# Patient Record
Sex: Female | Born: 1975 | Race: White | Hispanic: No | Marital: Married | Smoking: Former smoker
Health system: Southern US, Community
[De-identification: ages and names within clinical notes are randomized; demographics above are authoritative.]

## PROBLEM LIST (undated history)

## (undated) DIAGNOSIS — F329 Major depressive disorder, single episode, unspecified: Secondary | ICD-10-CM

## (undated) DIAGNOSIS — F32A Depression, unspecified: Secondary | ICD-10-CM

## (undated) DIAGNOSIS — Z973 Presence of spectacles and contact lenses: Secondary | ICD-10-CM

## (undated) DIAGNOSIS — R51 Headache: Secondary | ICD-10-CM

## (undated) DIAGNOSIS — F419 Anxiety disorder, unspecified: Secondary | ICD-10-CM

## (undated) DIAGNOSIS — M549 Dorsalgia, unspecified: Secondary | ICD-10-CM

## (undated) DIAGNOSIS — I498 Other specified cardiac arrhythmias: Secondary | ICD-10-CM

## (undated) DIAGNOSIS — I441 Atrioventricular block, second degree: Secondary | ICD-10-CM

## (undated) DIAGNOSIS — M797 Fibromyalgia: Secondary | ICD-10-CM

## (undated) DIAGNOSIS — G510 Bell's palsy: Secondary | ICD-10-CM

## (undated) DIAGNOSIS — R519 Headache, unspecified: Secondary | ICD-10-CM

## (undated) DIAGNOSIS — K219 Gastro-esophageal reflux disease without esophagitis: Secondary | ICD-10-CM

## (undated) DIAGNOSIS — R569 Unspecified convulsions: Secondary | ICD-10-CM

## (undated) DIAGNOSIS — G8929 Other chronic pain: Secondary | ICD-10-CM

## (undated) DIAGNOSIS — R001 Bradycardia, unspecified: Secondary | ICD-10-CM

## (undated) DIAGNOSIS — Z8719 Personal history of other diseases of the digestive system: Secondary | ICD-10-CM

## (undated) HISTORY — DX: Other specified cardiac arrhythmias: I49.8

## (undated) HISTORY — DX: Bradycardia, unspecified: R00.1

## (undated) HISTORY — PX: TOOTH EXTRACTION: SUR596

## (undated) HISTORY — DX: Atrioventricular block, second degree: I44.1

## (undated) HISTORY — PX: DILATION AND CURETTAGE OF UTERUS: SHX78

---

## 1999-07-01 ENCOUNTER — Other Ambulatory Visit: Admission: RE | Admit: 1999-07-01 | Discharge: 1999-07-01 | Payer: Self-pay | Admitting: Obstetrics and Gynecology

## 1999-09-03 ENCOUNTER — Inpatient Hospital Stay (HOSPITAL_COMMUNITY): Admission: AD | Admit: 1999-09-03 | Discharge: 1999-09-05 | Payer: Self-pay | Admitting: Obstetrics and Gynecology

## 1999-09-06 ENCOUNTER — Encounter (HOSPITAL_COMMUNITY): Admission: RE | Admit: 1999-09-06 | Discharge: 1999-12-05 | Payer: Self-pay | Admitting: Obstetrics and Gynecology

## 1999-12-07 ENCOUNTER — Encounter: Admission: RE | Admit: 1999-12-07 | Discharge: 2000-04-29 | Payer: Self-pay | Admitting: Obstetrics and Gynecology

## 2000-05-05 ENCOUNTER — Encounter: Admission: RE | Admit: 2000-05-05 | Discharge: 2000-08-03 | Payer: Self-pay | Admitting: Obstetrics and Gynecology

## 2000-08-05 ENCOUNTER — Encounter: Admission: RE | Admit: 2000-08-05 | Discharge: 2000-11-03 | Payer: Self-pay | Admitting: Obstetrics and Gynecology

## 2000-11-05 ENCOUNTER — Encounter: Admission: RE | Admit: 2000-11-05 | Discharge: 2001-01-04 | Payer: Self-pay | Admitting: Obstetrics and Gynecology

## 2001-07-11 ENCOUNTER — Emergency Department (HOSPITAL_COMMUNITY): Admission: EM | Admit: 2001-07-11 | Discharge: 2001-07-11 | Payer: Self-pay | Admitting: Emergency Medicine

## 2001-07-27 ENCOUNTER — Other Ambulatory Visit: Admission: RE | Admit: 2001-07-27 | Discharge: 2001-07-27 | Payer: Self-pay | Admitting: Gynecology

## 2003-05-08 ENCOUNTER — Encounter: Payer: Self-pay | Admitting: Family Medicine

## 2003-05-08 ENCOUNTER — Ambulatory Visit (HOSPITAL_COMMUNITY): Admission: RE | Admit: 2003-05-08 | Discharge: 2003-05-08 | Payer: Self-pay | Admitting: Family Medicine

## 2003-05-20 ENCOUNTER — Encounter: Payer: Self-pay | Admitting: Family Medicine

## 2003-05-20 ENCOUNTER — Ambulatory Visit (HOSPITAL_COMMUNITY): Admission: RE | Admit: 2003-05-20 | Discharge: 2003-05-20 | Payer: Self-pay | Admitting: Family Medicine

## 2004-10-01 ENCOUNTER — Ambulatory Visit (HOSPITAL_COMMUNITY): Admission: RE | Admit: 2004-10-01 | Discharge: 2004-10-01 | Payer: Self-pay | Admitting: Family Medicine

## 2005-01-12 ENCOUNTER — Encounter (HOSPITAL_COMMUNITY): Admission: RE | Admit: 2005-01-12 | Discharge: 2005-02-11 | Payer: Self-pay | Admitting: Neurosurgery

## 2005-04-30 ENCOUNTER — Ambulatory Visit (HOSPITAL_COMMUNITY): Admission: RE | Admit: 2005-04-30 | Discharge: 2005-04-30 | Payer: Self-pay | Admitting: Family Medicine

## 2007-04-07 ENCOUNTER — Ambulatory Visit (HOSPITAL_COMMUNITY): Admission: RE | Admit: 2007-04-07 | Discharge: 2007-04-07 | Payer: Self-pay | Admitting: Unknown Physician Specialty
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
## 2012-03-03 ENCOUNTER — Other Ambulatory Visit (HOSPITAL_COMMUNITY): Payer: Self-pay | Admitting: Family Medicine

## 2012-03-03 ENCOUNTER — Ambulatory Visit (HOSPITAL_COMMUNITY)
Admission: RE | Admit: 2012-03-03 | Discharge: 2012-03-03 | Disposition: A | Discharge status: Home / Self Care | Payer: BC Managed Care – PPO | Source: Ambulatory Visit | Attending: Family Medicine | Admitting: Family Medicine

## 2012-03-03 DIAGNOSIS — M545 Low back pain, unspecified: Secondary | ICD-10-CM

## 2013-03-22 ENCOUNTER — Other Ambulatory Visit (HOSPITAL_COMMUNITY): Payer: Self-pay | Admitting: Family Medicine

## 2013-03-22 DIAGNOSIS — M545 Low back pain, unspecified: Secondary | ICD-10-CM

## 2013-03-23 ENCOUNTER — Ambulatory Visit (HOSPITAL_COMMUNITY)
Admission: RE | Admit: 2013-03-23 | Discharge: 2013-03-23 | Disposition: A | Discharge status: Home / Self Care | Payer: BC Managed Care – PPO | Source: Ambulatory Visit | Attending: Family Medicine | Admitting: Family Medicine

## 2013-03-23 DIAGNOSIS — M545 Low back pain, unspecified: Secondary | ICD-10-CM | POA: Insufficient documentation

## 2013-03-23 DIAGNOSIS — M5126 Other intervertebral disc displacement, lumbar region: Secondary | ICD-10-CM | POA: Insufficient documentation

## 2013-03-23 DIAGNOSIS — R209 Unspecified disturbances of skin sensation: Secondary | ICD-10-CM | POA: Insufficient documentation

## 2013-03-23 DIAGNOSIS — M79609 Pain in unspecified limb: Secondary | ICD-10-CM | POA: Insufficient documentation

## 2013-03-23 DIAGNOSIS — M5380 Other specified dorsopathies, site unspecified: Secondary | ICD-10-CM | POA: Insufficient documentation

## 2013-03-26 ENCOUNTER — Other Ambulatory Visit: Payer: Self-pay | Admitting: Neurosurgery

## 2013-03-26 ENCOUNTER — Encounter (HOSPITAL_COMMUNITY): Payer: Self-pay | Admitting: Pharmacy Technician

## 2013-03-29 ENCOUNTER — Encounter (HOSPITAL_COMMUNITY): Payer: Self-pay

## 2013-03-29 ENCOUNTER — Encounter (HOSPITAL_COMMUNITY)
Admission: RE | Admit: 2013-03-29 | Discharge: 2013-03-29 | Disposition: A | Discharge status: Home / Self Care | Payer: BC Managed Care – PPO | Source: Ambulatory Visit | Attending: Neurosurgery | Admitting: Neurosurgery

## 2013-03-29 HISTORY — DX: Gastro-esophageal reflux disease without esophagitis: K21.9

## 2013-03-29 HISTORY — DX: Anxiety disorder, unspecified: F41.9

## 2013-03-29 HISTORY — DX: Major depressive disorder, single episode, unspecified: F32.9

## 2013-03-29 HISTORY — DX: Depression, unspecified: F32.A

## 2013-03-29 LAB — CBC
HCT: 40.3 % (ref 36.0–46.0)
Hemoglobin: 13.9 g/dL (ref 12.0–15.0)
MCH: 32.4 pg (ref 26.0–34.0)
RBC: 4.29 MIL/uL (ref 3.87–5.11)

## 2013-03-29 LAB — HCG, SERUM, QUALITATIVE: Preg, Serum: NEGATIVE

## 2013-03-29 LAB — SURGICAL PCR SCREEN: MRSA, PCR: NEGATIVE

## 2013-03-29 NOTE — Pre-Procedure Instructions (Signed)
ARIYANNA OIEN  03/29/2013   Your procedure is scheduled on:  Friday, June 20th  Report to [facility] Stay [facility] at 0530 AM. Come to main entrance "A" and go to east elevators up to 3rd floor. Check in at short stay desk.  Call this number if you have problems the morning of surgery: [phone]   Remember:   Do not eat food or drink liquids after midnight.   Take these medicines the morning of surgery with A SIP OF WATER: xanax if needed, oxycodone if needed   Do not wear jewelry, make-up or nail polish.  Do not wear lotions, powders, or perfumes, deodorant.  Do not shave 48 hours prior to surgery. Men may shave face and neck.  Do not bring valuables to the hospital.  [facility] is not responsible for any belongings or valuables.  Contacts, dentures or bridgework may not be worn into surgery.  Leave suitcase in the car. After surgery it may be brought to your room.  For patients admitted to the hospital, checkout time is 11:00 AM the day of discharge.   Patients discharged the day of surgery will not be allowed to drive home.    Special Instructions: Shower using CHG 2 nights before surgery and the night before surgery.  If you shower the day of surgery use CHG.  Use special wash - you have one bottle of CHG for all showers.  You should use approximately 1/3 of the bottle for each shower.   Please read over the following fact sheets that you were given: Pain Booklet, Coughing and Deep Breathing, MRSA Information and Surgical Site Infection Prevention

## 2013-03-29 NOTE — Progress Notes (Signed)
Primary physician - dr. Michelle Nasuti - [place] No cardiologist No prior cardiac testing

## 2013-03-30 ENCOUNTER — Encounter (HOSPITAL_COMMUNITY): Payer: Self-pay | Admitting: *Deleted

## 2013-03-30 ENCOUNTER — Ambulatory Visit (HOSPITAL_COMMUNITY): Payer: BC Managed Care – PPO | Admitting: Anesthesiology

## 2013-03-30 ENCOUNTER — Observation Stay (HOSPITAL_COMMUNITY)
Admission: RE | Admit: 2013-03-30 | Discharge: 2013-03-31 | Disposition: A | Discharge status: Home / Self Care | Payer: BC Managed Care – PPO | Source: Ambulatory Visit | Attending: Neurosurgery | Admitting: Neurosurgery

## 2013-03-30 ENCOUNTER — Encounter (HOSPITAL_COMMUNITY)
Admission: RE | Disposition: A | Discharge status: Home / Self Care | Payer: Self-pay | Source: Ambulatory Visit | Attending: Neurosurgery

## 2013-03-30 ENCOUNTER — Ambulatory Visit (HOSPITAL_COMMUNITY): Admission: RE | Admit: 2013-03-30 | Payer: BC Managed Care – PPO | Source: Ambulatory Visit | Admitting: Neurosurgery

## 2013-03-30 ENCOUNTER — Encounter (HOSPITAL_COMMUNITY): Payer: Self-pay | Admitting: Anesthesiology

## 2013-03-30 ENCOUNTER — Encounter (HOSPITAL_COMMUNITY): Admission: RE | Payer: Self-pay | Source: Ambulatory Visit

## 2013-03-30 ENCOUNTER — Ambulatory Visit (HOSPITAL_COMMUNITY): Payer: BC Managed Care – PPO

## 2013-03-30 DIAGNOSIS — Z01812 Encounter for preprocedural laboratory examination: Secondary | ICD-10-CM | POA: Insufficient documentation

## 2013-03-30 DIAGNOSIS — Z79899 Other long term (current) drug therapy: Secondary | ICD-10-CM | POA: Insufficient documentation

## 2013-03-30 DIAGNOSIS — M51379 Other intervertebral disc degeneration, lumbosacral region without mention of lumbar back pain or lower extremity pain: Secondary | ICD-10-CM | POA: Insufficient documentation

## 2013-03-30 DIAGNOSIS — M47817 Spondylosis without myelopathy or radiculopathy, lumbosacral region: Secondary | ICD-10-CM | POA: Insufficient documentation

## 2013-03-30 DIAGNOSIS — M5126 Other intervertebral disc displacement, lumbar region: Principal | ICD-10-CM | POA: Insufficient documentation

## 2013-03-30 DIAGNOSIS — M5137 Other intervertebral disc degeneration, lumbosacral region: Secondary | ICD-10-CM | POA: Insufficient documentation

## 2013-03-30 HISTORY — DX: Bell's palsy: G51.0

## 2013-03-30 HISTORY — PX: LUMBAR LAMINECTOMY/DECOMPRESSION MICRODISCECTOMY: SHX5026

## 2013-03-30 SURGERY — LUMBAR LAMINECTOMY/DECOMPRESSION MICRODISCECTOMY 1 LEVEL
Anesthesia: General | Site: Back | Laterality: Bilateral | Wound class: Clean

## 2013-03-30 SURGERY — LUMBAR LAMINECTOMY/DECOMPRESSION MICRODISCECTOMY 1 LEVEL
Anesthesia: General | Laterality: Bilateral

## 2013-03-30 MED ORDER — OXYCODONE HCL 5 MG PO TABS
ORAL_TABLET | ORAL | Status: AC
  Filled 2013-03-30: qty 1

## 2013-03-30 MED ORDER — LACTATED RINGERS IV SOLN
INTRAVENOUS | Status: DC | PRN
  Administered 2013-03-30 (×2): via INTRAVENOUS

## 2013-03-30 MED ORDER — BACITRACIN 50000 UNITS IM SOLR
INTRAMUSCULAR | Status: AC
  Filled 2013-03-30: qty 1

## 2013-03-30 MED ORDER — ALPRAZOLAM 0.5 MG PO TABS: 0.5000 mg | ORAL_TABLET | Freq: Three times a day (TID) | ORAL | Status: DC | PRN

## 2013-03-30 MED ORDER — MAGNESIUM HYDROXIDE 400 MG/5ML PO SUSP: 30.0000 mL | Freq: Every day | ORAL | Status: DC | PRN

## 2013-03-30 MED ORDER — SODIUM CHLORIDE 0.9 % IV SOLN
INTRAVENOUS | Status: AC
  Filled 2013-03-30: qty 500

## 2013-03-30 MED ORDER — ACETAMINOPHEN 650 MG RE SUPP: 650.0000 mg | RECTAL | Status: DC | PRN

## 2013-03-30 MED ORDER — OXYCODONE HCL 5 MG PO TABS
5.0000 mg | ORAL_TABLET | Freq: Once | ORAL | Status: AC | PRN
  Administered 2013-03-30: 5 mg via ORAL

## 2013-03-30 MED ORDER — ROCURONIUM BROMIDE 100 MG/10ML IV SOLN
INTRAVENOUS | Status: DC | PRN
  Administered 2013-03-30: 50 mg via INTRAVENOUS

## 2013-03-30 MED ORDER — ACETAMINOPHEN 325 MG PO TABS: 650.0000 mg | ORAL_TABLET | ORAL | Status: DC | PRN

## 2013-03-30 MED ORDER — BUPIVACAINE HCL (PF) 0.5 % IJ SOLN
INTRAMUSCULAR | Status: DC | PRN
  Administered 2013-03-30: 10 mL

## 2013-03-30 MED ORDER — MORPHINE SULFATE 4 MG/ML IJ SOLN
4.0000 mg | INTRAMUSCULAR | Status: DC | PRN
  Administered 2013-03-30 (×2): 4 mg via INTRAMUSCULAR
  Filled 2013-03-30 (×2): qty 1

## 2013-03-30 MED ORDER — SODIUM CHLORIDE 0.9 % IJ SOLN: 3.0000 mL | INTRAMUSCULAR | Status: DC | PRN

## 2013-03-30 MED ORDER — LIDOCAINE-EPINEPHRINE 1 %-1:100000 IJ SOLN
INTRAMUSCULAR | Status: DC | PRN
  Administered 2013-03-30: 10 mL

## 2013-03-30 MED ORDER — HYDROXYZINE HCL 25 MG PO TABS: 50.0000 mg | ORAL_TABLET | ORAL | Status: DC | PRN

## 2013-03-30 MED ORDER — ACETAMINOPHEN 10 MG/ML IV SOLN
INTRAVENOUS | Status: AC
  Administered 2013-03-30: 1000 mg via INTRAVENOUS
  Filled 2013-03-30: qty 100

## 2013-03-30 MED ORDER — ONDANSETRON HCL 4 MG/2ML IJ SOLN
INTRAMUSCULAR | Status: DC | PRN
  Administered 2013-03-30 (×2): 4 mg via INTRAVENOUS

## 2013-03-30 MED ORDER — KCL IN DEXTROSE-NACL 20-5-0.45 MEQ/L-%-% IV SOLN
INTRAVENOUS | Status: DC
  Filled 2013-03-30 (×5): qty 1000

## 2013-03-30 MED ORDER — 0.9 % SODIUM CHLORIDE (POUR BTL) OPTIME
TOPICAL | Status: DC | PRN
  Administered 2013-03-30: 1000 mL

## 2013-03-30 MED ORDER — PROPOFOL INFUSION 10 MG/ML OPTIME
INTRAVENOUS | Status: DC | PRN
  Administered 2013-03-30: 25 ug/kg/min via INTRAVENOUS

## 2013-03-30 MED ORDER — ALUM & MAG HYDROXIDE-SIMETH 200-200-20 MG/5ML PO SUSP: 30.0000 mL | Freq: Four times a day (QID) | ORAL | Status: DC | PRN

## 2013-03-30 MED ORDER — ACETAMINOPHEN 10 MG/ML IV SOLN
INTRAVENOUS | Status: AC
  Filled 2013-03-30: qty 100

## 2013-03-30 MED ORDER — CYCLOBENZAPRINE HCL 10 MG PO TABS
10.0000 mg | ORAL_TABLET | Freq: Three times a day (TID) | ORAL | Status: DC | PRN
  Administered 2013-03-30 (×2): 10 mg via ORAL
  Filled 2013-03-30 (×2): qty 1

## 2013-03-30 MED ORDER — OXYCODONE HCL 5 MG/5ML PO SOLN: 5.0000 mg | Freq: Once | ORAL | Status: AC | PRN

## 2013-03-30 MED ORDER — KETOROLAC TROMETHAMINE 30 MG/ML IJ SOLN
30.0000 mg | Freq: Four times a day (QID) | INTRAMUSCULAR | Status: DC
  Administered 2013-03-30 – 2013-03-31 (×4): 30 mg via INTRAVENOUS
  Filled 2013-03-30 (×8): qty 1

## 2013-03-30 MED ORDER — SALINE SPRAY 0.65 % NA SOLN
2.0000 | NASAL | Status: DC | PRN
  Filled 2013-03-30: qty 44

## 2013-03-30 MED ORDER — TEMAZEPAM 15 MG PO CAPS
30.0000 mg | ORAL_CAPSULE | Freq: Every evening | ORAL | Status: DC | PRN
  Administered 2013-03-30: 30 mg via ORAL
  Filled 2013-03-30: qty 2

## 2013-03-30 MED ORDER — METHYLPREDNISOLONE ACETATE 80 MG/ML IJ SUSP
INTRAMUSCULAR | Status: DC | PRN
  Administered 2013-03-30: 80 mg

## 2013-03-30 MED ORDER — SODIUM CHLORIDE 0.9 % IJ SOLN
3.0000 mL | Freq: Two times a day (BID) | INTRAMUSCULAR | Status: DC
  Administered 2013-03-30 – 2013-03-31 (×2): 3 mL via INTRAVENOUS

## 2013-03-30 MED ORDER — SODIUM CHLORIDE 0.9 % IR SOLN
Status: DC | PRN
  Administered 2013-03-30: 08:00:00

## 2013-03-30 MED ORDER — PROMETHAZINE HCL 25 MG/ML IJ SOLN: 6.2500 mg | INTRAMUSCULAR | Status: DC | PRN

## 2013-03-30 MED ORDER — HYDROMORPHONE HCL PF 1 MG/ML IJ SOLN
0.2500 mg | INTRAMUSCULAR | Status: DC | PRN
  Administered 2013-03-30 (×4): 0.5 mg via INTRAVENOUS

## 2013-03-30 MED ORDER — NEOSTIGMINE METHYLSULFATE 1 MG/ML IJ SOLN
INTRAMUSCULAR | Status: DC | PRN
  Administered 2013-03-30: 3 mg via INTRAVENOUS

## 2013-03-30 MED ORDER — HYDROMORPHONE HCL PF 1 MG/ML IJ SOLN
INTRAMUSCULAR | Status: AC
  Filled 2013-03-30: qty 1

## 2013-03-30 MED ORDER — REWETTING DROPS SOLN: 2.0000 [drp] | Freq: Every day | Status: DC | PRN

## 2013-03-30 MED ORDER — PROPOFOL 10 MG/ML IV BOLUS
INTRAVENOUS | Status: DC | PRN
  Administered 2013-03-30: 200 mg via INTRAVENOUS

## 2013-03-30 MED ORDER — FENTANYL CITRATE 0.05 MG/ML IJ SOLN
INTRAMUSCULAR | Status: DC | PRN
  Administered 2013-03-30 (×3): 50 ug via INTRAVENOUS
  Administered 2013-03-30: 100 ug via INTRAVENOUS

## 2013-03-30 MED ORDER — FENTANYL CITRATE 0.05 MG/ML IJ SOLN
INTRAMUSCULAR | Status: AC
  Filled 2013-03-30: qty 2

## 2013-03-30 MED ORDER — CARISOPRODOL 350 MG PO TABS
350.0000 mg | ORAL_TABLET | Freq: Every evening | ORAL | Status: DC | PRN
  Administered 2013-03-30: 350 mg via ORAL
  Filled 2013-03-30: qty 1

## 2013-03-30 MED ORDER — ACETAMINOPHEN 10 MG/ML IV SOLN
1000.0000 mg | Freq: Four times a day (QID) | INTRAVENOUS | Status: DC
  Administered 2013-03-30 – 2013-03-31 (×3): 1000 mg via INTRAVENOUS
  Filled 2013-03-30 (×4): qty 100

## 2013-03-30 MED ORDER — MIDAZOLAM HCL 2 MG/2ML IJ SOLN: 1.0000 mg | INTRAMUSCULAR | Status: DC | PRN

## 2013-03-30 MED ORDER — KETOROLAC TROMETHAMINE 30 MG/ML IJ SOLN
INTRAMUSCULAR | Status: AC
  Filled 2013-03-30: qty 1

## 2013-03-30 MED ORDER — ONDANSETRON HCL 4 MG/2ML IJ SOLN: 4.0000 mg | Freq: Four times a day (QID) | INTRAMUSCULAR | Status: DC | PRN

## 2013-03-30 MED ORDER — MENTHOL 3 MG MT LOZG: 1.0000 | LOZENGE | OROMUCOSAL | Status: DC | PRN

## 2013-03-30 MED ORDER — SALINE NASAL SPRAY 0.65 % NA SOLN: 2.0000 | Freq: Every day | NASAL | Status: DC | PRN

## 2013-03-30 MED ORDER — LIDOCAINE HCL (CARDIAC) 20 MG/ML IV SOLN
INTRAVENOUS | Status: DC | PRN
  Administered 2013-03-30: 100 mg via INTRAVENOUS

## 2013-03-30 MED ORDER — FENTANYL CITRATE 0.05 MG/ML IJ SOLN
INTRAMUSCULAR | Status: DC | PRN
  Administered 2013-03-30: 50 ug via INTRAVENOUS

## 2013-03-30 MED ORDER — KETOROLAC TROMETHAMINE 30 MG/ML IJ SOLN
30.0000 mg | Freq: Once | INTRAMUSCULAR | Status: AC
  Administered 2013-03-30: 30 mg via INTRAVENOUS

## 2013-03-30 MED ORDER — FENTANYL CITRATE 0.05 MG/ML IJ SOLN: 50.0000 ug | Freq: Once | INTRAMUSCULAR | Status: DC

## 2013-03-30 MED ORDER — DEXAMETHASONE SODIUM PHOSPHATE 4 MG/ML IJ SOLN
INTRAMUSCULAR | Status: DC | PRN
  Administered 2013-03-30: 8 mg via INTRAVENOUS

## 2013-03-30 MED ORDER — OXYCODONE HCL 5 MG PO TABS
5.0000 mg | ORAL_TABLET | ORAL | Status: DC | PRN
  Administered 2013-03-30 – 2013-03-31 (×3): 10 mg via ORAL
  Filled 2013-03-30 (×3): qty 2

## 2013-03-30 MED ORDER — THROMBIN 20000 UNITS EX SOLR
CUTANEOUS | Status: DC | PRN
  Administered 2013-03-30: 08:00:00 via TOPICAL

## 2013-03-30 MED ORDER — MIDAZOLAM HCL 5 MG/5ML IJ SOLN
INTRAMUSCULAR | Status: DC | PRN
  Administered 2013-03-30 (×2): 2 mg via INTRAVENOUS

## 2013-03-30 MED ORDER — AMPHETAMINE-DEXTROAMPHETAMINE 10 MG PO TABS: 20.0000 mg | ORAL_TABLET | Freq: Two times a day (BID) | ORAL | Status: DC | PRN

## 2013-03-30 MED ORDER — GLYCOPYRROLATE 0.2 MG/ML IJ SOLN
INTRAMUSCULAR | Status: DC | PRN
  Administered 2013-03-30: 0.4 mg via INTRAVENOUS

## 2013-03-30 MED ORDER — BISACODYL 10 MG RE SUPP: 10.0000 mg | Freq: Every day | RECTAL | Status: DC | PRN

## 2013-03-30 MED ORDER — HYDROMORPHONE HCL PF 1 MG/ML IJ SOLN
INTRAMUSCULAR | Status: DC | PRN
  Administered 2013-03-30: 1 mg via INTRAVENOUS

## 2013-03-30 MED ORDER — PHENOL 1.4 % MT LIQD: 1.0000 | OROMUCOSAL | Status: DC | PRN

## 2013-03-30 MED ORDER — CEFAZOLIN SODIUM-DEXTROSE 2-3 GM-% IV SOLR
2.0000 g | INTRAVENOUS | Status: AC
  Administered 2013-03-30: 2 g via INTRAVENOUS

## 2013-03-30 SURGICAL SUPPLY — 60 items
BAG DECANTER FOR FLEXI CONT (MISCELLANEOUS) ×2 IMPLANT
BENZOIN TINCTURE PRP APPL 2/3 (GAUZE/BANDAGES/DRESSINGS) IMPLANT
BLADE SURG ROTATE 9660 (MISCELLANEOUS) IMPLANT
BRUSH SCRUB EZ PLAIN DRY (MISCELLANEOUS) ×2 IMPLANT
BUR ACORN 6.0 ACORN (BURR) IMPLANT
BUR ACRON 5.0MM COATED (BURR) IMPLANT
BUR MATCHSTICK NEURO 3.0 LAGG (BURR) ×2 IMPLANT
CANISTER SUCTION 2500CC (MISCELLANEOUS) ×2 IMPLANT
CLOTH BEACON ORANGE TIMEOUT ST (SAFETY) ×2 IMPLANT
CONT SPEC 4OZ CLIKSEAL STRL BL (MISCELLANEOUS) IMPLANT
DERMABOND ADHESIVE PROPEN (GAUZE/BANDAGES/DRESSINGS) ×1
DERMABOND ADVANCED (GAUZE/BANDAGES/DRESSINGS)
DERMABOND ADVANCED .7 DNX12 (GAUZE/BANDAGES/DRESSINGS) IMPLANT
DERMABOND ADVANCED .7 DNX6 (GAUZE/BANDAGES/DRESSINGS) ×1 IMPLANT
DRAPE LAPAROTOMY 100X72X124 (DRAPES) ×2 IMPLANT
DRAPE MICROSCOPE LEICA (MISCELLANEOUS) ×2 IMPLANT
DRAPE POUCH INSTRU U-SHP 10X18 (DRAPES) ×2 IMPLANT
DRAPE PROXIMA HALF (DRAPES) ×4 IMPLANT
DRSG EMULSION OIL 3X3 NADH (GAUZE/BANDAGES/DRESSINGS) IMPLANT
ELECT REM PT RETURN 9FT ADLT (ELECTROSURGICAL) ×2
ELECTRODE REM PT RTRN 9FT ADLT (ELECTROSURGICAL) ×1 IMPLANT
GAUZE SPONGE 4X4 16PLY XRAY LF (GAUZE/BANDAGES/DRESSINGS) IMPLANT
GLOVE BIOGEL PI IND STRL 7.0 (GLOVE) ×2 IMPLANT
GLOVE BIOGEL PI IND STRL 8 (GLOVE) ×1 IMPLANT
GLOVE BIOGEL PI INDICATOR 7.0 (GLOVE) ×2
GLOVE BIOGEL PI INDICATOR 8 (GLOVE) ×1
GLOVE ECLIPSE 7.5 STRL STRAW (GLOVE) ×2 IMPLANT
GLOVE EXAM NITRILE LRG STRL (GLOVE) IMPLANT
GLOVE EXAM NITRILE MD LF STRL (GLOVE) IMPLANT
GLOVE EXAM NITRILE XL STR (GLOVE) IMPLANT
GLOVE EXAM NITRILE XS STR PU (GLOVE) IMPLANT
GLOVE SURG SS PI 7.0 STRL IVOR (GLOVE) ×2 IMPLANT
GOWN BRE IMP SLV AUR LG STRL (GOWN DISPOSABLE) IMPLANT
GOWN BRE IMP SLV AUR XL STRL (GOWN DISPOSABLE) ×4 IMPLANT
GOWN STRL REIN 2XL LVL4 (GOWN DISPOSABLE) ×2 IMPLANT
KIT BASIN OR (CUSTOM PROCEDURE TRAY) ×2 IMPLANT
KIT ROOM TURNOVER OR (KITS) ×2 IMPLANT
NEEDLE HYPO 18GX1.5 BLUNT FILL (NEEDLE) ×2 IMPLANT
NEEDLE SPNL 18GX3.5 QUINCKE PK (NEEDLE) ×2 IMPLANT
NEEDLE SPNL 22GX3.5 QUINCKE BK (NEEDLE) ×2 IMPLANT
NS IRRIG 1000ML POUR BTL (IV SOLUTION) ×2 IMPLANT
PACK LAMINECTOMY NEURO (CUSTOM PROCEDURE TRAY) ×2 IMPLANT
PAD ARMBOARD 7.5X6 YLW CONV (MISCELLANEOUS) ×6 IMPLANT
PATTIES SURGICAL .5 X1 (DISPOSABLE) ×2 IMPLANT
RUBBERBAND STERILE (MISCELLANEOUS) ×4 IMPLANT
SPONGE GAUZE 4X4 12PLY (GAUZE/BANDAGES/DRESSINGS) ×2 IMPLANT
SPONGE LAP 4X18 X RAY DECT (DISPOSABLE) IMPLANT
SPONGE SURGIFOAM ABS GEL SZ50 (HEMOSTASIS) ×2 IMPLANT
STRIP CLOSURE SKIN 1/2X4 (GAUZE/BANDAGES/DRESSINGS) IMPLANT
SUT PROLENE 6 0 BV (SUTURE) IMPLANT
SUT VIC AB 1 CT1 18XBRD ANBCTR (SUTURE) ×1 IMPLANT
SUT VIC AB 1 CT1 8-18 (SUTURE) ×1
SUT VIC AB 2-0 CP2 18 (SUTURE) ×2 IMPLANT
SUT VIC AB 3-0 SH 8-18 (SUTURE) IMPLANT
SYR 20ML ECCENTRIC (SYRINGE) ×2 IMPLANT
SYR 5ML LL (SYRINGE) IMPLANT
TAPE CLOTH SURG 4X10 WHT LF (GAUZE/BANDAGES/DRESSINGS) ×2 IMPLANT
TOWEL OR 17X24 6PK STRL BLUE (TOWEL DISPOSABLE) ×2 IMPLANT
TOWEL OR 17X26 10 PK STRL BLUE (TOWEL DISPOSABLE) ×2 IMPLANT
WATER STERILE IRR 1000ML POUR (IV SOLUTION) ×2 IMPLANT

## 2013-03-30 NOTE — Op Note (Signed)
03/30/2013  9:11 AM  PATIENT:  Mckenzie Baker  37 y.o. 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, lumbar degenerative disc disease, lumbar spondylosis, lumbar radiculopathy  POST-OPERATIVE DIAGNOSIS:  L4-5 lumbar herniated disc, lumbar degenerative disc disease, lumbar spondylosis, lumbar radiculopathy  PROCEDURE:  Procedure(s): LUMBAR LAMINECTOMY/DECOMPRESSION MICRODISCECTOMY 1 LEVEL: Bilateral L4-5 lumbar laminotomy and microdiscectomy, with microdissection, microsurgical technique, and the operating microscope  SURGEON:  Surgeon(s): Hewitt Shorts, MD  ANESTHESIA:   general  EBL:  Total I/O In: 1000 [I.V.:1000] Out: 100 [Blood:100]  BLOOD ADMINISTERED:none  COUNT: Correct per nursing staff  DICTATION: Patient was brought to the operating room, placed under general endotracheal anesthesia. Patient was turned to a prone position, the lumbar region was prepped with Betadine soap and solution and draped in a sterile fashion. An x-ray was taken and the L4-5 level identified, the midline was infiltrated with local anesthetic with epinephrine. Midline incision made over the L4-5 level, and carried down to subcutaneous tissue. Bipolar cautery and electrocautery were used to maintain hemostasis. The lumbar fascia was incised bilaterally, and the paraspinal musculature elevated with a subperiosteal dissection. Self-retaining retractor was placed in another x-ray was taken and the L4-5 level was confirmed. The operating microscope was draped, brought in the field, provided additional magnification, illumination, and visualization, and the remainder the procedure was performed using microdissection and microsurgical technique. Bilateral laminotomy was performed using the high-speed drill and Kerrison punches. Ligamentum flavum was carefully removed bilaterally, and we identified the thecal sac and exiting L5 nerve roots bilaterally. Weeks for the ventral aspect of the epidural  space bilaterally. A combination of free fragment as well as subligamentous disc herniation was encountered, to continue the discectomy the annulus was incised bilaterally and the disc space entered. We proceeded with a thorough discectomy using a variety of pituitary rongeurs, and any and all loose fragments of disc to remove both the disc space and epidural space, and good decompression of the thecal sac and nerve roots was achieved bilaterally. Once the discectomy was completed hemostasis was established with the use of bipolar cautery and Gelfoam with thrombin. The Gelfoam was removed, the wound irrigated with bacitracin solution, hemostasis confirmed, and then we proceeded with closure. Prior to closure 2 cc of fentanyl and 80 mg of Depo-Medrol were instilled into the epidural space. Deep fascia was closed with interrupted undyed 1 Vicryl sutures, subcutaneous and subcuticular closed interrupted inverted 2-0 undyed Vicryl sutures. Skin is approximate Dermabond. Following surgery the patient is to be turned back to a supine position, reversed from the anesthetic, extubated, and transferred to the recovery further care.  PLAN OF CARE: Admit for overnight observation  PATIENT DISPOSITION:  PACU - hemodynamically stable.   Delay start of Pharmacological VTE agent (>24hrs) due to surgical blood loss or risk of bleeding:  yes

## 2013-03-30 NOTE — Plan of Care (Signed)
Problem: Consults Goal: Diagnosis - Spinal Surgery Outcome: Completed/Met Date Met:  03/30/13 Lumbar Laminectomy (Complex)

## 2013-03-30 NOTE — Progress Notes (Signed)
UR COMPLETED  

## 2013-03-30 NOTE — Transfer of Care (Signed)
Immediate Anesthesia Transfer of Care Note  Patient: Mckenzie Baker  Procedure(s) Performed: Procedure(s) with comments: LUMBAR LAMINECTOMY/DECOMPRESSION MICRODISCECTOMY 1 LEVEL (Bilateral) - bilateral L45 laminotomy and microdiskectomy  Patient Location: PACU  Anesthesia Type:General  Level of Consciousness: awake, alert  and oriented  Airway & Oxygen Therapy: Patient Spontanous Breathing and Patient connected to nasal cannula oxygen  Post-op Assessment: Report given to PACU RN and Post -op Vital signs reviewed and stable  Post vital signs: Reviewed and stable  Complications: No apparent anesthesia complications

## 2013-03-30 NOTE — H&P (Signed)
Subjective: Patient is a 37 y.o. female who is admitted for treatment of massive L4-5 lumbar disc herniation, with severe disabling low back and bilateral lower extremity pain, extending down into the buttocks, posterior thighs, calves, and feet bilaterally, with numbness and tingling of the legs and feet bilaterally, and a sense of weakness in the distal lower extremities. Patient's symptoms began about a month ago when she slipped and twisted and fell. With the pain she has been falling several times at home. MRI scan was obtained which reveals a massive L4-5 disc herniation, with essentially a complete block at the L4-5 level. Patient is admitted now for bilateral L4-5 lumbar laminotomy and microdiscectomy.   Past Medical History  Diagnosis Date  . Depression   . Anxiety   . GERD (gastroesophageal reflux disease)   . Bell's palsy     History reviewed. No pertinent past surgical history.  Prescriptions prior to admission  Medication Sig Dispense Refill  . ALPRAZolam (XANAX) 0.5 MG tablet Take 0.5 mg by mouth 3 (three) times daily as needed for anxiety.      Marland Kitchen amphetamine-dextroamphetamine (ADDERALL) 20 MG tablet Take 20 mg by mouth 2 (two) times daily as needed (for improving focus).       . carisoprodol (SOMA) 350 MG tablet Take 350 mg by mouth at bedtime as needed for muscle spasms.      . cyclobenzaprine (FLEXERIL) 10 MG tablet Take 10 mg by mouth 3 (three) times daily as needed for muscle spasms.      Marland Kitchen HYDROcodone-acetaminophen (LORTAB) 10-325 MG per tablet Take 1 tablet by mouth every 8 (eight) hours as needed for pain.      Marland Kitchen ibuprofen (ADVIL,MOTRIN) 200 MG tablet Take 400 mg by mouth every 6 (six) hours as needed for pain.      . Liniments (SALONPAS) PADS Apply 1 each topically daily as needed (for pain).      . methylPREDNISolone (MEDROL DOSEPAK) 4 MG tablet Take 4-24 mg by mouth See admin instructions. Takes (# of tablets daily) 6,5,4,3,2,1, then stop.      Marland Kitchen OVER THE COUNTER  MEDICATION Apply 1 application topically daily as needed (for pain). "DMSO 99%" gel      . oxyCODONE (OXYCONTIN) 10 MG 12 hr tablet Take 10 mg by mouth 3 (three) times daily as needed for pain (can take up to 6 times daily as needed).       . sodium chloride (OCEAN) 0.65 % nasal spray Place 2 sprays into the nose daily as needed for congestion.      . Soft Lens Products (REWETTING DROPS) SOLN 2 drops by Does not apply route daily as needed (for contacts).      . temazepam (RESTORIL) 30 MG capsule Take 30 mg by mouth at bedtime as needed for sleep.      Marland Kitchen OVER THE COUNTER MEDICATION Apply 1 application topically daily as needed (for pain). "Soothanol"       No Known Allergies  History  Substance Use Topics  . Smoking status: Current Some Day Smoker -- 0.10 packs/day  . Smokeless tobacco: Not on file  . Alcohol Use: Not on file     Comment: occasional    History reviewed. No pertinent family history.   Review of Systems A comprehensive review of systems was negative.  Objective: Vital signs in last 24 hours: Temp:  [98 F (36.7 C)] 98 F (36.7 C) (06/20 1610) Pulse Rate:  [74-87] 74 (06/20 0608) Resp:  [20] 20 (06/20  5621) BP: (105-129)/(66-90) 105/66 mmHg (06/20 0608) SpO2:  [98 %-99 %] 98 % (06/20 0608) Weight:  [80.151 kg (176 lb 11.2 oz)] 80.151 kg (176 lb 11.2 oz) (06/19 1124)  EXAM: Patient well-developed well-nourished white female in obvious discomfort, but no acute distress. Lungs are clear to auscultation , the patient has symmetrical respiratory excursion. Heart has a regular rate and rhythm normal S1 and S2 no murmur.   Abdomen is soft nontender nondistended bowel sounds are present. Extremity examination shows no clubbing cyanosis or edema. Musculoskeletal examination shows tenderness to palpation in the lumbar region, more to the right side and the left side. Mobility is severely limited in flexion and extension. She develops pain with flexion beyond 5. She has pain with  extension even at 0. Straight leg raising is positive bilaterally at 30-40. Patient has essentially 5/5 strength in the lower extremities, although she has difficulties full effort with the quadriceps, dorsiflexor, plantar flexor, and EHL, because of pain, but I believe strength is probably 5/5. Sensation is decreased to pinprick in the distal lower extremities bilaterally. Reflexes are 1 to quadriceps, absent gastrocnemius, they're symmetrical. Toes are downgoing bilaterally. Gait and stance are very hesitant due to the pain.  Data Review:CBC    Component Value Date/Time   WBC 7.8 03/29/2013 1135   RBC 4.29 03/29/2013 1135   HGB 13.9 03/29/2013 1135   HCT 40.3 03/29/2013 1135   PLT 245 03/29/2013 1135   MCV 93.9 03/29/2013 1135   MCH 32.4 03/29/2013 1135   MCHC 34.5 03/29/2013 1135   RDW 12.5 03/29/2013 1135   Assessment/Plan: Patient with a massive L4-5 lumbar disc herniation, admitted for discectomy.  I've discussed with the patient the nature of his condition, the nature the surgical procedure, the typical length of surgery, hospital stay, and overall recuperation. We discussed limitations postoperatively. I discussed risks of surgery including risks of infection, bleeding, possibly need for transfusion, the risk of nerve root dysfunction with pain, weakness, numbness, or paresthesias, or risk of dural tear and CSF leakage and possible need for further surgery, the risk of recurrent disc herniation and the possible need for further surgery, the risk of bowel and bladder dysfunction, and the risk of anesthetic complications including myocardial infarction, stroke, pneumonia, and death. Understanding all this the patient does wish to proceed with surgery and is admitted for such.    Hewitt Shorts, MD 03/30/2013 6:39 AM

## 2013-03-30 NOTE — Anesthesia Postprocedure Evaluation (Signed)
  Anesthesia Post-op Note  Patient: Mckenzie Baker  Procedure(s) Performed: Procedure(s) with comments: LUMBAR LAMINECTOMY/DECOMPRESSION MICRODISCECTOMY 1 LEVEL (Bilateral) - bilateral L45 laminotomy and microdiskectomy  Patient Location: PACU  Anesthesia Type:General  Level of Consciousness: awake  Airway and Oxygen Therapy: Patient Spontanous Breathing  Post-op Pain: mild  Post-op Assessment: Post-op Vital signs reviewed, Patient's Cardiovascular Status Stable, Respiratory Function Stable and Patent Airway  Post-op Vital Signs: stable  Complications: No apparent anesthesia complications

## 2013-03-30 NOTE — Anesthesia Preprocedure Evaluation (Signed)
Anesthesia Evaluation  Patient identified by MRN, date of birth, ID band Patient awake    Reviewed: Allergy & Precautions, H&P , NPO status   Airway Mallampati: I TM Distance: >3 FB     Dental   Pulmonary Current Smoker,  breath sounds clear to auscultation        Cardiovascular Rhythm:Regular Rate:Normal     Neuro/Psych Anxiety Depression    GI/Hepatic GERD-  ,  Endo/Other    Renal/GU      Musculoskeletal   Abdominal   Peds  Hematology   Anesthesia Other Findings Bells palsy  Reproductive/Obstetrics                           Anesthesia Physical Anesthesia Plan  ASA: II  Anesthesia Plan: General   Post-op Pain Management:    Induction: Intravenous  Airway Management Planned: Oral ETT  Additional Equipment:   Intra-op Plan:   Post-operative Plan: Extubation in OR  Informed Consent: I have reviewed the patients History and Physical, chart, labs and discussed the procedure including the risks, benefits and alternatives for the proposed anesthesia with the patient or authorized representative who has indicated his/her understanding and acceptance.     Plan Discussed with: CRNA and Surgeon  Anesthesia Plan Comments:         Anesthesia Quick Evaluation

## 2013-03-31 MED ORDER — OXYCODONE-ACETAMINOPHEN 5-325 MG PO TABS
1.0000 | ORAL_TABLET | ORAL | Status: DC | PRN
  Administered 2013-03-31: 2 via ORAL
  Filled 2013-03-31: qty 2

## 2013-03-31 NOTE — Progress Notes (Signed)
Patient ID: Mckenzie Baker, female   DOB: 06-Oct-1976, 37 y.o.   MRN: [id number] Is doing well no leg pain back pains well-controlled no new numbness or tingling stable enough for discharge. Strength is 5 out of 5 wound is clean and dry

## 2013-03-31 NOTE — Progress Notes (Signed)
Pt. Alert and oriented,follows simple instructions, denies pain. Incision area without swelling, redness or S/S of infection. Voiding adequate clear yellow urine. Moving all extremities well and vitals stable and documented. Lumbar surgery notes instructions given to patient and family member for home safety and precautions. Pt. and family stated understanding of instructions given. Pain medication given to patient prior to discharged per patient's request.

## 2013-03-31 NOTE — Discharge Summary (Signed)
  Physician Discharge Summary  Patient ID: Mckenzie Baker MRN: [id number] DOB/AGE: October 14, 1975 37 y.o.  Admit date: 03/30/2013 Discharge date: 03/31/2013  Admission Diagnoses: HNP L4-5 same  Discharge Diagnoses:  Active Problems:   * No active hospital problems. *   Discharged Condition: good  Hospital Course: Patient is admitted hospital underwent a lumbar discectomy postoperatively patient did very well with recovered in the floor on the floor she was angling well voiding spontaneously and tolerating a regular diet and was stable and be discharged home.  Consults: Significant Diagnostic Studies: Treatments: L4-5 laminectomy microdiscectomy Discharge Exam: Blood pressure 127/70, pulse 71, temperature 98.9 F (37.2 C), temperature source Oral, resp. rate 18, last menstrual period 03/03/2013, SpO2 97.00%. Strength out of 5 wound clean and dry  Disposition: Home     Medication List    TAKE these medications       ALPRAZolam 0.5 MG tablet  Commonly known as:  XANAX  Take 0.5 mg by mouth 3 (three) times daily as needed for anxiety.     amphetamine-dextroamphetamine 20 MG tablet  Commonly known as:  ADDERALL  Take 20 mg by mouth 2 (two) times daily as needed (for improving focus).     carisoprodol 350 MG tablet  Commonly known as:  SOMA  Take 350 mg by mouth at bedtime as needed for muscle spasms.     cyclobenzaprine 10 MG tablet  Commonly known as:  FLEXERIL  Take 10 mg by mouth 3 (three) times daily as needed for muscle spasms.     ibuprofen 200 MG tablet  Commonly known as:  ADVIL,MOTRIN  Take 400 mg by mouth every 6 (six) hours as needed for pain.     LORTAB 10-325 MG per tablet  Generic drug:  HYDROcodone-acetaminophen  Take 1 tablet by mouth every 8 (eight) hours as needed for pain.     methylPREDNISolone 4 MG tablet  Commonly known as:  MEDROL DOSEPAK  Take 4-24 mg by mouth See admin instructions. Takes (# of tablets daily) 6,5,4,3,2,1, then stop.     OVER  THE COUNTER MEDICATION  Apply 1 application topically daily as needed (for pain). "Soothanol"     OVER THE COUNTER MEDICATION  Apply 1 application topically daily as needed (for pain). "DMSO 99%" gel     oxyCODONE 10 MG 12 hr tablet  Commonly known as:  OXYCONTIN  Take 10 mg by mouth 3 (three) times daily as needed for pain (can take up to 6 times daily as needed).     Rewetting Drops Soln  2 drops by Does not apply route daily as needed (for contacts).     SALONPAS Pads  Apply 1 each topically daily as needed (for pain).     sodium chloride 0.65 % nasal spray  Commonly known as:  OCEAN  Place 2 sprays into the nose daily as needed for congestion.     temazepam 30 MG capsule  Commonly known as:  RESTORIL  Take 30 mg by mouth at bedtime as needed for sleep.           Follow-up Information   Follow up with Hewitt Shorts, MD.   Contact information:   [address] [place] [address] [phone]       Signed: Mariam Dollar 03/31/2013, 7:37 AM

## 2013-04-04 ENCOUNTER — Encounter (HOSPITAL_COMMUNITY): Admission: RE | Payer: Self-pay | Source: Ambulatory Visit

## 2013-04-04 ENCOUNTER — Encounter (HOSPITAL_COMMUNITY): Payer: Self-pay | Admitting: Neurosurgery

## 2013-04-04 ENCOUNTER — Ambulatory Visit (HOSPITAL_COMMUNITY): Admission: RE | Admit: 2013-04-04 | Payer: BC Managed Care – PPO | Source: Ambulatory Visit | Admitting: Neurosurgery

## 2013-04-04 SURGERY — LUMBAR LAMINECTOMY/DECOMPRESSION MICRODISCECTOMY 1 LEVEL
Anesthesia: General | Laterality: Bilateral

## 2013-11-23 ENCOUNTER — Emergency Department (HOSPITAL_COMMUNITY)
Admission: EM | Admit: 2013-11-23 | Discharge: 2013-11-23 | Disposition: A | Discharge status: Home / Self Care | Payer: BC Managed Care – PPO | Attending: Emergency Medicine | Admitting: Emergency Medicine

## 2013-11-23 ENCOUNTER — Encounter (HOSPITAL_COMMUNITY): Payer: Self-pay | Admitting: Emergency Medicine

## 2013-11-23 ENCOUNTER — Emergency Department (HOSPITAL_COMMUNITY): Payer: BC Managed Care – PPO

## 2013-11-23 DIAGNOSIS — F329 Major depressive disorder, single episode, unspecified: Secondary | ICD-10-CM | POA: Insufficient documentation

## 2013-11-23 DIAGNOSIS — F419 Anxiety disorder, unspecified: Secondary | ICD-10-CM

## 2013-11-23 DIAGNOSIS — Z8669 Personal history of other diseases of the nervous system and sense organs: Secondary | ICD-10-CM | POA: Insufficient documentation

## 2013-11-23 DIAGNOSIS — R072 Precordial pain: Secondary | ICD-10-CM | POA: Insufficient documentation

## 2013-11-23 DIAGNOSIS — R079 Chest pain, unspecified: Secondary | ICD-10-CM

## 2013-11-23 DIAGNOSIS — F3289 Other specified depressive episodes: Secondary | ICD-10-CM | POA: Insufficient documentation

## 2013-11-23 DIAGNOSIS — Z8719 Personal history of other diseases of the digestive system: Secondary | ICD-10-CM | POA: Insufficient documentation

## 2013-11-23 DIAGNOSIS — F17200 Nicotine dependence, unspecified, uncomplicated: Secondary | ICD-10-CM | POA: Insufficient documentation

## 2013-11-23 DIAGNOSIS — F411 Generalized anxiety disorder: Secondary | ICD-10-CM | POA: Insufficient documentation

## 2013-11-23 LAB — POCT I-STAT TROPONIN I: TROPONIN I, POC: 0 ng/mL (ref 0.00–0.08)

## 2013-11-23 MED ORDER — LORAZEPAM 1 MG PO TABS
1.0000 mg | ORAL_TABLET | Freq: Once | ORAL | Status: AC
  Administered 2013-11-23: 1 mg via ORAL
  Filled 2013-11-23: qty 1

## 2013-11-23 NOTE — ED Notes (Signed)
Cp since 10 getting progressively worse today pain strated in back between shoulder blades and radiated to chest has been under a lot of stress states feels like past panic attack but worse took her last adderall today given 324 asa and 2 nitro has IV rt ac 20 per ems

## 2013-11-23 NOTE — ED Notes (Signed)
To x-ray

## 2013-11-23 NOTE — ED Provider Notes (Signed)
CSN: [id number]     Arrival date & time 11/23/13  1338 History   First MD Initiated Contact with Patient 11/23/13 1340     Chief Complaint  Patient presents with  . Chest Pain     (Consider location/radiation/quality/duration/timing/severity/associated sxs/prior Treatment) HPI  This a 38 year old female with a history of depression, anxiety who presents with chest pain. Patient reports that she was having an argument with her sister earlier today when she had onset of sternal chest pain that radiated to her back.  The chest pain is nonexertional. Current pain is 5/10. Patient reports that she thinks "this started with a panic attack." She reports increased stressors at home. She is the primary caregiver for her father who is chronically ill and an alcoholic. She also has a sister who was recently hospitalized for a suicide attempt.  Patient denies any suicidal or homicidal ideation at this time. Patient has not taken her anxiety medication today. She is a history of high cholesterol but no history of high blood pressure. She is a current smoker. No known family history.  Past Medical History  Diagnosis Date  . Depression   . Anxiety   . GERD (gastroesophageal reflux disease)   . Bell's palsy    Past Surgical History  Procedure Laterality Date  . Lumbar laminectomy/decompression microdiscectomy Bilateral 03/30/2013    Procedure: LUMBAR LAMINECTOMY/DECOMPRESSION MICRODISCECTOMY 1 LEVEL;  Surgeon: Hewitt Shortsobert W Nudelman, MD;  Location: [facility];  Service: Neurosurgery;  Laterality: Bilateral;  bilateral L45 laminotomy and microdiskectomy   No family history on file. History  Substance Use Topics  . Smoking status: Current Some Day Smoker -- 0.10 packs/day  . Smokeless tobacco: Not on file  . Alcohol Use: Not on file     Comment: occasional   OB History   Grav Para Term Preterm Abortions TAB SAB Ect Mult Living                 Review of Systems  Constitutional: Negative for fever.   Respiratory: Positive for chest tightness. Negative for cough and shortness of breath.   Cardiovascular: Positive for chest pain.  Gastrointestinal: Negative for nausea, vomiting and abdominal pain.  Genitourinary: Negative for dysuria.  Musculoskeletal: Negative for back pain.  Skin: Negative for wound.  Neurological: Negative for headaches.  Psychiatric/Behavioral: The patient is nervous/anxious.   All other systems reviewed and are negative.      Allergies  Review of patient's allergies indicates no known allergies.  Home Medications   Current Outpatient Rx  Name  Route  Sig  Dispense  Refill  . ALPRAZolam (XANAX) 0.5 MG tablet   Oral   Take 0.5 mg by mouth 3 (three) times daily as needed for anxiety.         Marland Kitchen. amphetamine-dextroamphetamine (ADDERALL) 20 MG tablet   Oral   Take 20 mg by mouth 2 (two) times daily as needed (for improving focus).          . carisoprodol (SOMA) 350 MG tablet   Oral   Take 350 mg by mouth at bedtime as needed for muscle spasms.         Marland Kitchen. Liniments (SALONPAS) PADS   Apply externally   Apply 1 each topically daily as needed (for pain).         Marland Kitchen. oxyCODONE-acetaminophen (PERCOCET/ROXICET) 5-325 MG per tablet   Oral   Take 1 tablet by mouth every 4 (four) hours as needed for severe pain.         .Marland Kitchen  sodium chloride (OCEAN) 0.65 % nasal spray   Nasal   Place 2 sprays into the nose daily as needed for congestion.         . temazepam (RESTORIL) 30 MG capsule   Oral   Take 60 mg by mouth at bedtime as needed for sleep.           BP 118/75  Pulse 58  Temp(Src) 97.3 F (36.3 C)  Resp 20  SpO2 100%  LMP 11/09/2013 Physical Exam  Nursing note and vitals reviewed. Constitutional: She is oriented to person, place, and time. No distress.  Tearful, anxious appearing  HENT:  Head: Normocephalic and atraumatic.  Eyes: Pupils are equal, round, and reactive to light.  Neck: Neck supple.  Cardiovascular: Normal rate, regular  rhythm and normal heart sounds.   No murmur heard. Pulmonary/Chest: Effort normal and breath sounds normal. No respiratory distress. She has no wheezes.  Tachypnea  Abdominal: Soft. There is no tenderness.  Musculoskeletal: She exhibits no edema.  Neurological: She is alert and oriented to person, place, and time.  Skin: Skin is warm and dry.  Psychiatric:  Anxious appearing    ED Course  Procedures (including critical care time) Labs Review Labs Reviewed  POCT I-STAT TROPONIN I   Imaging Review Dg Chest 2 View  11/23/2013   CLINICAL DATA:  Chest pain  EXAM: CHEST  2 VIEW  COMPARISON:  None.  FINDINGS: Cardiomediastinal silhouette is unremarkable. No acute infiltrate or pleural effusion. No pulmonary edema. Bony thorax is unremarkable.  IMPRESSION: No active cardiopulmonary disease.   Electronically Signed   By: Natasha Mead M.D.   On: 11/23/2013 15:01    EKG Interpretation    Date/Time:  Friday November 23 2013 13:56:24 EST Ventricular Rate:  63 PR Interval:  130 QRS Duration: 82 QT Interval:  448 QTC Calculation: 458 R Axis:   50 Text Interpretation:  Normal sinus rhythm with sinus arrhythmia Nonspecific T wave abnormality Abnormal ECG No prior for comparison Confirmed by HORTON  MD, Toni Amend ([id number]) on 11/23/2013 2:50:15 PM            MDM   Final diagnoses:  Anxiety  Chest pain   Patient presents with chest pain. She is very anxious appearing and has a history of anxiety attacks. EKG is nonischemic. Troponin is negative. Chest x-ray is within normal limits. Patient was given Ativan with improvement of her symptoms. On recheck, patient is comfortable. She is requesting resources to get help with her anxiety and chronic pain from a back surgery.  She continues to deny SI. She will be given outpatient resources. Patient has a heart score of 1 and a low suspicion for ACS. I have referred the patient back to her primary care physician who will evaluate her for possible  stress testing. Patient was encouraged to return if she has new or worsening symptoms.  After history, exam, and medical workup I feel the patient has been appropriately medically screened and is safe for discharge home. Pertinent diagnoses were discussed with the patient. Patient was given return precautions.     Shon Baton, MD 11/23/13 [phone]

## 2013-11-23 NOTE — Discharge Instructions (Signed)
Chest Pain (Nonspecific) °It is often hard to give a specific diagnosis for the cause of chest pain. There is always a chance that your pain could be related to something serious, such as a heart attack or a blood clot in the lungs. You need to follow up with your caregiver for further evaluation. °CAUSES  °· Heartburn. °· Pneumonia or bronchitis. °· Anxiety or stress. °· Inflammation around your heart (pericarditis) or lung (pleuritis or pleurisy). °· A blood clot in the lung. °· A collapsed lung (pneumothorax). It can develop suddenly on its own (spontaneous pneumothorax) or from injury (trauma) to the chest. °· Shingles infection (herpes zoster virus). °The chest wall is composed of bones, muscles, and cartilage. Any of these can be the source of the pain. °· The bones can be bruised by injury. °· The muscles or cartilage can be strained by coughing or overwork. °· The cartilage can be affected by inflammation and become sore (costochondritis). °DIAGNOSIS  °Lab tests or other studies, such as X-rays, electrocardiography, stress testing, or cardiac imaging, may be needed to find the cause of your pain.  °TREATMENT  °· Treatment depends on what may be causing your chest pain. Treatment may include: °· Acid blockers for heartburn. °· Anti-inflammatory medicine. °· Pain medicine for inflammatory conditions. °· Antibiotics if an infection is present. °· You may be advised to change lifestyle habits. This includes stopping smoking and avoiding alcohol, caffeine, and chocolate. °· You may be advised to keep your head raised (elevated) when sleeping. This reduces the chance of acid going backward from your stomach into your esophagus. °· Most of the time, nonspecific chest pain will improve within 2 to 3 days with rest and mild pain medicine. °HOME CARE INSTRUCTIONS  °· If antibiotics were prescribed, take your antibiotics as directed. Finish them even if you start to feel better. °· For the next few days, avoid physical  activities that bring on chest pain. Continue physical activities as directed. °· Do not smoke. °· Avoid drinking alcohol. °· Only take over-the-counter or prescription medicine for pain, discomfort, or fever as directed by your caregiver. °· Follow your caregiver's suggestions for further testing if your chest pain does not go away. °· Keep any follow-up appointments you made. If you do not go to an appointment, you could develop lasting (chronic) problems with pain. If there is any problem keeping an appointment, you must call to reschedule. °SEEK MEDICAL CARE IF:  °· You think you are having problems from the medicine you are taking. Read your medicine instructions carefully. °· Your chest pain does not go away, even after treatment. °· You develop a rash with blisters on your chest. °SEEK IMMEDIATE MEDICAL CARE IF:  °· You have increased chest pain or pain that spreads to your arm, neck, jaw, back, or abdomen. °· You develop shortness of breath, an increasing cough, or you are coughing up blood. °· You have severe back or abdominal pain, feel nauseous, or vomit. °· You develop severe weakness, fainting, or chills. °· You have a fever. °THIS IS AN EMERGENCY. Do not wait to see if the pain will go away. Get medical help at once. Call your local emergency services (911 in [place] Do not drive yourself to the hospital. °MAKE SURE YOU:  °· Understand these instructions. °· Will watch your condition. °· Will get help right away if you are not doing well or get worse. °Document Released: 07/07/2005 Document Revised: 12/20/2011 Document Reviewed: 05/02/2008 °ExitCare® Patient Information ©2014 ExitCare,   LLC. ° °Panic Attacks °Panic attacks are sudden, short-lived surges of severe anxiety, fear, or discomfort. They may occur for no reason when you are relaxed, when you are anxious, or when you are sleeping. Panic attacks may occur for a number of reasons:  °· Healthy people occasionally have panic attacks in extreme,  life-threatening situations, such as war or natural disasters. Normal anxiety is a protective mechanism of the body that helps us react to danger (fight or flight response). °· Panic attacks are often seen with anxiety disorders, such as panic disorder, social anxiety disorder, generalized anxiety disorder, and phobias. Anxiety disorders cause excessive or uncontrollable anxiety. They may interfere with your relationships or other life activities. °· Panic attacks are sometimes seen with other mental illnesses such as depression and posttraumatic stress disorder. °· Certain medical conditions, prescription medicines, and drugs of abuse can cause panic attacks. °SYMPTOMS  °Panic attacks start suddenly, peak within 20 minutes, and are accompanied by four or more of the following symptoms: °· Pounding heart or fast heart rate (palpitations). °· Sweating. °· Trembling or shaking. °· Shortness of breath or feeling smothered. °· Feeling choked. °· Chest pain or discomfort. °· Nausea or strange feeling in your stomach. °· Dizziness, lightheadedness, or feeling like you will faint. °· Chills or hot flushes. °· Numbness or tingling in your lips or hands and feet. °· Feeling that things are not real or feeling that you are not yourself. °· Fear of losing control or going crazy. °· Fear of dying. °Some of these symptoms can mimic serious medical conditions. For example, you may think you are having a heart attack. Although panic attacks can be very scary, they are not life threatening. °DIAGNOSIS  °Panic attacks are diagnosed through an assessment by your health care provider. Your health care provider will ask questions about your symptoms, such as where and when they occurred. Your health care provider will also ask about your medical history and use of alcohol and drugs, including prescription medicines. Your health care provider may order blood tests or other studies to rule out a serious medical condition. Your health  care provider may refer you to a mental health professional for further evaluation. °TREATMENT  °· Most healthy people who have one or two panic attacks in an extreme, life-threatening situation will not require treatment. °· The treatment for panic attacks associated with anxiety disorders or other mental illness typically involves counseling with a mental health professional, medicine, or a combination of both. Your health care provider will help determine what treatment is best for you. °· Panic attacks due to physical illness usually goes away with treatment of the illness. If prescription medicine is causing panic attacks, talk with your health care provider about stopping the medicine, decreasing the dose, or substituting another medicine. °· Panic attacks due to alcohol or drug abuse goes away with abstinence. Some adults need professional help in order to stop drinking or using drugs. °HOME CARE INSTRUCTIONS  °· Take all your medicines as prescribed.   °· Check with your health care provider before starting new prescription or over-the-counter medicines. °· Keep all follow up appointments with your health care provider. °SEEK MEDICAL CARE IF: °· You are not able to take your medicines as prescribed. °· Your symptoms do not improve or get worse. °SEEK IMMEDIATE MEDICAL CARE IF:  °· You experience panic attack symptoms that are different than your usual symptoms. °· You have serious thoughts about hurting yourself or others. °· You are taking medicine for   attacks and have a serious side effect. MAKE SURE YOU:  Understand these instructions.  Will watch your condition.  Will get help right away if you are not doing well or get worse. Document Released: 09/27/2005 Document Revised: 07/18/2013 Document Reviewed: 05/11/2013 [facility] Patient Information 2014 [employer]Care, [place].  Emergency Department Resource Guide 1) Find a Doctor and Pay Out of Pocket Although you won't have to find out who is  covered by your insurance plan, it is a good idea to ask around and get recommendations. You will then need to call the office and see if the doctor you have chosen will accept you as a new patient and what types of options they offer for patients who are self-pay. Some doctors offer discounts or will set up payment plans for their patients who do not have insurance, but you will need to ask so you aren't surprised when you get to your appointment.  2) Contact Your Local Health Department Not all health departments have doctors that can see patients for sick visits, but many do, so it is worth a call to see if yours does. If you don't know where your local health department is, you can check in your phone book. The CDC also has a tool to help you locate your state's health department, and many state websites also have listings of all of their local health departments.  3) Find a Walk-in Clinic If your illness is not likely to be very severe or complicated, you may want to try a walk in clinic. These are popping up all over the country in pharmacies, drugstores, and shopping centers. They're usually staffed by nurse practitioners or physician assistants that have been trained to treat common illnesses and complaints. They're usually fairly quick and inexpensive. However, if you have serious medical issues or chronic medical problems, these are probably not your best option.  No Primary Care Doctor: - Call Health Connect at  [phone] - they can help you locate a primary care doctor that  accepts your insurance, provides certain services, etc. - Physician Referral Service- [phone]  Chronic Pain Problems: Organization         Address  Phone   Notes  [facility]  [phone] Patients need to be referred by their primary care doctor.   Medication Assistance: Organization         Address  Phone   Notes  [facility] [address] [place], [place] [address] [phone] --Must be a resident of [facility] [place] -- Must have NO insurance coverage whatsoever (no Medicaid/ Medicare, etc.) -- The pt. MUST have a primary care doctor that directs their care regularly and follows them in the community   [facility]  [phone]   [employer]  [phone]    Agencies that provide inexpensive medical care: Organization         Address  Phone   Notes  [facility]  [phone]   [facility]    [phone]   [facility] [address] [place], [place] [address] [phone]   [facility] [address], [place] [phone]   [facility]    [phone]   [facility]    [phone]   [facility]  [address], [place] Phone:  [phone], Fax:  [phone] Hours of Operation:  9 am -  6 pm, M-F.  Also accepts Medicaid/Medicare and self-pay.  [facility]  [address], [place] Phone: [phone], Fax: [phone]. Hours of Operation:  8:30 am - 5:30 pm, M-F.  Also accepts Medicaid and self-pay.  [facility] [place] [address] [place] Phone: [phone]   [facility] [address] Natasha Bence [place], [place] [phone], Ext. [phone] Mondays & Thursdays: 7-9 AM.  First 15 patients are seen on a first come, first serve basis.    Medicaid-accepting [facility] Providers:  Organization         Address  Phone   Notes  [facility] [address], [place] [phone] Also accepts self-pay patients.  [facility] [address] Laurell Josephs [place], [place]  [phone]   [facility] [address], [place] [phone]   [facility] [address] [place] [phone]   Renaye Rakers [address], [place]   [phone] Only accepts [place] Access [place] patients after they have their name applied to their card.   Self-Pay (no insurance) in [facility]:  Organization         Address  Phone   Notes  Sickle Cell Patients, [facility] [address] [place], [place] [phone]   [facility] [address] [place], [place] [phone]   [facility] [place]  [address], [place] [phone]   [facility]/Dr. Osei-Bonsu  [address], [place] or [address], [place] [phone] Phone number for both [place] and [place] locations is the same.  [facility] [address], [place] [phone]   [facility] [address], [place] or [address] [phone]   [facility] [address] [place] [phone], phone; [phone], fax Sees patients 1st and 3rd Saturday of every month.  Must not qualify for public or private insurance (i.e. Medicaid, Medicare, [place] Health Choice, Veterans' Benefits)  Household income should be no more than 200% of the poverty level The clinic cannot treat you if you are pregnant or think you are pregnant  Sexually transmitted diseases are not treated at the clinic.    Dental Care: Organization         Address  Phone  Notes  [facility] Department of [facility] [address] [place], [place] [phone] Accepts children up to age 42 who are enrolled in [place] or [place] Health Choice; pregnant women with a Medicaid card; and children who have applied for Medicaid or [place] Health Choice, but were declined, whose parents can pay a reduced fee at time of service.  [facility] Department of [facility]  [address], [place] [phone] Accepts children up to age 39 who are enrolled  in [place] or [place] Health Choice; pregnant women with a Medicaid card; and children who have applied for Medicaid or [place] Health Choice, but were declined, whose parents can pay a reduced fee at time of service.  [facility]  [address] [place], [place] [phone] Patients are seen by appointment only. Walk-ins are not accepted. [facility] will see patients 74 years of age and  older. Monday - Tuesday (8am-5pm) Most Wednesdays (8:30-5pm) $30 per visit, cash only  [facility] [employer] PRO[facility]  [address], [facility] [phone] Patients are seen by appointment only. Walk-ins are not accepted. [facility] will see patients 36 years of age and older. One Wednesday Evening (Monthly: Volunteer Based).  $30 per visit, cash only  [employer]  [phone] for adults; Children under age 2, call Graduate Pediatric Dentistry at [phone]. Children aged 30-14, please call [phone] to request a pediatric application.  Dental services are provided in all areas of dental care including fillings, crowns and bridges, complete and partial dentures, implants, gum treatment, root canals, and extractions. Preventive care is also provided. Treatment is provided to both adults and children. Patients are selected via a lottery and there is often a waiting list.   [facility] [address], [place]  [phone] [email or web address]   [facility] [address] [place], [place] [phone], Ext. [phone] Second and Fourth Thursday of each month, opens at 6:30 AM; Clinic ends at 9 AM.  Patients are seen on a first-come first-served basis, and a limited number are seen during each clinic.   [facility]  [address] Ether Griffins [place], [place] [phone]   Eligibility Requirements You must have lived in [place], [place], or [place] counties for at least the last three months.   You cannot be  eligible for state or federal sponsored [place], including [employer], [place], or [employer].   You generally cannot be eligible for healthcare insurance through your employer.    How to apply: Eligibility screenings are held every Tuesday and Wednesday afternoon from 1:00 pm until 4:00 pm. You do not need an appointment for the interview!  [facility] [address] [place], [place] [phone]   [facility] Health Department  [phone]   [facility] Health Department  [phone]   [facility] Health Department  [phone]    Behavioral Health Resources in the Community: Intensive Outpatient Programs Organization         Address  Phone  Notes  [facility] [address], [place], [place] [phone]   [facility] [address] [place], [place] [phone]   [facility] [address], [place], [place]  [phone]   [facility] Mental Health [address]  [place], [place] [phone] or [phone]   Substance Abuse Resources Organization         Address  Phone  Notes  [facility]  [phone]   [facility]  [phone]   [facility] [place]  [phone]   Floydene Flock  [phone]   Residential & Outpatient Substance Abuse Program  [phone]   Psychological Services Organization         Address  Phone  Notes  [facility] Behavioral Health  [phone]   [facility]  [phone]   [facility] Mental Health [address], [place] [phone] or [phone]    Mobile Crisis Teams Organization         Address  Phone  Notes  [facility], Mobile Crisis Care Unit  [phone]   [facility]  [address] [place], [place] [phone]   [facility] [address] [place] [phone]    Self-Help/Support Groups Organization          Address  Phone             Notes  [facility] [place] - variety of support groups  [phone]- I7437963 Call for more information  Narcotics Anonymous (NA), [facility] [address], [employer] [place]  2 meetings at this location   Statistician         Address  Phone  Notes  [facility] [address],    [place]  [phone]   [facility]  [address] [place] [address], [place], [place] [phone]   [facility] [address] [place], [place] [phone] Admissions: 8am-3pm M-F  [facility] [address],    [place], [place] [phone]   [facility] [address] [place], [place], [place] [phone]   [facility] [address]  [place], [place] [phone]   [facility] - Intensive Outpatient [address] Laurell Josephs [address], [place], [place] [phone]   [facility] ([facility] [address] [place].,  [place], [place] [phone] or [phone]   [facility] ([facility]) [address], [place], [place] [phone] Accepts Medicaid  [facility] [place] [address],  [place] [phone] Substance Abuse/Addiction Treatment   [facility] Organization         Address  Phone  Notes  [facility]  [phone]   Angie Fava, PhD [address] Ervin Knack [place], [place]   [phone] or [phone]   [facility] Behavioral   [address] [place], [place] [phone]   [facility] [address], [place], [place] [phone] Insurance/Medicaid/sponsorship through [facility] and Families [address]                                    [place], [place] [phone] Therapy/tele-psych/case  [facility] [address], [place] [phone]    Dr. Lolly Mustache  [phone]   [facility]  [employer]  [facility] Dept. 1) [address], [place] 2) [address] [place] 3)  [address], [place] [phone]  [phone]   [facility] Child Abuse Hotline [phone]) [phone] or 2310502841 (After Hours)

## 2014-07-08 ENCOUNTER — Other Ambulatory Visit (HOSPITAL_COMMUNITY): Payer: Self-pay | Admitting: Family Medicine

## 2014-07-08 DIAGNOSIS — M5430 Sciatica, unspecified side: Secondary | ICD-10-CM

## 2014-07-11 ENCOUNTER — Ambulatory Visit (HOSPITAL_COMMUNITY)
Admission: RE | Admit: 2014-07-11 | Discharge: 2014-07-11 | Disposition: A | Discharge status: Home / Self Care | Payer: BC Managed Care – PPO | Source: Ambulatory Visit | Attending: Family Medicine | Admitting: Family Medicine

## 2014-07-11 ENCOUNTER — Encounter (HOSPITAL_COMMUNITY): Payer: Self-pay

## 2014-07-11 DIAGNOSIS — M549 Dorsalgia, unspecified: Secondary | ICD-10-CM | POA: Diagnosis present

## 2014-07-11 DIAGNOSIS — Z9889 Other specified postprocedural states: Secondary | ICD-10-CM | POA: Insufficient documentation

## 2014-07-11 DIAGNOSIS — R200 Anesthesia of skin: Secondary | ICD-10-CM | POA: Diagnosis not present

## 2014-07-11 DIAGNOSIS — M5430 Sciatica, unspecified side: Secondary | ICD-10-CM

## 2014-09-30 ENCOUNTER — Other Ambulatory Visit (HOSPITAL_COMMUNITY): Payer: Self-pay | Admitting: Neurosurgery

## 2014-10-23 ENCOUNTER — Encounter (HOSPITAL_COMMUNITY): Payer: Self-pay

## 2014-10-23 ENCOUNTER — Encounter (HOSPITAL_COMMUNITY)
Admission: RE | Admit: 2014-10-23 | Discharge: 2014-10-23 | Disposition: A | Discharge status: Home / Self Care | Payer: BLUE CROSS/BLUE SHIELD | Source: Ambulatory Visit | Attending: Neurosurgery | Admitting: Neurosurgery

## 2014-10-23 DIAGNOSIS — Z01810 Encounter for preprocedural cardiovascular examination: Secondary | ICD-10-CM | POA: Insufficient documentation

## 2014-10-23 DIAGNOSIS — Z01812 Encounter for preprocedural laboratory examination: Secondary | ICD-10-CM | POA: Insufficient documentation

## 2014-10-23 DIAGNOSIS — M5136 Other intervertebral disc degeneration, lumbar region: Secondary | ICD-10-CM | POA: Diagnosis not present

## 2014-10-23 DIAGNOSIS — Z01818 Encounter for other preprocedural examination: Secondary | ICD-10-CM | POA: Insufficient documentation

## 2014-10-23 HISTORY — DX: Personal history of other diseases of the digestive system: Z87.19

## 2014-10-23 HISTORY — DX: Dorsalgia, unspecified: M54.9

## 2014-10-23 HISTORY — DX: Headache: R51

## 2014-10-23 HISTORY — DX: Headache, unspecified: R51.9

## 2014-10-23 HISTORY — DX: Presence of spectacles and contact lenses: Z97.3

## 2014-10-23 HISTORY — DX: Other chronic pain: G89.29

## 2014-10-23 HISTORY — DX: Fibromyalgia: M79.7

## 2014-10-23 LAB — BASIC METABOLIC PANEL
ANION GAP: 7 (ref 5–15)
BUN: 13 mg/dL (ref 6–23)
CHLORIDE: 103 meq/L (ref 96–112)
CO2: 27 mmol/L (ref 19–32)
CREATININE: 1.04 mg/dL (ref 0.50–1.10)
Calcium: 9.4 mg/dL (ref 8.4–10.5)
GFR calc non Af Amer: 68 mL/min — ABNORMAL LOW (ref >90)
GFR, EST AFRICAN AMERICAN: 79 mL/min — AB (ref >90)
Glucose, Bld: 108 mg/dL — ABNORMAL HIGH (ref 70–99)
POTASSIUM: 4 mmol/L (ref 3.5–5.1)
Sodium: 137 mmol/L (ref 135–145)

## 2014-10-23 LAB — CBC
HCT: 41.4 % (ref 36.0–46.0)
HEMOGLOBIN: 14.2 g/dL (ref 12.0–15.0)
MCH: 32.3 pg (ref 26.0–34.0)
MCHC: 34.3 g/dL (ref 30.0–36.0)
MCV: 94.1 fL (ref 78.0–100.0)
PLATELETS: 288 10*3/uL (ref 150–400)
RBC: 4.4 MIL/uL (ref 3.87–5.11)
RDW: 12.4 % (ref 11.5–15.5)
WBC: 7.6 10*3/uL (ref 4.0–10.5)

## 2014-10-23 LAB — ABO/RH: ABO/RH(D): O POS

## 2014-10-23 LAB — SURGICAL PCR SCREEN
MRSA, PCR: NEGATIVE
Staphylococcus aureus: POSITIVE — AB

## 2014-10-23 LAB — TYPE AND SCREEN
ABO/RH(D): O POS
Antibody Screen: NEGATIVE

## 2014-10-23 LAB — HCG, SERUM, QUALITATIVE: PREG SERUM: NEGATIVE

## 2014-10-23 NOTE — Progress Notes (Signed)
Pt c/o SOB and chest pain this past Monday and Tuesday. Pt denies being under the care of a cardiologist. Pt denies having a stress test, echo and cardiac cath. Spoke with Revonda StandardAllison, PA  ( anesthesia) and Revonda Standardllison advised to repeat EKG and she will assess pt.

## 2014-10-23 NOTE — Progress Notes (Signed)
Anesthesia PAT Evaluation:  Patient is a 39 year old female scheduled for L4-5 decompression PLIF on 10/30/14 by Dr. Newell CoralNudelman.  History includes smoking, fibromyalgia, IBS, Bell's Palsy, anxiety, depression, GERD, wears glasses, headaches, L4-5 lumbar laminectomy/decompression 03/2013.  She was seen in the ED on 11/23/13 for chest pain, felt to likely be related to anxiety attack.  Troponin was 0.  EKG was non-ischemic.  She was referred back to her PCP for additional. recommendations--patient reports he did not refer her for any additional testing. PCP is listed as Dr. John GiovanniStephen Knowlton with reported last visit 09/29/14 and knows about her upcoming surgery.    Meds include Xanax, Adderall, Soma, Percocet, Restoril.  I was asked to see patient due to reports of chest pain and SOB associated with panic attacks. She reports having panic attacks since 2010.  They have progressed in the past few years.  She says all but one have been associated with stress at work. 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  Episodes will start with irregular breathing, muscle tension in her neck, shoulders, and upper back followed by crying and then left chest burning.  She will start to feel almost immediately better after she breathes into a paper bag. She has not noticed any chest pain or SOB associated with activity, although she has not been able to be very active since her 03/2013 back surgery.  Shortly after her back surgery she had to care for her ailing father who had cancer, alcoholism, feeding tube, and impaired mobility.  She was having to help him with transfers, feeding, etc.  He died this past April 2015. She has been under a great deal of stress between the stress of work, being the primary caregiver of her father followed by his death, strained relationship with her sister, and her back and leg  pain.  She has numbness in her feet as well, L > R.  No personal history of DM or CAD. Her sister as DM1, her GF had MVP with possible MVR, and GM had CABG > age 37.  Exam shows a tall, slender Caucasian female in NAD.  Flat affect.  Heart RRR, no murmur. Lungs clear.  No LE edema. No carotid bruits.      10/23/14 EKG: NSR, non-specific T wave abnormality. I think EKG appears stable since 11/23/13.  11/23/13 CXR: No active cardiopulmonary disease.  Preoperative labs noted.  Patient's symptoms seem consistent with her panic attacks which have been occuring intermittently since 2010.  No exertional chest pain.  EKG appears stable.  She is a smoker (1/2 ppd), but otherwise no significant CAD risk factors. If no acute changes or progressive symptoms then I would anticipate that she could proceed as planned.  Anesthesiologist Dr. Ivin Bootyrews agrees with this plan.  Mckenzie Ochsllison Zelenak, PA-C [facility]/Anesthesiology Phone [phone] 10/23/2014 5:13 PM

## 2014-10-23 NOTE — Pre-Procedure Instructions (Signed)
Mckenzie Baker  10/23/2014   Your procedure is scheduled on: Wednesday, October 30, 2014  [facility] Admitting at 6:30 AM.  Call this number if you have problems the morning of surgery: [phone]   Remember:   Do not eat food or drink liquids after midnight Tuesday, October 29, 2014   Take these medicines the morning of surgery with A SIP OF WATER: if needed:ALPRAZolam Prudy Feeler(XANAX) for anxiety, oxyCODONE for pain, sodium chloride (OCEAN)  nasal spray for congestion  Stop taking Aspirin, vitamins, and herbal medications such as  Liniments (SALONPAS) PADS. Do not take any NSAIDs ie: Ibuprofen, Advil, Naproxen or any medication containing Aspirin; stop now.   Do not wear jewelry, make-up or nail polish.  Do not wear lotions, powders, or perfumes. You may not wear deodorant.  Do not shave 48 hours prior to surgery.   Do not bring valuables to the hospital.  [facility] is not responsible for any belongings or valuables.               Contacts, dentures or bridgework may not be worn into surgery.  Leave suitcase in the car. After surgery it may be brought to your room.  For patients admitted to the hospital, discharge time is determined by your treatment team.               Patients discharged the day of surgery will not be allowed to drive home.  Name and phone number of your driver:   Special Instructions:  Special Instructions:Special Instructions: [facility] - Preparing for Surgery  Before surgery, you can play an important role.  Because skin is not sterile, your skin needs to be as free of germs as possible.  You can reduce the number of germs on you skin by washing with CHG (chlorahexidine gluconate) soap before surgery.  CHG is an antiseptic cleaner which kills germs and bonds with the skin to continue killing germs even after washing.  Please DO NOT use if you have an allergy to CHG or antibacterial soaps.  If your skin becomes reddened/irritated stop using the  CHG and inform your nurse when you arrive at Short Stay.  Do not shave (including legs and underarms) for at least 48 hours prior to the first CHG shower.  You may shave your face.  Please follow these instructions carefully:   1.  Shower with CHG Soap the night before surgery and the morning of Surgery.  2.  If you choose to wash your hair, wash your hair first as usual with your normal shampoo.  3.  After you shampoo, rinse your hair and body thoroughly to remove the Shampoo.  4.  Use CHG as you would any other liquid soap.  You can apply chg directly  to the skin and wash gently with scrungie or a clean washcloth.  5.  Apply the CHG Soap to your body ONLY FROM THE NECK DOWN.  Do not use on open wounds or open sores.  Avoid contact with your eyes, ears, mouth and genitals (private parts).  Wash genitals (private parts) with your normal soap.  6.  Wash thoroughly, paying special attention to the area where your surgery will be performed.  7.  Thoroughly rinse your body with warm water from the neck down.  8.  DO NOT shower/wash with your normal soap after using and rinsing off the CHG Soap.  9.  Pat yourself dry with a clean towel.  10.  Wear clean pajamas.            11.  Place clean sheets on your bed the night of your first shower and do not sleep with pets.  Day of Surgery  Do not apply any lotions/deodorants the morning of surgery.  Please wear clean clothes to the hospital/surgery center.   Please read over the following fact sheets that you were given: Pain Booklet, Coughing and Deep Breathing, Blood Transfusion Information, MRSA Information and Surgical Site Infection Prevention

## 2014-10-29 MED ORDER — CEFAZOLIN SODIUM-DEXTROSE 2-3 GM-% IV SOLR
2.0000 g | INTRAVENOUS | Status: AC
  Administered 2014-10-30 (×2): 2 g via INTRAVENOUS
  Filled 2014-10-29: qty 50

## 2014-10-30 ENCOUNTER — Encounter (HOSPITAL_COMMUNITY): Payer: Self-pay | Admitting: *Deleted

## 2014-10-30 ENCOUNTER — Inpatient Hospital Stay (HOSPITAL_COMMUNITY): Payer: BLUE CROSS/BLUE SHIELD | Admitting: Vascular Surgery

## 2014-10-30 ENCOUNTER — Encounter (HOSPITAL_COMMUNITY)
Admission: RE | Disposition: A | Discharge status: Home / Self Care | Payer: BLUE CROSS/BLUE SHIELD | Source: Ambulatory Visit | Attending: Neurosurgery

## 2014-10-30 ENCOUNTER — Inpatient Hospital Stay (HOSPITAL_COMMUNITY)
Admission: RE | Admit: 2014-10-30 | Discharge: 2014-10-31 | DRG: 460 | Disposition: A | Discharge status: Home / Self Care | Payer: BLUE CROSS/BLUE SHIELD | Source: Ambulatory Visit | Attending: Neurosurgery | Admitting: Neurosurgery

## 2014-10-30 ENCOUNTER — Inpatient Hospital Stay (HOSPITAL_COMMUNITY): Payer: BLUE CROSS/BLUE SHIELD | Admitting: Certified Registered Nurse Anesthetist

## 2014-10-30 ENCOUNTER — Inpatient Hospital Stay (HOSPITAL_COMMUNITY): Payer: BLUE CROSS/BLUE SHIELD

## 2014-10-30 DIAGNOSIS — M5116 Intervertebral disc disorders with radiculopathy, lumbar region: Principal | ICD-10-CM | POA: Diagnosis present

## 2014-10-30 DIAGNOSIS — M4726 Other spondylosis with radiculopathy, lumbar region: Secondary | ICD-10-CM | POA: Diagnosis present

## 2014-10-30 DIAGNOSIS — M797 Fibromyalgia: Secondary | ICD-10-CM | POA: Diagnosis present

## 2014-10-30 DIAGNOSIS — M545 Low back pain: Secondary | ICD-10-CM | POA: Diagnosis present

## 2014-10-30 DIAGNOSIS — Z419 Encounter for procedure for purposes other than remedying health state, unspecified: Secondary | ICD-10-CM

## 2014-10-30 DIAGNOSIS — F17200 Nicotine dependence, unspecified, uncomplicated: Secondary | ICD-10-CM | POA: Diagnosis present

## 2014-10-30 DIAGNOSIS — M5126 Other intervertebral disc displacement, lumbar region: Secondary | ICD-10-CM | POA: Diagnosis present

## 2014-10-30 DIAGNOSIS — Z79899 Other long term (current) drug therapy: Secondary | ICD-10-CM | POA: Diagnosis not present

## 2014-10-30 DIAGNOSIS — F419 Anxiety disorder, unspecified: Secondary | ICD-10-CM | POA: Diagnosis present

## 2014-10-30 SURGERY — POSTERIOR LUMBAR FUSION 1 LEVEL
Anesthesia: General | Site: Spine Lumbar

## 2014-10-30 MED ORDER — SODIUM CHLORIDE 0.9 % IV SOLN
10.0000 mg | INTRAVENOUS | Status: DC | PRN
  Administered 2014-10-30: 20 ug/min via INTRAVENOUS

## 2014-10-30 MED ORDER — HYDROMORPHONE HCL 1 MG/ML IJ SOLN
0.2500 mg | INTRAMUSCULAR | Status: DC | PRN
  Administered 2014-10-30 (×4): 0.5 mg via INTRAVENOUS

## 2014-10-30 MED ORDER — HYDROMORPHONE HCL 1 MG/ML IJ SOLN
INTRAMUSCULAR | Status: AC
  Filled 2014-10-30: qty 1

## 2014-10-30 MED ORDER — ROCURONIUM BROMIDE 100 MG/10ML IV SOLN
INTRAVENOUS | Status: DC | PRN
  Administered 2014-10-30: 30 mg via INTRAVENOUS
  Administered 2014-10-30: 50 mg via INTRAVENOUS
  Administered 2014-10-30: 20 mg via INTRAVENOUS
  Administered 2014-10-30: 10 mg via INTRAVENOUS
  Administered 2014-10-30: 20 mg via INTRAVENOUS

## 2014-10-30 MED ORDER — ROCURONIUM BROMIDE 50 MG/5ML IV SOLN
INTRAVENOUS | Status: AC
  Filled 2014-10-30: qty 2

## 2014-10-30 MED ORDER — CYCLOBENZAPRINE HCL 10 MG PO TABS
10.0000 mg | ORAL_TABLET | Freq: Three times a day (TID) | ORAL | Status: DC | PRN
  Administered 2014-10-30 – 2014-10-31 (×4): 10 mg via ORAL
  Filled 2014-10-30 (×3): qty 1

## 2014-10-30 MED ORDER — SODIUM CHLORIDE 0.9 % IJ SOLN
3.0000 mL | Freq: Two times a day (BID) | INTRAMUSCULAR | Status: DC
  Administered 2014-10-30 (×2): 3 mL via INTRAVENOUS

## 2014-10-30 MED ORDER — PROPOFOL 10 MG/ML IV BOLUS
INTRAVENOUS | Status: DC | PRN
  Administered 2014-10-30: 150 mg via INTRAVENOUS

## 2014-10-30 MED ORDER — ALUM & MAG HYDROXIDE-SIMETH 200-200-20 MG/5ML PO SUSP: 30.0000 mL | Freq: Four times a day (QID) | ORAL | Status: DC | PRN

## 2014-10-30 MED ORDER — MORPHINE SULFATE 4 MG/ML IJ SOLN
4.0000 mg | INTRAMUSCULAR | Status: DC | PRN
  Administered 2014-10-30: 4 mg via INTRAMUSCULAR
  Filled 2014-10-30: qty 1

## 2014-10-30 MED ORDER — ONDANSETRON HCL 4 MG/2ML IJ SOLN
INTRAMUSCULAR | Status: AC
Start: 2014-10-30 — End: 2014-10-30
  Filled 2014-10-30: qty 2

## 2014-10-30 MED ORDER — BUPIVACAINE HCL (PF) 0.5 % IJ SOLN
INTRAMUSCULAR | Status: DC | PRN
  Administered 2014-10-30: 10 mL

## 2014-10-30 MED ORDER — DIAZEPAM 5 MG/ML IJ SOLN
INTRAMUSCULAR | Status: AC
  Filled 2014-10-30: qty 2

## 2014-10-30 MED ORDER — MIDAZOLAM HCL 5 MG/5ML IJ SOLN
INTRAMUSCULAR | Status: DC | PRN
  Administered 2014-10-30: 2 mg via INTRAVENOUS

## 2014-10-30 MED ORDER — AMPHETAMINE-DEXTROAMPHETAMINE 20 MG PO TABS: 20.0000 mg | ORAL_TABLET | Freq: Two times a day (BID) | ORAL | Status: DC | PRN

## 2014-10-30 MED ORDER — SUCCINYLCHOLINE CHLORIDE 20 MG/ML IJ SOLN
INTRAMUSCULAR | Status: AC
  Filled 2014-10-30: qty 1

## 2014-10-30 MED ORDER — PHENOL 1.4 % MT LIQD: 1.0000 | OROMUCOSAL | Status: DC | PRN

## 2014-10-30 MED ORDER — KCL IN DEXTROSE-NACL 20-5-0.45 MEQ/L-%-% IV SOLN
INTRAVENOUS | Status: DC
  Administered 2014-10-30: 18:00:00 via INTRAVENOUS
  Filled 2014-10-30 (×5): qty 1000

## 2014-10-30 MED ORDER — ROCURONIUM BROMIDE 50 MG/5ML IV SOLN
INTRAVENOUS | Status: AC
  Filled 2014-10-30: qty 1

## 2014-10-30 MED ORDER — KETOROLAC TROMETHAMINE 30 MG/ML IJ SOLN
INTRAMUSCULAR | Status: AC
  Filled 2014-10-30: qty 1

## 2014-10-30 MED ORDER — LIDOCAINE-EPINEPHRINE 1 %-1:100000 IJ SOLN
INTRAMUSCULAR | Status: DC | PRN
  Administered 2014-10-30: 10 mL

## 2014-10-30 MED ORDER — OXYCODONE-ACETAMINOPHEN 5-325 MG PO TABS
ORAL_TABLET | ORAL | Status: AC
  Filled 2014-10-30: qty 2

## 2014-10-30 MED ORDER — DEXAMETHASONE SODIUM PHOSPHATE 10 MG/ML IJ SOLN
INTRAMUSCULAR | Status: DC | PRN
  Administered 2014-10-30: 10 mg via INTRAVENOUS

## 2014-10-30 MED ORDER — ACETAMINOPHEN 650 MG RE SUPP: 650.0000 mg | RECTAL | Status: DC | PRN

## 2014-10-30 MED ORDER — PROPOFOL 10 MG/ML IV BOLUS
INTRAVENOUS | Status: AC
  Filled 2014-10-30: qty 20

## 2014-10-30 MED ORDER — ALPRAZOLAM 0.5 MG PO TABS
1.0000 mg | ORAL_TABLET | Freq: Three times a day (TID) | ORAL | Status: DC | PRN
  Administered 2014-10-30: 1 mg via ORAL
  Filled 2014-10-30: qty 2

## 2014-10-30 MED ORDER — NEOSTIGMINE METHYLSULFATE 10 MG/10ML IV SOLN
INTRAVENOUS | Status: DC | PRN
  Administered 2014-10-30: 3 mg via INTRAVENOUS

## 2014-10-30 MED ORDER — THROMBIN 20000 UNITS EX SOLR
CUTANEOUS | Status: DC | PRN
  Administered 2014-10-30: 11:00:00 via TOPICAL

## 2014-10-30 MED ORDER — DIAZEPAM 5 MG/ML IJ SOLN
2.5000 mg | Freq: Once | INTRAMUSCULAR | Status: AC
  Administered 2014-10-30: 2.5 mg via INTRAVENOUS

## 2014-10-30 MED ORDER — CYCLOBENZAPRINE HCL 10 MG PO TABS
ORAL_TABLET | ORAL | Status: AC
  Filled 2014-10-30: qty 1

## 2014-10-30 MED ORDER — LACTATED RINGERS IV SOLN
INTRAVENOUS | Status: DC | PRN
  Administered 2014-10-30 (×3): via INTRAVENOUS

## 2014-10-30 MED ORDER — GLYCOPYRROLATE 0.2 MG/ML IJ SOLN
INTRAMUSCULAR | Status: DC | PRN
  Administered 2014-10-30: 0.4 mg via INTRAVENOUS

## 2014-10-30 MED ORDER — SODIUM CHLORIDE 0.9 % IR SOLN
Status: DC | PRN
  Administered 2014-10-30: 500 mL

## 2014-10-30 MED ORDER — 0.9 % SODIUM CHLORIDE (POUR BTL) OPTIME
TOPICAL | Status: DC | PRN
  Administered 2014-10-30: 1000 mL

## 2014-10-30 MED ORDER — LIDOCAINE HCL (CARDIAC) 20 MG/ML IV SOLN
INTRAVENOUS | Status: DC | PRN
  Administered 2014-10-30: 60 mg via INTRAVENOUS

## 2014-10-30 MED ORDER — FENTANYL CITRATE 0.05 MG/ML IJ SOLN
INTRAMUSCULAR | Status: AC
Start: 2014-10-30 — End: 2014-10-30
  Filled 2014-10-30: qty 5

## 2014-10-30 MED ORDER — MAGNESIUM HYDROXIDE 400 MG/5ML PO SUSP: 30.0000 mL | Freq: Every day | ORAL | Status: DC | PRN

## 2014-10-30 MED ORDER — HYDROCODONE-ACETAMINOPHEN 5-325 MG PO TABS: 1.0000 | ORAL_TABLET | ORAL | Status: DC | PRN

## 2014-10-30 MED ORDER — SODIUM CHLORIDE 0.9 % IJ SOLN
INTRAMUSCULAR | Status: AC
Start: 2014-10-30 — End: 2014-10-30
  Filled 2014-10-30: qty 10

## 2014-10-30 MED ORDER — HYDROXYZINE HCL 25 MG PO TABS: 50.0000 mg | ORAL_TABLET | ORAL | Status: DC | PRN

## 2014-10-30 MED ORDER — MUPIROCIN 2 % EX OINT
TOPICAL_OINTMENT | Freq: Two times a day (BID) | CUTANEOUS | Status: DC
  Administered 2014-10-30: 22:00:00 via NASAL

## 2014-10-30 MED ORDER — ALBUMIN HUMAN 5 % IV SOLN
INTRAVENOUS | Status: DC | PRN
  Administered 2014-10-30: 11:00:00 via INTRAVENOUS

## 2014-10-30 MED ORDER — OXYCODONE-ACETAMINOPHEN 5-325 MG PO TABS
1.0000 | ORAL_TABLET | ORAL | Status: DC | PRN
  Administered 2014-10-30: 2 via ORAL

## 2014-10-30 MED ORDER — ACETAMINOPHEN 10 MG/ML IV SOLN
INTRAVENOUS | Status: AC
  Administered 2014-10-30: 1000 mg via INTRAVENOUS
  Filled 2014-10-30: qty 100

## 2014-10-30 MED ORDER — FENTANYL CITRATE 0.05 MG/ML IJ SOLN
INTRAMUSCULAR | Status: DC | PRN
  Administered 2014-10-30 (×5): 50 ug via INTRAVENOUS

## 2014-10-30 MED ORDER — PHENYLEPHRINE 40 MCG/ML (10ML) SYRINGE FOR IV PUSH (FOR BLOOD PRESSURE SUPPORT)
PREFILLED_SYRINGE | INTRAVENOUS | Status: AC
Start: 2014-10-30 — End: 2014-10-30
  Filled 2014-10-30: qty 10

## 2014-10-30 MED ORDER — CARISOPRODOL 350 MG PO TABS
350.0000 mg | ORAL_TABLET | Freq: Every evening | ORAL | Status: DC | PRN
  Administered 2014-10-30: 350 mg via ORAL
  Filled 2014-10-30: qty 1

## 2014-10-30 MED ORDER — PHENYLEPHRINE HCL 10 MG/ML IJ SOLN
INTRAMUSCULAR | Status: DC | PRN
  Administered 2014-10-30: 120 ug via INTRAVENOUS
  Administered 2014-10-30: 80 ug via INTRAVENOUS
  Administered 2014-10-30: 120 ug via INTRAVENOUS
  Administered 2014-10-30: 80 ug via INTRAVENOUS
  Administered 2014-10-30: 120 ug via INTRAVENOUS
  Administered 2014-10-30: 80 ug via INTRAVENOUS

## 2014-10-30 MED ORDER — BISACODYL 10 MG RE SUPP: 10.0000 mg | Freq: Every day | RECTAL | Status: DC | PRN

## 2014-10-30 MED ORDER — EPHEDRINE SULFATE 50 MG/ML IJ SOLN
INTRAMUSCULAR | Status: AC
  Filled 2014-10-30: qty 1

## 2014-10-30 MED ORDER — KETOROLAC TROMETHAMINE 30 MG/ML IJ SOLN
30.0000 mg | Freq: Once | INTRAMUSCULAR | Status: DC
Start: 2014-10-30 — End: 2014-10-30

## 2014-10-30 MED ORDER — ALPRAZOLAM 0.5 MG PO TABS: 1.0000 mg | ORAL_TABLET | Freq: Every evening | ORAL | Status: DC | PRN

## 2014-10-30 MED ORDER — ONDANSETRON HCL 4 MG/2ML IJ SOLN
INTRAMUSCULAR | Status: DC | PRN
  Administered 2014-10-30: 4 mg via INTRAVENOUS

## 2014-10-30 MED ORDER — TEMAZEPAM 15 MG PO CAPS
60.0000 mg | ORAL_CAPSULE | Freq: Every evening | ORAL | Status: DC | PRN
  Filled 2014-10-30: qty 4

## 2014-10-30 MED ORDER — ONDANSETRON HCL 4 MG/2ML IJ SOLN: 4.0000 mg | Freq: Four times a day (QID) | INTRAMUSCULAR | Status: DC | PRN

## 2014-10-30 MED ORDER — PROMETHAZINE HCL 25 MG/ML IJ SOLN: 6.2500 mg | INTRAMUSCULAR | Status: DC | PRN

## 2014-10-30 MED ORDER — SALINE SPRAY 0.65 % NA SOLN: 2.0000 | Freq: Every day | NASAL | Status: DC | PRN

## 2014-10-30 MED ORDER — LIDOCAINE HCL (CARDIAC) 20 MG/ML IV SOLN
INTRAVENOUS | Status: AC
  Filled 2014-10-30: qty 5

## 2014-10-30 MED ORDER — MIDAZOLAM HCL 2 MG/2ML IJ SOLN
INTRAMUSCULAR | Status: AC
  Filled 2014-10-30: qty 2

## 2014-10-30 MED ORDER — OXYCODONE-ACETAMINOPHEN 5-325 MG PO TABS
1.0000 | ORAL_TABLET | ORAL | Status: DC | PRN
  Administered 2014-10-30 – 2014-10-31 (×5): 2 via ORAL
  Filled 2014-10-30 (×5): qty 2

## 2014-10-30 MED ORDER — SODIUM CHLORIDE 0.9 % IJ SOLN: 3.0000 mL | INTRAMUSCULAR | Status: DC | PRN

## 2014-10-30 MED ORDER — MENTHOL 3 MG MT LOZG: 1.0000 | LOZENGE | OROMUCOSAL | Status: DC | PRN

## 2014-10-30 MED ORDER — ACETAMINOPHEN 325 MG PO TABS: 650.0000 mg | ORAL_TABLET | ORAL | Status: DC | PRN

## 2014-10-30 MED ORDER — KETOROLAC TROMETHAMINE 30 MG/ML IJ SOLN
30.0000 mg | Freq: Four times a day (QID) | INTRAMUSCULAR | Status: DC
  Administered 2014-10-30 – 2014-10-31 (×6): 30 mg via INTRAVENOUS
  Filled 2014-10-30 (×4): qty 1

## 2014-10-30 SURGICAL SUPPLY — 70 items
BAG DECANTER FOR FLEXI CONT (MISCELLANEOUS) ×2 IMPLANT
BLADE CLIPPER SURG (BLADE) IMPLANT
BRUSH SCRUB EZ PLAIN DRY (MISCELLANEOUS) ×2 IMPLANT
BUR ACRON 5.0MM COATED (BURR) ×2 IMPLANT
BUR MATCHSTICK NEURO 3.0 LAGG (BURR) ×2 IMPLANT
CANISTER SUCT 3000ML (MISCELLANEOUS) ×2 IMPLANT
CAP LCK SPNE (Orthopedic Implant) ×4 IMPLANT
CAP LOCK SPINE RADIUS (Orthopedic Implant) ×4 IMPLANT
CAP LOCKING (Orthopedic Implant) ×4 IMPLANT
CONT SPEC 4OZ CLIKSEAL STRL BL (MISCELLANEOUS) ×4 IMPLANT
COVER BACK TABLE 60X90IN (DRAPES) ×2 IMPLANT
DERMABOND ADHESIVE PROPEN (GAUZE/BANDAGES/DRESSINGS) ×2
DERMABOND ADVANCED .7 DNX6 (GAUZE/BANDAGES/DRESSINGS) ×2 IMPLANT
DRAPE C-ARM 42X72 X-RAY (DRAPES) ×4 IMPLANT
DRAPE LAPAROTOMY 100X72X124 (DRAPES) ×2 IMPLANT
DRAPE POUCH INSTRU U-SHP 10X18 (DRAPES) ×2 IMPLANT
DRAPE PROXIMA HALF (DRAPES) IMPLANT
DRSG EMULSION OIL 3X3 NADH (GAUZE/BANDAGES/DRESSINGS) IMPLANT
ELECT REM PT RETURN 9FT ADLT (ELECTROSURGICAL) ×2
ELECTRODE REM PT RTRN 9FT ADLT (ELECTROSURGICAL) ×1 IMPLANT
GAUZE SPONGE 4X4 12PLY STRL (GAUZE/BANDAGES/DRESSINGS) ×2 IMPLANT
GAUZE SPONGE 4X4 16PLY XRAY LF (GAUZE/BANDAGES/DRESSINGS) IMPLANT
GLOVE BIO SURGEON STRL SZ7 (GLOVE) ×4 IMPLANT
GLOVE BIO SURGEON STRL SZ8 (GLOVE) ×2 IMPLANT
GLOVE BIOGEL PI IND STRL 8 (GLOVE) ×5 IMPLANT
GLOVE BIOGEL PI INDICATOR 8 (GLOVE) ×5
GLOVE ECLIPSE 7.5 STRL STRAW (GLOVE) ×14 IMPLANT
GLOVE EXAM NITRILE LRG STRL (GLOVE) IMPLANT
GLOVE EXAM NITRILE MD LF STRL (GLOVE) IMPLANT
GLOVE EXAM NITRILE XL STR (GLOVE) IMPLANT
GLOVE EXAM NITRILE XS STR PU (GLOVE) IMPLANT
GOWN STRL REUS W/ TWL LRG LVL3 (GOWN DISPOSABLE) IMPLANT
GOWN STRL REUS W/ TWL XL LVL3 (GOWN DISPOSABLE) ×3 IMPLANT
GOWN STRL REUS W/TWL 2XL LVL3 (GOWN DISPOSABLE) ×4 IMPLANT
GOWN STRL REUS W/TWL LRG LVL3 (GOWN DISPOSABLE)
GOWN STRL REUS W/TWL XL LVL3 (GOWN DISPOSABLE) ×3
GRAFT DURAGEN MATRIX 1WX1L (Tissue) ×2 IMPLANT
KIT BASIN OR (CUSTOM PROCEDURE TRAY) ×2 IMPLANT
KIT INFUSE SMALL (Orthopedic Implant) ×2 IMPLANT
KIT ROOM TURNOVER OR (KITS) ×2 IMPLANT
LIQUID BAND (GAUZE/BANDAGES/DRESSINGS) IMPLANT
MILL MEDIUM DISP (BLADE) IMPLANT
NEEDLE BONE MARROW 8GAX6 (NEEDLE) ×2 IMPLANT
NEEDLE SPNL 18GX3.5 QUINCKE PK (NEEDLE) ×2 IMPLANT
NEEDLE SPNL 22GX3.5 QUINCKE BK (NEEDLE) ×4 IMPLANT
NS IRRIG 1000ML POUR BTL (IV SOLUTION) ×2 IMPLANT
PACK LAMINECTOMY NEURO (CUSTOM PROCEDURE TRAY) ×2 IMPLANT
PAD ARMBOARD 7.5X6 YLW CONV (MISCELLANEOUS) ×10 IMPLANT
PATTIES SURGICAL .5 X.5 (GAUZE/BANDAGES/DRESSINGS) IMPLANT
PATTIES SURGICAL .5 X1 (DISPOSABLE) IMPLANT
PATTIES SURGICAL 1X1 (DISPOSABLE) IMPLANT
PEEK PLIF AVS 8X25X4 DEGREE (Peek) ×4 IMPLANT
ROD RADIUS 35MM (Rod) ×4 IMPLANT
SCREW 5.75X40M (Screw) ×8 IMPLANT
SPONGE LAP 4X18 X RAY DECT (DISPOSABLE) IMPLANT
SPONGE NEURO XRAY DETECT 1X3 (DISPOSABLE) IMPLANT
SPONGE SURGIFOAM ABS GEL 100 (HEMOSTASIS) ×2 IMPLANT
STRIP BIOACTIVE VITOSS 25X100X (Neuro Prosthesis/Implant) ×2 IMPLANT
STRIP BIOACTIVE VITOSS 25X52X4 (Orthopedic Implant) ×2 IMPLANT
SUT VIC AB 1 CT1 18XBRD ANBCTR (SUTURE) ×2 IMPLANT
SUT VIC AB 1 CT1 8-18 (SUTURE) ×2
SUT VIC AB 2-0 CP2 18 (SUTURE) ×4 IMPLANT
SYR 20ML ECCENTRIC (SYRINGE) ×2 IMPLANT
SYR 3ML LL SCALE MARK (SYRINGE) ×8 IMPLANT
SYR CONTROL 10ML LL (SYRINGE) ×2 IMPLANT
TAPE CLOTH SURG 4X10 WHT LF (GAUZE/BANDAGES/DRESSINGS) ×2 IMPLANT
TOWEL OR 17X24 6PK STRL BLUE (TOWEL DISPOSABLE) ×2 IMPLANT
TOWEL OR 17X26 10 PK STRL BLUE (TOWEL DISPOSABLE) ×2 IMPLANT
TRAY FOLEY CATH 14FRSI W/METER (CATHETERS) ×2 IMPLANT
WATER STERILE IRR 1000ML POUR (IV SOLUTION) ×2 IMPLANT

## 2014-10-30 NOTE — Anesthesia Preprocedure Evaluation (Addendum)
Anesthesia Evaluation  Patient identified by MRN, date of birth, ID band Patient awake    History of Anesthesia Complications Negative for: history of anesthetic complications  Airway Mallampati: I  TM Distance: >3 FB Neck ROM: Full    Dental  (+) Teeth Intact, Dental Advisory Given   Pulmonary Current Smoker,  breath sounds clear to auscultation        Cardiovascular Rhythm:Regular Rate:Normal     Neuro/Psych Anxiety Depression    GI/Hepatic GERD-  ,  Endo/Other    Renal/GU      Musculoskeletal  (+) Fibromyalgia -  Abdominal   Peds  Hematology   Anesthesia Other Findings   Reproductive/Obstetrics                            Anesthesia Physical Anesthesia Plan  ASA: I  Anesthesia Plan: General   Post-op Pain Management:    Induction: Intravenous  Airway Management Planned: Oral ETT  Additional Equipment:   Intra-op Plan:   Post-operative Plan: Extubation in OR  Informed Consent: I have reviewed the patients History and Physical, chart, labs and discussed the procedure including the risks, benefits and alternatives for the proposed anesthesia with the patient or authorized representative who has indicated his/her understanding and acceptance.   Dental advisory given  Plan Discussed with: CRNA, Surgeon and Anesthesiologist  Anesthesia Plan Comments:        Anesthesia Quick Evaluation

## 2014-10-30 NOTE — Progress Notes (Signed)
Filed Vitals:   10/30/14 1419 10/30/14 1423 10/30/14 1455 10/30/14 1625  BP: 109/73  118/43 118/60  Pulse: 70 64 59 71  Temp:  97.5 F (36.4 C) 98.2 F (36.8 C) 98.1 F (36.7 C)  TempSrc:      Resp: 17 13 18 18   SpO2: 100% 100% 100% 100%    Patient continued to have a significant amount of incisional pain. Also some occipital headache. Dressing is clean and dry. Moving all extremities well. Foley to straight drainage.  Plan: Discussed medication management with patient and nursing staff. Will adjust Xanax order to have the patient uses at home (1 mg 3 times a day when necessary anxiety). Encouraged to begin to ambulate, but to avoid bending, lifting, and twisting.  Hewitt ShortsNUDELMAN,ROBERT W, MD 10/30/2014, 6:03 PM

## 2014-10-30 NOTE — Op Note (Signed)
10/30/2014  1:36 PM  PATIENT:  Mckenzie Baker  39 y.o. 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, lumbar degenerative disease, lumbar spondylosis, lumbago, lumbar radiculopathy   PROCEDURE:  Procedure(s):  Bilateral L4-5 lumbar decompression including bilateral laminectomy, facetectomy, and foraminotomies for the stenotic compression of the exiting L4 and L5 nerve roots; bilateral L4-5 posterior lumbar interbody arthrodesis with AVS peek interbody implants, Vitoss BA with bone marrow aspirate, and infuse; bilateral L4-5 posterior lateral arthrodesis with nonsegmental radius posterior instrumentation, Vitoss BA with bone marrow aspirate, and infuse  SURGEON:  Surgeon(s): Hewitt Shortsobert W Nudelman, MD Tia Alertavid S Jones, MD  ASSISTANTS: Tia Alertavid S Jones, M.D.  ANESTHESIA:   general  EBL:  Total I/O In: 2250 [I.V.:2000; IV Piggyback:250] Out: 400 [Urine:250; Blood:150]  BLOOD ADMINISTERED:none  CELL SAVER GIVEN: Cell Saver technician felt that there was insufficient blood loss to process to collect the blood.  COUNT: Correct per nursing staff  DICTATION: Patient is brought to the operating room placed under general endotracheal anesthesia. The patient was turned to prone position the lumbar region was prepped with Betadine soap and solution and draped in a sterile fashion. The midline was infiltrated with local anesthesia with epinephrine. A midline incision was made through the previous midline incision, it was extended rostrally, and it was carried down through the subcutaneous tissue, bipolar cautery and electrocautery were used to maintain hemostasis. Dissection was carried down to the lumbar fascia. The fascia was incised bilaterally and the paraspinal muscles were dissected with a spinous process and lamina in a subperiosteal  fashion. Care was taken to work around the previous bilateral L4-5 laminotomies. An x-ray was taken for localization and the L4-5 level was localized. Dissection was then carried out laterally over the facet complex and the transverse processes of L4 and L5 were exposed and decorticated. The scar tissue from around the margins of the previous laminotomies was freed up, and the laminotomies extended rostrally and laterally. Bilateral facetectomies were performed using the high-speed drill, double-action rongeurs, and Kerrison punches. We able to identify the thecal sac, and the exiting L4 and L5 nerve roots bilaterally, and spondylitic overgrowth was L4 and L5 nerve roots. Once the decompression stenotic compression of the thecal sac and exiting nerve roots was completed we proceeded with the posterior lumbar interbody arthrodesis. The annulus was incised bilaterally, and subligamentous disc herniation was removed. The disc space was narrowed bilaterally, but we were able to enter the disc space and proceeded with a discectomy. A thorough discectomy was performed using pituitary rongeurs and curettes. Once the discectomy was completed we began to prepare the endplate surfaces removing the cartilaginous endplates surface. We then measured the height of the intervertebral disc space. We selected 8 x 25 x 4 AVS peek interbody implants.  The C-arm fluoroscope was then draped and brought in the field and we identified the pedicle entry points bilaterally at the L4-L5 levels. Each of the 4 pedicles was probed, we aspirated bone marrow aspirate from the vertebral bodies, this was injected over a 10 cc and a 5 cc strip of Vitoss BA. Then each of the pedicles was examined with the ball probe good bony surfaces were found and no bony cuts were found. Each of the pedicles was then tapped with a 5.25 mm tap, again examined with the ball probe good threading was found and no bony cuts were found. We  then placed 5.75 by 40  millimeter screws bilaterally at each level.  We then packed the AVS peek interbody implants with Vitoss BA with bone marrow aspirate and infuse, and then placed the first implant and on the right side, carefully retracting the thecal sac and nerve root medially. We then went back to the left side and packed the midline with additional Vitoss BA with bone marrow aspirate and infuse, and then placed a second implant and on the left side again retracting the thecal sac and nerve root medially.   We then packed the lateral gutter over the transverse processes and intertransverse space with Vitoss BA with bone marrow aspirate and infuse. We then selected a 35 mm pre-lordosed rods, they were placed within the screw heads and secured with locking caps once all 4 locking caps were placed final tightening was performed against a counter torque.  We did see a small bubble of the intact arachnoid through a small dural defect on the ventral medial aspect of the thecal sac on the left side. No CSF leakage was noted. We placed a pledget of DuraGen over the arachnoid.  The wound had been irrigated multiple times during the procedure with saline solution and bacitracin solution, good hemostasis was established with a combination of bipolar cautery and Gelfoam with thrombin. Once good hemostasis was confirmed we proceeded with closure; the deep fascia was closed with interrupted undyed 1 Vicryl sutures the subcutaneous and subcuticular closed with interrupted inverted 2-0 undyed Vicryl sutures the skin edges were approximated with Dermabond.  Following surgery the patient was turned back to the supine position to be reversed and the anesthetic extubated and transferred to the recovery room for further care.  PLAN OF CARE: Admit to inpatient   PATIENT DISPOSITION:  PACU - hemodynamically stable.   Delay start of Pharmacological VTE agent (>24hrs) due to surgical blood loss or risk of bleeding:  yes

## 2014-10-30 NOTE — H&P (Signed)
Subjective: Patient is a 39 y.o. female who is admitted for treatment of current L4-5 disc herniation, with advanced degenerative disc disease and spondylosis.  She is status post a bilateral L4-5 lumbar laminotomy and microdiscectomy for massive L4-5 lumbar discoloration in June 2014. Following that surgery she had good relief of her bilateral lumbar radicular pain, but the low back discomfort never with fully went away. The back pain began to worsen a year ago, and she is developed increasing recurrent left lumbar radicular pain, numbness, and tingling. Patient is admitted now for an L4-5 lumbar laminectomy, facetectomy, foraminotomy, bilateral L4-5 posterior lumbar interbody arthrodesis, and bilateral L4-5 posterior lateral arthrodesis.    Past Medical History  Diagnosis Date  . Depression   . Anxiety   . GERD (gastroesophageal reflux disease)   . Bell's palsy   . Chronic back pain   . Wears glasses   . History of IBS   . Fibromyalgia   . Headache     Past Surgical History  Procedure Laterality Date  . Lumbar laminectomy/decompression microdiscectomy Bilateral 03/30/2013    Procedure: LUMBAR LAMINECTOMY/DECOMPRESSION MICRODISCECTOMY 1 LEVEL;  Surgeon: Hewitt Shorts, MD;  Location: [facility];  Service: Neurosurgery;  Laterality: Bilateral;  bilateral L45 laminotomy and microdiskectomy  . Dilation and curettage of uterus    . Tooth extraction      Prescriptions prior to admission  Medication Sig Dispense Refill Last Dose  . ALPRAZolam (XANAX) 1 MG tablet Take 1 mg by mouth at bedtime as needed for anxiety.   Past Week at Unknown time  . amphetamine-dextroamphetamine (ADDERALL) 20 MG tablet Take 20 mg by mouth 2 (two) times daily as needed (for improving focus).    Past Week at Unknown time  . carisoprodol (SOMA) 350 MG tablet Take 350 mg by mouth at bedtime as needed for muscle spasms.   10/29/2014 at Unknown time  . ibuprofen (ADVIL,MOTRIN) 800 MG tablet Take 800 mg by mouth every  8 (eight) hours as needed for moderate pain.   Past Month at Unknown time  . oxyCODONE-acetaminophen (PERCOCET/ROXICET) 5-325 MG per tablet Take 1 tablet by mouth every 4 (four) hours as needed for severe pain.   10/30/2014 at 0600  . sodium chloride (OCEAN) 0.65 % nasal spray Place 2 sprays into the nose daily as needed for congestion.   Past Month at Unknown time  . temazepam (RESTORIL) 30 MG capsule Take 60 mg by mouth at bedtime as needed for sleep.    Past Month at Unknown time   No Known Allergies  History  Substance Use Topics  . Smoking status: Current Some Day Smoker -- 0.10 packs/day  . Smokeless tobacco: Never Used  . Alcohol Use: Yes     Comment: occasional    Family History  Problem Relation Age of Onset  . Cancer Father   . Alcoholism Father   . Diabetes Sister      Review of Systems A comprehensive review of systems was negative.  Objective: Vital signs in last 24 hours: Temp:  [99.3 F (37.4 C)] 99.3 F (37.4 C) (01/20 0713) Pulse Rate:  [64] 64 (01/20 0713) Resp:  [20] 20 (01/20 0713) SpO2:  [98 %] 98 % (01/20 0713)  EXAM: Patient is a well-developed well-nourished white female in no acute distress. Lungs are clear to auscultation , the patient has symmetrical respiratory excursion. Heart has a regular rate and rhythm normal S1 and S2 no murmur.   Abdomen is soft nontender nondistended bowel sounds are  present. Extremity examination shows no clubbing cyanosis or edema. Motor examination shows 5 over 5 strength in the lower extremities including the iliopsoas quadriceps dorsiflexor extensor hallicus  longus and plantar flexor bilaterally. Sensation is hyperesthetic to pinprick in the left foot, more so laterally than medially.. Reflexes are symmetrical bilaterally. No pathologic reflexes are present. Patient's gait and stance shows a slouched forward posture.   Data Review:CBC    Component Value Date/Time   WBC 7.6 10/23/2014 1601   RBC 4.40 10/23/2014 1601    HGB 14.2 10/23/2014 1601   HCT 41.4 10/23/2014 1601   PLT 288 10/23/2014 1601   MCV 94.1 10/23/2014 1601   MCH 32.3 10/23/2014 1601   MCHC 34.3 10/23/2014 1601   RDW 12.4 10/23/2014 1601                          BMET    Component Value Date/Time   NA 137 10/23/2014 1601   K 4.0 10/23/2014 1601   CL 103 10/23/2014 1601   CO2 27 10/23/2014 1601   GLUCOSE 108* 10/23/2014 1601   BUN 13 10/23/2014 1601   CREATININE 1.04 10/23/2014 1601   CALCIUM 9.4 10/23/2014 1601   GFRNONAA 68* 10/23/2014 1601   GFRAA 79* 10/23/2014 1601     Assessment/Plan: Patient with a recurrent L4-5 disc herniation, superimposed on advanced degenerative disease and spondylosis, who is admitted now for an L4-5 lumbar decompression and arthrodesis as described above.  I've discussed with the patient the nature of his condition, the nature the surgical procedure, the typical length of surgery, hospital stay, and overall recuperation, the limitations postoperatively, and risks of surgery. I discussed risks including risks of infection, bleeding, possibly need for transfusion, the risk of nerve root dysfunction with pain, weakness, numbness, or paresthesias, the risk of dural tear and CSF leakage and possible need for further surgery, the risk of failure of the arthrodesis and possibly for further surgery, the risk of anesthetic complications including myocardial infarction, stroke, pneumonia, and death. We discussed the need for postoperative immobilization in a lumbar brace. Understanding all this the patient does wish to proceed with surgery and is admitted for such.     Hewitt ShortsNUDELMAN,ROBERT W, MD 10/30/2014 7:31 AM

## 2014-10-30 NOTE — Transfer of Care (Signed)
Immediate Anesthesia Transfer of Care Note  Patient: Mckenzie Baker  Procedure(s) Performed: Procedure(s): LUMBAR FOUR-FIVE DECOMPRESSION,POSTERIOR LUMBAR INTERBODY FUSION WTIH INTERBODY PROSTHESIS,POSTERIOR LATERAL ARTHRODESIS AND POSTERIOR NONSEGMENTAL INSTRUMENTATION (N/A)  Patient Location: PACU  Anesthesia Type:General  Level of Consciousness: awake, alert , oriented and patient cooperative  Airway & Oxygen Therapy: Patient Spontanous Breathing and Patient connected to nasal cannula oxygen  Post-op Assessment: Report given to PACU RN, Post -op Vital signs reviewed and stable and Patient moving all extremities X 4  Post vital signs: Reviewed and stable  Complications: No apparent anesthesia complications

## 2014-10-30 NOTE — Plan of Care (Signed)
Problem: Consults Goal: Diagnosis - Spinal Surgery Outcome: Completed/Met Date Met:  10/30/14 Thoraco/Lumbar Spine Fusion

## 2014-10-31 MED ORDER — CYCLOBENZAPRINE HCL 10 MG PO TABS: 10.0000 mg | ORAL_TABLET | Freq: Three times a day (TID) | ORAL | Status: DC | PRN

## 2014-10-31 MED ORDER — OXYCODONE-ACETAMINOPHEN 5-325 MG PO TABS: 1.0000 | ORAL_TABLET | ORAL | Status: DC | PRN

## 2014-10-31 NOTE — Progress Notes (Signed)
Pt given D/C instructions with Rx's, verbal understanding was provided. Pt's incision is open to air with no sign of infection. Pt's IV was removed prior to D/C. Pt D/C'd home via wheelchair @ 1845 per MD order. Pt is stable @ D/C and has no other needs at this time. Rema FendtAshley Atonya Templer, RN

## 2014-10-31 NOTE — Progress Notes (Signed)
Filed Vitals:   10/30/14 1625 10/30/14 2011 10/31/14 0000 10/31/14 0400  BP: 118/60 107/52 103/45 105/60  Pulse: 71 74 78 65  Temp: 98.1 F (36.7 C) 98.3 F (36.8 C) 98.6 F (37 C) 98.5 F (36.9 C)  TempSrc:  Oral Oral Oral  Resp: 18 20 18 18   SpO2: 100% 99% 99% 99%    Patient more comfortable, has ambulated some. Foley removed by nursing staff this morning, staff to monitor voiding function through today. Encouraged to increase ambulation.  Plan: Continue to progress thru postoperative recovery.  Hewitt ShortsNUDELMAN,ROBERT W, MD 10/31/2014, 7:29 AM

## 2014-10-31 NOTE — Discharge Summary (Signed)
Physician Discharge Summary  Patient ID: Mckenzie Baker MRN: [id number] DOB/AGE: 03-23-1976 39 y.o.  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 Postoperatively she has made good progress. She is up and ambulating actively. Her dressing is to be removed prior to discharge. She's been given an structure guarding wound care and activities following discharge. She is to return for follow-up with me in 3 weeks.  Discharge Exam: Blood pressure 112/66, pulse 81, temperature 98.4 F (36.9 C), temperature source Oral, resp. rate 18, last menstrual period 10/09/2014, SpO2 100 %.  Disposition: Home     Medication List    TAKE these medications        ALPRAZolam 1 MG tablet  Commonly known as:  XANAX  Take 1 mg by mouth at bedtime as needed for anxiety.     amphetamine-dextroamphetamine 20 MG tablet  Commonly known as:  ADDERALL  Take 20 mg by mouth 2 (two) times daily as needed (for improving focus).     carisoprodol 350 MG tablet  Commonly known as:  SOMA  Take 350 mg by mouth at bedtime as needed for muscle spasms.     cyclobenzaprine 10 MG tablet  Commonly known as:  FLEXERIL  Take 1 tablet (10 mg total) by mouth 3 (three) times daily as needed for muscle spasms.     ibuprofen 800 MG tablet  Commonly known as:  ADVIL,MOTRIN  Take 800 mg by mouth every 8 (eight) hours as needed for moderate pain.     oxyCODONE-acetaminophen 5-325 MG per tablet  Commonly known as:  PERCOCET/ROXICET  Take 1 tablet by mouth  every 4 (four) hours as needed for severe pain.     oxyCODONE-acetaminophen 5-325 MG per tablet  Commonly known as:  PERCOCET/ROXICET  Take 1-2 tablets by mouth every 4 (four) hours as needed for moderate pain.     sodium chloride 0.65 % nasal spray  Commonly known as:  OCEAN  Place 2 sprays into the nose daily as needed for congestion.     temazepam 30 MG capsule  Commonly known as:  RESTORIL  Take 60 mg by mouth at bedtime as needed for sleep.         Signed: Hewitt ShortsNUDELMAN,ROBERT W, MD 10/31/2014, 3:34 PM

## 2014-11-01 MED FILL — Sodium Chloride IV Soln 0.9%: INTRAVENOUS | Qty: 1000 | Status: AC

## 2014-11-01 MED FILL — Heparin Sodium (Porcine) Inj 1000 Unit/ML: INTRAMUSCULAR | Qty: 30 | Status: AC

## 2014-11-03 NOTE — Anesthesia Postprocedure Evaluation (Signed)
  Anesthesia Post-op Note  Patient: Mckenzie Baker  Procedure(s) Performed: Procedure(s): LUMBAR FOUR-FIVE DECOMPRESSION,POSTERIOR LUMBAR INTERBODY FUSION WTIH INTERBODY PROSTHESIS,POSTERIOR LATERAL ARTHRODESIS AND POSTERIOR NONSEGMENTAL INSTRUMENTATION (N/A)  Patient Location: PACU  Anesthesia Type:General  Level of Consciousness: awake and alert   Airway and Oxygen Therapy: Patient Spontanous Breathing  Post-op Pain: mild  Post-op Assessment: Post-op Vital signs reviewed  Post-op Vital Signs: stable  Last Vitals:  Filed Vitals:   10/31/14 1640  BP: 115/59  Pulse: 86  Temp: 37.1 C  Resp: 18    Complications: No apparent anesthesia complications

## 2016-01-06 ENCOUNTER — Emergency Department (HOSPITAL_COMMUNITY): Payer: Medicaid Other

## 2016-01-06 ENCOUNTER — Encounter (HOSPITAL_COMMUNITY): Payer: Self-pay | Admitting: Emergency Medicine

## 2016-01-06 ENCOUNTER — Emergency Department (HOSPITAL_COMMUNITY)
Admission: EM | Admit: 2016-01-06 | Discharge: 2016-01-06 | Disposition: A | Discharge status: Home / Self Care | Payer: Medicaid Other | Attending: Emergency Medicine | Admitting: Emergency Medicine

## 2016-01-06 DIAGNOSIS — Z3A01 Less than 8 weeks gestation of pregnancy: Secondary | ICD-10-CM | POA: Diagnosis not present

## 2016-01-06 DIAGNOSIS — F17210 Nicotine dependence, cigarettes, uncomplicated: Secondary | ICD-10-CM | POA: Insufficient documentation

## 2016-01-06 DIAGNOSIS — F329 Major depressive disorder, single episode, unspecified: Secondary | ICD-10-CM | POA: Diagnosis not present

## 2016-01-06 DIAGNOSIS — R252 Cramp and spasm: Secondary | ICD-10-CM | POA: Diagnosis not present

## 2016-01-06 DIAGNOSIS — Z79891 Long term (current) use of opiate analgesic: Secondary | ICD-10-CM | POA: Diagnosis not present

## 2016-01-06 DIAGNOSIS — Z7982 Long term (current) use of aspirin: Secondary | ICD-10-CM | POA: Insufficient documentation

## 2016-01-06 DIAGNOSIS — Z791 Long term (current) use of non-steroidal anti-inflammatories (NSAID): Secondary | ICD-10-CM | POA: Insufficient documentation

## 2016-01-06 DIAGNOSIS — O209 Hemorrhage in early pregnancy, unspecified: Secondary | ICD-10-CM | POA: Insufficient documentation

## 2016-01-06 DIAGNOSIS — O039 Complete or unspecified spontaneous abortion without complication: Secondary | ICD-10-CM

## 2016-01-06 DIAGNOSIS — Z79899 Other long term (current) drug therapy: Secondary | ICD-10-CM | POA: Insufficient documentation

## 2016-01-06 DIAGNOSIS — Z3490 Encounter for supervision of normal pregnancy, unspecified, unspecified trimester: Secondary | ICD-10-CM

## 2016-01-06 DIAGNOSIS — N939 Abnormal uterine and vaginal bleeding, unspecified: Secondary | ICD-10-CM

## 2016-01-06 DIAGNOSIS — R58 Hemorrhage, not elsewhere classified: Secondary | ICD-10-CM

## 2016-01-06 LAB — CBC
HCT: 30.4 % — ABNORMAL LOW (ref 36.0–46.0)
Hemoglobin: 10.7 g/dL — ABNORMAL LOW (ref 12.0–15.0)
MCH: 32 pg (ref 26.0–34.0)
MCHC: 35.2 g/dL (ref 30.0–36.0)
MCV: 91 fL (ref 78.0–100.0)
PLATELETS: 205 10*3/uL (ref 150–400)
RBC: 3.34 MIL/uL — ABNORMAL LOW (ref 3.87–5.11)
RDW: 12.3 % (ref 11.5–15.5)
WBC: 7.6 10*3/uL (ref 4.0–10.5)

## 2016-01-06 LAB — POC URINE PREG, ED: Preg Test, Ur: POSITIVE — AB

## 2016-01-06 LAB — I-STAT BETA HCG BLOOD, ED (MC, WL, AP ONLY)

## 2016-01-06 LAB — HCG, QUANTITATIVE, PREGNANCY: hCG, Beta Chain, Quant, S: 10611 m[IU]/mL — ABNORMAL HIGH (ref <5)

## 2016-01-06 MED ORDER — MISOPROSTOL 100 MCG PO TABS
600.0000 ug | ORAL_TABLET | Freq: Once | ORAL | Status: AC
  Administered 2016-01-06: 600 ug via ORAL
  Filled 2016-01-06: qty 6

## 2016-01-06 MED ORDER — OXYCODONE-ACETAMINOPHEN 5-325 MG PO TABS: 1.0000 | ORAL_TABLET | Freq: Three times a day (TID) | ORAL | Status: DC | PRN

## 2016-01-06 MED ORDER — MORPHINE SULFATE (PF) 4 MG/ML IV SOLN
4.0000 mg | Freq: Once | INTRAVENOUS | Status: AC
  Administered 2016-01-06: 4 mg via INTRAVENOUS
  Filled 2016-01-06: qty 1

## 2016-01-06 MED ORDER — SODIUM CHLORIDE 0.9 % IV BOLUS (SEPSIS)
1000.0000 mL | Freq: Once | INTRAVENOUS | Status: AC
  Administered 2016-01-06: 1000 mL via INTRAVENOUS

## 2016-01-06 NOTE — Consult Note (Signed)
Reason for Consult:pregnancy unknown location vs Incomplete ab Referring Physician: Bebe Shaggy MD [facility]  Mckenzie Baker is an 40 y.o. female. She is evaluated for heavy bleedign and cramping today, with passage of tissue, none of which was brought to ED.  QHCG is 10,000+ and u/s shows no evidence of IUP or ectopic.  Pt has no lateralizing signs, and u/s shows no blood in pelvis, no free fluid, and no sign of ectopic.  Pertinent Gynecological History: Menses:  Bleeding: bled and passed tissue today, none previously  Contraception: none DES exposure: unknown Blood transfusions: none Sexually transmitted diseases: no past history Previous GYN Procedures:   Last mammogram:  Date:  Last pap:  Date:  OB History: G, P   Menstrual History: Menarche age:  Patient's last menstrual period was 12/26/2015.    Past Medical History  Diagnosis Date  . Depression   . Anxiety   . GERD (gastroesophageal reflux disease)   . Bell's palsy   . Chronic back pain   . Wears glasses   . History of IBS   . Fibromyalgia   . Headache     Past Surgical History  Procedure Laterality Date  . Lumbar laminectomy/decompression microdiscectomy Bilateral 03/30/2013    Procedure: LUMBAR LAMINECTOMY/DECOMPRESSION MICRODISCECTOMY 1 LEVEL;  Surgeon: Hewitt Shorts, MD;  Location: [facility];  Service: Neurosurgery;  Laterality: Bilateral;  bilateral L45 laminotomy and microdiskectomy  . Dilation and curettage of uterus    . Tooth extraction      Family History  Problem Relation Age of Onset  . Cancer Father   . Alcoholism Father   . Diabetes Sister     Social History:  reports that she has been smoking Cigarettes.  She has been smoking about 0.25 packs per day. She has never used smokeless tobacco. She reports that she drinks alcohol. She reports that she does not use illicit drugs.  Allergies: No Known Allergies  Medications: I have reviewed the patient's current  medications.  ROS  Blood pressure 100/60, pulse 80, temperature 99.1 F (37.3 C), temperature source Oral, resp. rate 14, height  (1.753 m), weight 175 lb (79.379 kg), last menstrual period 12/26/2015, SpO2 100 %. Physical Exam  Constitutional: She appears well-developed and well-nourished. No distress.  HENT:  Head: Normocephalic and atraumatic.  Cardiovascular: Normal rate.   Respiratory: Effort normal.  GI: Soft.  Musculoskeletal: Normal range of motion.  Neurological: She is alert.  Skin: Skin is warm and dry.  Psychiatric: She has a normal mood and affect. Her behavior is normal. Judgment and thought content normal.    Results for orders placed or performed during the hospital encounter of 01/06/16 (from the past 48 hour(s))  POC urine preg, ED (not at [facility])     Status: Abnormal   Collection Time: 01/06/16 10:29 AM  Result Value Ref Range   Preg Test, Ur POSITIVE (A) NEGATIVE    Comment:        THE SENSITIVITY OF THIS METHODOLOGY IS >24 mIU/mL   I-Stat Beta hCG blood, ED ([facility], [facility], [facility] only)     Status: Abnormal   Collection Time: 01/06/16 11:05 AM  Result Value Ref Range   I-stat hCG, quantitative >2000.0 (H) <5 mIU/mL   Comment 3            Comment:   GEST. AGE      CONC.  (mIU/mL)   <=1 WEEK        5 - 50  2 WEEKS       50 - 500     3 WEEKS       100 - 10,000     4 WEEKS     1,000 - 30,000        FEMALE AND NON-PREGNANT FEMALE:     LESS THAN 5 mIU/mL   CBC     Status: Abnormal   Collection Time: 01/06/16 12:43 PM  Result Value Ref Range   WBC 7.6 4.0 - 10.5 K/uL   RBC 3.34 (L) 3.87 - 5.11 MIL/uL   Hemoglobin 10.7 (L) 12.0 - 15.0 g/dL   HCT 16.130.4 (L) 09.636.0 - 04.546.0 %   MCV 91.0 78.0 - 100.0 fL   MCH 32.0 26.0 - 34.0 pg   MCHC 35.2 30.0 - 36.0 g/dL   RDW 40.912.3 81.111.5 - 91.415.5 %   Platelets 205 150 - 400 K/uL  hCG, quantitative, pregnancy     Status: Abnormal   Collection Time: 01/06/16 12:43 PM  Result Value Ref Range   hCG, Beta Chain, Quant, S 10611 (H) <5  mIU/mL    Comment:          GEST. AGE      CONC.  (mIU/mL)   <=1 WEEK        5 - 50     2 WEEKS       50 - 500     3 WEEKS       100 - 10,000     4 WEEKS     1,000 - 30,000     5 WEEKS     3,500 - 115,000   6-8 WEEKS     12,000 - 270,000    12 WEEKS     15,000 - 220,000        FEMALE AND NON-PREGNANT FEMALE:     LESS THAN 5 mIU/mL     [place] Ob Comp Less 14 Wks  01/06/2016  CLINICAL DATA:  Pelvic pain and bleeding for 1 day, positive pregnancy test EXAM: OBSTETRIC <14 WK US AND TRANSVAGINAL OB US TECHNIQUE: Both transabdominal and transvaginal ultrasound examinations were performed for complete evaluation of the gestation as well as the maternal uterus, adnexal regions, and pelvic cul-de-sac. Transvaginal technique was performed to assess early pregnancy. COMPARISON:  None. FINDINGS: Intrauterine gestational sac: Not visualized Maternal uterus/adnexae: Uterus is within normal limits. No gestational sac is seen. Mild thickening of the endometrium is noted which may be an early decidual reaction. No extra uterine gestational sac is seen. The ovaries are within normal limits. IMPRESSION: No intrauterine or extrauterine gestational sac is identified. Correlation with beta HCG levels is recommended. Electronically Signed   By: Alcide CleverMark  Lukens M.D.   On: 01/06/2016 12:59   [place] Ob Transvaginal  01/06/2016  CLINICAL DATA:  Pelvic pain and bleeding for 1 day, positive pregnancy test EXAM: OBSTETRIC <14 WK US AND TRANSVAGINAL OB US TECHNIQUE: Both transabdominal and transvaginal ultrasound examinations were performed for complete evaluation of the gestation as well as the maternal uterus, adnexal regions, and pelvic cul-de-sac. Transvaginal technique was performed to assess early pregnancy. COMPARISON:  None. FINDINGS: Intrauterine gestational sac: Not visualized Maternal uterus/adnexae: Uterus is within normal limits. No gestational sac is seen. Mild thickening of the endometrium is noted which may be an early  decidual reaction. No extra uterine gestational sac is seen. The ovaries are within normal limits. IMPRESSION: No intrauterine or extrauterine gestational sac is identified. Correlation with beta HCG levels is recommended. Electronically  Signed   By: Alcide Clever M.D.   On: 01/06/2016 12:59  U/s pics reviewed personally.   Assessment/Plan: Hx and assessment most consistent with spontaneous miscarriage The possibility of ectopic not excluded, so short interval followup in office planned for 2 pm tomorrow.  Tilda Burrow 01/06/2016

## 2016-01-06 NOTE — ED Notes (Signed)
Dr. Wickline at bedside updating patient and family. 

## 2016-01-06 NOTE — ED Notes (Signed)
Patient with reports of lower abdominal pain and vaginal bleeding. States she is having a miscarriage, has not had any home pregnancy tests to confirm pregnancy. LMP 11/28/15 that was spotty and light. States her periods are irregular.

## 2016-01-06 NOTE — ED Provider Notes (Signed)
CSN: [id number]     Arrival date & time 01/06/16  0945 History  By signing my name below, I, Mckenzie Baker, attest that this documentation has been prepared under the direction and in the presence of Mckenzie Rhineonald Amalia Edgecombe, MD. Electronically Signed: Ronney LionSuzanne Baker, ED Scribe. 01/06/2016. 12:01 PM.  Chief Complaint  Patient presents with  . Vaginal Bleeding   Patient is a 40 y.o. female presenting with vaginal bleeding. The history is provided by the patient. No language interpreter was used.  Vaginal Bleeding Quality:  Passed tissue and heavier than menses Severity:  Severe Onset quality:  Gradual Duration:  12 hours Timing:  Constant Progression:  Worsening Menstrual history:  Irregular (per nursing notes, patient states her periods are irregular) Possible pregnancy: yes   Context: spontaneously   Relieved by:  Nothing Worsened by:  Nothing tried Ineffective treatments:  None tried Associated symptoms: dizziness   Associated symptoms: no dysuria and no fever   Risk factors: no hx of ectopic pregnancy     HPI Comments: Mckenzie Baker is a 40 y.o. female ([id number]) with a history of depression, anxiety, chronic back pain, IBS, and fibromyalgia, who presents to the Emergency Department complaining of gradual-onset, constant, worsening, vaginal bleeding that began about 12  hours ago, at 11 PM last night. She notes associated suprapubic cramping and some dizziness, although she denies having any syncopal episodes. She states she initially felt as though her menstrual period was coming on before her symptoms quickly worsened. She also states she has been passing "things other than blood" vaginally. Patient states she was unaware she was pregnant until arriving here. Patient notes a history of lumbar laminectomy in 2015 but otherwise denies a history of any chronic medical conditions. She denies a history of ectopic pregnancies. She denies fever, vomiting, chest pain, SOB, dysuria. Patient states she had taken  an aspirin tablet last night but otherwise denies being on any anticoagulation.   Past Medical History  Diagnosis Date  . Depression   . Anxiety   . GERD (gastroesophageal reflux disease)   . Bell's palsy   . Chronic back pain   . Wears glasses   . History of IBS   . Fibromyalgia   . Headache    Past Surgical History  Procedure Laterality Date  . Lumbar laminectomy/decompression microdiscectomy Bilateral 03/30/2013    Procedure: LUMBAR LAMINECTOMY/DECOMPRESSION MICRODISCECTOMY 1 LEVEL;  Surgeon: Hewitt Shortsobert W Nudelman, MD;  Location: [facility];  Service: Neurosurgery;  Laterality: Bilateral;  bilateral L45 laminotomy and microdiskectomy  . Dilation and curettage of uterus    . Tooth extraction     Family History  Problem Relation Age of Onset  . Cancer Father   . Alcoholism Father   . Diabetes Sister    Social History  Substance Use Topics  . Smoking status: Current Some Day Smoker -- 0.25 packs/day    Types: Cigarettes  . Smokeless tobacco: Never Used  . Alcohol Use: Yes     Comment: occasional   OB History    No data available     Review of Systems  Constitutional: Negative for fever.  Respiratory: Negative for shortness of breath.   Cardiovascular: Negative for chest pain.  Gastrointestinal: Negative for vomiting.  Genitourinary: Positive for vaginal bleeding and pelvic pain. Negative for dysuria.  Neurological: Positive for dizziness.  All other systems reviewed and are negative.  Allergies  Review of patient's allergies indicates no known allergies.  Home Medications   Prior to Admission medications  Medication Sig Start Date End Date Taking? Authorizing Provider  ALPRAZolam Prudy Feeler) 1 MG tablet Take 1 mg by mouth at bedtime as needed for anxiety.   Yes Historical Provider, MD  amphetamine-dextroamphetamine (ADDERALL) 20 MG tablet Take 20 mg by mouth 2 (two) times daily as needed (for improving focus).    Yes Historical Provider, MD  aspirin 325 MG tablet  Take 325 mg by mouth daily.   Yes Historical Provider, MD  carisoprodol (SOMA) 350 MG tablet Take 350 mg by mouth at bedtime as needed for muscle spasms.   Yes Historical Provider, MD  ibuprofen (ADVIL,MOTRIN) 800 MG tablet Take 800 mg by mouth every 8 (eight) hours as needed for moderate pain.   Yes Historical Provider, MD  oxyCODONE-acetaminophen (PERCOCET) 10-325 MG tablet Take 1 tablet by mouth every 4 (four) hours as needed for pain.   Yes Historical Provider, MD  temazepam (RESTORIL) 30 MG capsule Take 60 mg by mouth at bedtime as needed for sleep.    Yes Historical Provider, MD  cyclobenzaprine (FLEXERIL) 10 MG tablet Take 1 tablet (10 mg total) by mouth 3 (three) times daily as needed for muscle spasms. Patient not taking: Reported on 01/06/2016 10/31/14   Shirlean Kelly, MD  oxyCODONE-acetaminophen (PERCOCET/ROXICET) 5-325 MG per tablet Take 1 tablet by mouth every 4 (four) hours as needed for severe pain. Reported on 01/06/2016    Historical Provider, MD  oxyCODONE-acetaminophen (PERCOCET/ROXICET) 5-325 MG per tablet Take 1-2 tablets by mouth every 4 (four) hours as needed for moderate pain. Patient not taking: Reported on 01/06/2016 10/31/14   Shirlean Kelly, MD  sodium chloride (OCEAN) 0.65 % nasal spray Place 2 sprays into the nose daily as needed for congestion. Reported on 01/06/2016    Historical Provider, MD   BP 104/68 mmHg  Pulse 113  Temp(Src) 99.1 F (37.3 C) (Oral)  Resp 17  Ht  (1.753 m)  Wt 175 lb (79.379 kg)  BMI 25.83 kg/m2  SpO2 99%  LMP 12/26/2015 Physical Exam  Nursing note and vitals reviewed. CONSTITUTIONAL: Well developed/well nourished HEAD: Normocephalic/atraumatic EYES: EOMI/PERRL ENMT: Mucous membranes moist NECK: supple no meningeal signs SPINE/BACK:entire spine nontender CV: S1/S2 noted, no murmurs/rubs/gallops noted LUNGS: Lungs are clear to auscultation bilaterally, no apparent distress ABDOMEN: soft, nontender, no rebound or guarding, bowel  sounds noted throughout abdomen GU:no cva tenderness; vaginal bleeding noted, no products of conception noted, female chaperone present NEURO: Pt is awake/alert/appropriate, moves all extremitiesx4.  No facial droop.   EXTREMITIES: pulses normal/equal, full ROM SKIN: warm, color normal PSYCH: no abnormalities of mood noted, alert and oriented to situation   ED Course  Procedures   DIAGNOSTIC STUDIES: Oxygen Saturation is 99% on RA, normal by my interpretation.    COORDINATION OF CARE: 11:53 AM - Discussed treatment plan with pt at bedside which includes IV morphine, pelvic exam, and ultrasound. Pt verbalized understanding and agreed to plan.  2:22 PM D/w obgyn concerning US findings with elevated HCG Dr Emelda Fear will review imaging and call back Pt updated on plan  3:05 PM Pt seen by dr Emelda Fear He has reviewed imaging/labs He has examined patient He feels this is likely miscarriage He will order cytotec He will f/u patient in next 24-48 hours with repeat HCG Pt agreeable with plan We discussed return precautions including worsened pain.  Advised she may have some increased bleeding BP 109/70 mmHg  Pulse 82  Temp(Src) 99.1 F (37.3 C) (Oral)  Resp 16  Ht  (1.753 m)  Wt  79.379 kg  BMI 25.83 kg/m2  SpO2 100%  LMP 12/26/2015  Labs Review Labs Reviewed  CBC - Abnormal; Notable for the following:    RBC 3.34 (*)    Hemoglobin 10.7 (*)    HCT 30.4 (*)    All other components within normal limits  HCG, QUANTITATIVE, PREGNANCY - Abnormal; Notable for the following:    hCG, Beta Chain, Quant, S 10611 (*)    All other components within normal limits  POC URINE PREG, ED - Abnormal; Notable for the following:    Preg Test, Ur POSITIVE (*)    All other components within normal limits  I-STAT BETA HCG BLOOD, ED ([facility], [facility], [facility] ONLY) - Abnormal; Notable for the following:    I-stat hCG, quantitative >2000.0 (*)    All other components within normal limits  I-STAT BETA HCG  BLOOD, ED ([facility], [facility], [facility] ONLY)    Imaging Review US Ob Comp Less 14 Wks  01/06/2016  CLINICAL DATA:  Pelvic pain and bleeding for 1 day, positive pregnancy test EXAM: OBSTETRIC <14 WK [place] AND TRANSVAGINAL OB US TECHNIQUE: Both transabdominal and transvaginal ultrasound examinations were performed for complete evaluation of the gestation as well as the maternal uterus, adnexal regions, and pelvic cul-de-sac. Transvaginal technique was performed to assess early pregnancy. COMPARISON:  None. FINDINGS: Intrauterine gestational sac: Not visualized Maternal uterus/adnexae: Uterus is within normal limits. No gestational sac is seen. Mild thickening of the endometrium is noted which may be an early decidual reaction. No extra uterine gestational sac is seen. The ovaries are within normal limits. IMPRESSION: No intrauterine or extrauterine gestational sac is identified. Correlation with beta HCG levels is recommended. Electronically Signed   By: Alcide Clever M.D.   On: 01/06/2016 12:59   US Ob Transvaginal  01/06/2016  CLINICAL DATA:  Pelvic pain and bleeding for 1 day, positive pregnancy test EXAM: OBSTETRIC <14 WK [place] AND TRANSVAGINAL OB US TECHNIQUE: Both transabdominal and transvaginal ultrasound examinations were performed for complete evaluation of the gestation as well as the maternal uterus, adnexal regions, and pelvic cul-de-sac. Transvaginal technique was performed to assess early pregnancy. COMPARISON:  None. FINDINGS: Intrauterine gestational sac: Not visualized Maternal uterus/adnexae: Uterus is within normal limits. No gestational sac is seen. Mild thickening of the endometrium is noted which may be an early decidual reaction. No extra uterine gestational sac is seen. The ovaries are within normal limits. IMPRESSION: No intrauterine or extrauterine gestational sac is identified. Correlation with beta HCG levels is recommended. Electronically Signed   By: Alcide Clever M.D.   On: 01/06/2016 12:59   I have  personally reviewed and evaluated these lab results as part of my medical decision-making.    MDM   Final diagnoses:  Vaginal bleeding  Pregnancy    Nursing notes including past medical history and social history reviewed and considered in documentation Labs/vital reviewed myself and considered during evaluation xrays/imaging reviewed by myself and considered during evaluation   I personally performed the services described in this documentation, which was scribed in my presence. The recorded information has been reviewed and is accurate.       Mckenzie Rhine, MD 01/06/16 [phone]

## 2016-01-07 ENCOUNTER — Encounter: Payer: Self-pay | Admitting: Obstetrics and Gynecology

## 2016-01-07 ENCOUNTER — Ambulatory Visit (INDEPENDENT_AMBULATORY_CARE_PROVIDER_SITE_OTHER): Payer: Medicaid Other | Admitting: Obstetrics and Gynecology

## 2016-01-07 VITALS — BP 100/60 | Ht 69.0 in | Wt 168.5 lb

## 2016-01-07 DIAGNOSIS — Z3009 Encounter for other general counseling and advice on contraception: Secondary | ICD-10-CM | POA: Diagnosis not present

## 2016-01-07 DIAGNOSIS — O034 Incomplete spontaneous abortion without complication: Secondary | ICD-10-CM

## 2016-01-07 MED ORDER — NORETHIN ACE-ETH ESTRAD-FE 1-20 MG-MCG(24) PO TABS: 1.0000 | ORAL_TABLET | Freq: Every day | ORAL | Status: DC

## 2016-01-07 NOTE — Progress Notes (Signed)
[facility] Visit  Patient name: Mckenzie Baker MRN [id number]  Date of birth: Jun 03, 1976  CC & HPI:  Mckenzie Baker is a 40 y.o. female presenting today for follow-up after being seen in the ED yesterday for vaginal bleeding and cramping. She was seen by me and felt to have had a miscarriage. Patient states she passed "a little bit" of tissue through the night. She states she feels better since being seen yesterday.   Patient states she does not desire any future pregnancies. She desires contraception.   ROS:  A complete 10 system review of systems was obtained and all systems are negative except as noted in the HPI and PMH.    Pertinent History Reviewed:   Reviewed: Significant for D&C of uterus Medical         Past Medical History  Diagnosis Date  . Depression   . Anxiety   . GERD (gastroesophageal reflux disease)   . Bell's palsy   . Chronic back pain   . Wears glasses   . History of IBS   . Fibromyalgia   . Headache                               Surgical Hx:    Past Surgical History  Procedure Laterality Date  . Lumbar laminectomy/decompression microdiscectomy Bilateral 03/30/2013    Procedure: LUMBAR LAMINECTOMY/DECOMPRESSION MICRODISCECTOMY 1 LEVEL;  Surgeon: Hewitt Shorts, MD;  Location: [facility];  Service: Neurosurgery;  Laterality: Bilateral;  bilateral L45 laminotomy and microdiskectomy  . Dilation and curettage of uterus    . Tooth extraction     Medications: Reviewed & Updated - see associated section                       Current outpatient prescriptions:  .  ALPRAZolam (XANAX) 1 MG tablet, Take 1 mg by mouth at bedtime as needed for anxiety., Disp: , Rfl:  .  amphetamine-dextroamphetamine (ADDERALL) 20 MG tablet, Take 20 mg by mouth 2 (two) times daily as needed (for improving focus). , Disp: , Rfl:  .  aspirin 325 MG tablet, Take 325 mg by mouth daily., Disp: , Rfl:  .  carisoprodol (SOMA) 350 MG tablet, Take 350 mg by mouth at bedtime as  needed for muscle spasms., Disp: , Rfl:  .  ibuprofen (ADVIL,MOTRIN) 800 MG tablet, Take 800 mg by mouth every 8 (eight) hours as needed for moderate pain., Disp: , Rfl:  .  oxyCODONE-acetaminophen (PERCOCET/ROXICET) 5-325 MG tablet, Take 1 tablet by mouth every 8 (eight) hours as needed for severe pain., Disp: 5 tablet, Rfl: 0 .  sodium chloride (OCEAN) 0.65 % nasal spray, Place 2 sprays into the nose daily as needed for congestion. Reported on 01/06/2016, Disp: , Rfl:  .  temazepam (RESTORIL) 30 MG capsule, Take 60 mg by mouth at bedtime as needed for sleep. , Disp: , Rfl:    Social History: Reviewed -  reports that she has been smoking Cigarettes.  She has been smoking about 0.25 packs per day. She has never used smokeless tobacco.  Objective Findings:  Vitals: Blood pressure 100/60, height  (1.753 m), weight 168 lb 8 oz (76.431 kg), last menstrual period 12/26/2015.  Physical Examination: General appearance - alert, well appearing, and in no distress, oriented to person, place, and time and normal appearing weight Mental status - alert, oriented to person, place,  and time, normal mood, behavior, speech, dress, motor activity, and thought processes, affect appropriate to mood Pelvic -  VULVA: normal appearing vulva with no masses, tenderness or lesions,  VAGINA: normal appearing vagina with normal color and discharge, no lesions,  CERVIX: there is tissue and blood at cervical os. Cervix is open 1 cm. UTERUS: uterus is retroverted, mildly tender; first-degree uterine descensus ADNEXA: normal adnexa in size, nontender bilaterally and no masses   Assessment & Plan:   A:  1. Spontaneous AB.  2. Pt desires contraception.   P:  1. Rx lo loestrin     By signing my name below, I, [name], attest that this documentation has been prepared under the direction and in the presence of Tilda BurrowJohn Joyceann Kruser V, MD. Electronically Signed: [name], ED Scribe. 01/07/2016. 2:45 PM.  I personally  performed the services described in this documentation, which was SCRIBED in my presence. The recorded information has been reviewed and considered accurate. It has been edited as necessary during review. Tilda BurrowFERGUSON,Evalise Abruzzese V, MD

## 2016-01-07 NOTE — Patient Instructions (Signed)
Incomplete Miscarriage A miscarriage is the sudden loss of an unborn baby (fetus) before the 20th week of pregnancy. In an incomplete miscarriage, parts of the fetus or placenta (afterbirth) remain in the body.  Having a miscarriage can be an emotional experience. Talk with your health care provider about any questions you may have about miscarrying, the grieving process, and your future pregnancy plans. CAUSES   Problems with the fetal chromosomes that make it impossible for the baby to develop normally. Problems with the baby's genes or chromosomes are most often the result of errors that occur by chance as the embryo divides and grows. The problems are not inherited from the parents.  Infection of the cervix or uterus.  Hormone problems.  Problems with the cervix, such as having an incompetent cervix. This is when the tissue in the cervix is not strong enough to hold the pregnancy.  Problems with the uterus, such as an abnormally shaped uterus, uterine fibroids, or congenital abnormalities.  Certain medical conditions.  Smoking, drinking alcohol, or taking illegal drugs.  Trauma. SYMPTOMS   Vaginal bleeding or spotting, with or without cramps or pain.  Pain or cramping in the abdomen or lower back.  Passing fluid, tissue, or blood clots from the vagina. DIAGNOSIS  Your health care provider will perform a physical exam. You may also have an ultrasound to confirm the miscarriage. Blood or urine tests may also be ordered. TREATMENT   Usually, a dilation and curettage (D&C) procedure is performed. During a D&C procedure, the cervix is widened (dilated) and any remaining fetal or placental tissue is gently removed from the uterus.  Antibiotic medicines are prescribed if there is an infection. Other medicines may be given to reduce the size of the uterus (contract) if there is a lot of bleeding.  If you have Rh negative blood and your baby was Rh positive, you will need a Rho (D)  immune globulin shot. This shot will protect any future baby from having Rh blood problems in future pregnancies.  You may be confined to bed rest. This means you should stay in bed and only get up to use the bathroom. HOME CARE INSTRUCTIONS   Rest as directed by your health care provider.  Restrict activity as directed by your health care provider. You may be allowed to continue light activity if curettage was not done but you require further treatment.  Keep track of the number of pads you use each day. Keep track of how soaked (saturated) they are. Record this information.  Do not  use tampons.  Do not douche or have sexual intercourse until approved by your health care provider.  Keep all follow-up appointments for reevaluation and continuing management.  Only take over-the-counter or prescription medicines for pain, fever, or discomfort as directed by your health care provider.  Take antibiotic medicine as directed by your health care provider. Make sure you finish it even if you start to feel better. SEEK IMMEDIATE MEDICAL CARE IF:   You experience severe cramps in your stomach, back, or abdomen.  You have an unexplained temperature (make sure to record these temperatures).  You pass large clots or tissue (save these for your health care provider to inspect).  Your bleeding increases.  You become light-headed, weak, or have fainting episodes. MAKE SURE YOU:   Understand these instructions.  Will watch your condition.  Will get help right away if you are not doing well or get worse.   This information is not intended to   replace advice given to you by your health care provider. Make sure you discuss any questions you have with your health care provider.   Document Released: 09/27/2005 Document Revised: 10/18/2014 Document Reviewed: 04/26/2013 Elsevier Interactive Patient Education 2016 [employer]  

## 2016-01-08 LAB — BETA HCG QUANT (REF LAB): HCG QUANT: 4404 m[IU]/mL

## 2016-01-15 ENCOUNTER — Telehealth: Payer: Self-pay | Admitting: *Deleted

## 2016-01-15 NOTE — Telephone Encounter (Signed)
Pt states she was returning a call from our office, but unsure who from our office had called her. Pt informed discussed with Dr.Ferguson's nurse and she had not tried to contact pt but would route the message to Dr. Emelda FearFerguson to make sure she did not need to repeat her [facility]. Pt verbalized understanding.

## 2016-01-20 NOTE — Telephone Encounter (Signed)
Pt is fine if a home preg test is negative.

## 2016-02-04 ENCOUNTER — Encounter: Payer: Self-pay | Admitting: Obstetrics and Gynecology

## 2016-02-04 ENCOUNTER — Ambulatory Visit (INDEPENDENT_AMBULATORY_CARE_PROVIDER_SITE_OTHER): Payer: Medicaid Other | Admitting: Obstetrics and Gynecology

## 2016-02-04 VITALS — BP 100/60 | Ht 69.0 in | Wt 168.0 lb

## 2016-02-04 DIAGNOSIS — O039 Complete or unspecified spontaneous abortion without complication: Secondary | ICD-10-CM

## 2016-02-04 NOTE — Progress Notes (Signed)
Patient ID: Mckenzie Baker, female   DOB: 1975-12-21, 40 y.o.   MRN: [id number] Pt here today for follow up visit after a miscarriage. Pt states that she is still having some light pink spotting but denies any pain. Pt states that she has not had a regular period since the miscarriage.

## 2016-02-04 NOTE — Progress Notes (Signed)
[facility] Visit  02/04/2016            Patient name: Mckenzie Baker MRN [id number]  Date of birth: [birth year]  CC & HPI:  Mckenzie Baker is a 40 y.o. female presenting today for follow up following her miscarriage. Pt has not had a regular period since her miscarriage. Pt states that her miscarriage was a drawn out process that was heavy initially. Pt notes that she has been having intermittent light pink spotting recently that was initially ongoing. Pt denies vaginal pain, abdominal pain, and any other symptoms. Pt is currently on loestrin at this time. Pt notes that she would like another child and that she already has 1 child. Pt is unsure if this is doable due to her multiple back surgeries and being informed that she shouldn't lift anything over 25 lbs.  ROS:  Review of Systems  Gastrointestinal: Negative for abdominal pain.  Genitourinary:       +light pink spotting     Pertinent History Reviewed:   Reviewed: Significant for  Medical         Past Medical History  Diagnosis Date  . Depression   . Anxiety   . GERD (gastroesophageal reflux disease)   . Bell's palsy   . Chronic back pain   . Wears glasses   . History of IBS   . Fibromyalgia   . Headache                               Surgical Hx:    Past Surgical History  Procedure Laterality Date  . Lumbar laminectomy/decompression microdiscectomy Bilateral 03/30/2013    Procedure: LUMBAR LAMINECTOMY/DECOMPRESSION MICRODISCECTOMY 1 LEVEL;  Surgeon: Hewitt Shortsobert W Nudelman, MD;  Location: [facility];  Service: Neurosurgery;  Laterality: Bilateral;  bilateral L45 laminotomy and microdiskectomy  . Dilation and curettage of uterus    . Tooth extraction     Medications: Reviewed & Updated - see associated section                       Current outpatient prescriptions:  .  ALPRAZolam (XANAX) 1 MG tablet, Take 1 mg by mouth at bedtime as needed for anxiety., Disp: , Rfl:  .  amphetamine-dextroamphetamine (ADDERALL) 20  MG tablet, Take 20 mg by mouth 2 (two) times daily as needed (for improving focus). , Disp: , Rfl:  .  aspirin 325 MG tablet, Take 325 mg by mouth daily., Disp: , Rfl:  .  carisoprodol (SOMA) 350 MG tablet, Take 350 mg by mouth at bedtime as needed for muscle spasms., Disp: , Rfl:  .  Norethindrone Acetate-Ethinyl Estrad-FE (LOESTRIN 24 FE) 1-20 MG-MCG(24) tablet, Take 1 tablet by mouth daily., Disp: 1 Package, Rfl: 11 .  oxyCODONE-acetaminophen (PERCOCET/ROXICET) 5-325 MG tablet, Take 1 tablet by mouth every 8 (eight) hours as needed for severe pain., Disp: 5 tablet, Rfl: 0 .  temazepam (RESTORIL) 30 MG capsule, Take 60 mg by mouth at bedtime as needed for sleep. , Disp: , Rfl:    Social History: Reviewed -  reports that she has been smoking Cigarettes.  She has been smoking about 0.25 packs per day. She has never used smokeless tobacco.  Objective Findings:  Vitals: Blood pressure 100/60, height 5\' 9"  (1.753 m), weight 168 lb (76.204 kg), last menstrual period 12/26/2015.  Discussed with pt follow up on miscarriage. At end of discussion, pt  had opportunity to ask questions and has no further questions at this time. Greater than 50% was spent in counseling and coordination of care with the patient. Total face-to-face time greater than: 20 minutes    Assessment & Plan:   A:  1. Completed spontaneous abortion  P:  1. Continue Loestrin Rx.  2. Follow up in 1 year or PRN     By signing my name below, I, Soijett Blue, attest that this documentation has been prepared under the direction and in the presence of Tilda Burrow, MD. Electronically Signed: Soijett Blue, ED Scribe. 02/04/2016. 3:32 PM.  I personally performed the services described in this documentation, which was SCRIBED in my presence. The recorded information has been reviewed and considered accurate. It has been edited as necessary during review. Tilda Burrow, MD   Note: car battery stalled in parking lot, restarted  with jumper cables

## 2020-09-11 ENCOUNTER — Ambulatory Visit: Payer: Self-pay | Attending: Internal Medicine

## 2020-09-11 ENCOUNTER — Ambulatory Visit: Payer: Self-pay

## 2020-09-11 DIAGNOSIS — Z23 Encounter for immunization: Secondary | ICD-10-CM

## 2020-09-11 NOTE — Progress Notes (Signed)
   Covid-19 Vaccination Clinic  Name:  DAIANA VITIELLO    MRN: [id number] DOB: 07/14/1976  09/11/2020  Ms. Stiverson was observed post Covid-19 immunization for 15 minutes without incident. She was provided with Vaccine Information Sheet and instruction to access the V-Safe system.   Ms. Ashmore was instructed to call 911 with any severe reactions post vaccine: Marland Kitchen Difficulty breathing  . Swelling of face and throat  . A fast heartbeat  . A bad rash all over body  . Dizziness and weakness   Immunizations Administered    Name Date Dose VIS Date Route   JANSSEN COVID-19 VACCINE 09/11/2020  1:23 PM 0.5 mL 07/30/2020 Intramuscular   Manufacturer: [employer]   Lot: [id number]   NDC: 16010-932-35

## 2020-09-17 ENCOUNTER — Other Ambulatory Visit: Payer: Medicaid Other

## 2020-09-17 DIAGNOSIS — Z20822 Contact with and (suspected) exposure to covid-19: Secondary | ICD-10-CM

## 2020-09-19 LAB — SARS-COV-2, NAA 2 DAY TAT

## 2020-09-19 LAB — NOVEL CORONAVIRUS, NAA: SARS-CoV-2, NAA: NOT DETECTED

## 2021-06-09 ENCOUNTER — Encounter (HOSPITAL_COMMUNITY): Payer: Self-pay

## 2021-06-09 ENCOUNTER — Other Ambulatory Visit: Payer: Self-pay

## 2021-06-09 ENCOUNTER — Inpatient Hospital Stay (HOSPITAL_COMMUNITY)
Admission: EM | Admit: 2021-06-09 | Discharge: 2021-06-12 | DRG: 101 | Disposition: A | Discharge status: Home / Self Care | Payer: Self-pay | Attending: Internal Medicine | Admitting: Internal Medicine

## 2021-06-09 ENCOUNTER — Emergency Department (HOSPITAL_COMMUNITY): Payer: Self-pay

## 2021-06-09 DIAGNOSIS — R569 Unspecified convulsions: Principal | ICD-10-CM

## 2021-06-09 DIAGNOSIS — E876 Hypokalemia: Secondary | ICD-10-CM | POA: Diagnosis not present

## 2021-06-09 DIAGNOSIS — M546 Pain in thoracic spine: Secondary | ICD-10-CM | POA: Diagnosis present

## 2021-06-09 DIAGNOSIS — Z809 Family history of malignant neoplasm, unspecified: Secondary | ICD-10-CM

## 2021-06-09 DIAGNOSIS — Z811 Family history of alcohol abuse and dependence: Secondary | ICD-10-CM

## 2021-06-09 DIAGNOSIS — K219 Gastro-esophageal reflux disease without esophagitis: Secondary | ICD-10-CM | POA: Diagnosis present

## 2021-06-09 DIAGNOSIS — Z833 Family history of diabetes mellitus: Secondary | ICD-10-CM

## 2021-06-09 DIAGNOSIS — Z6372 Alcoholism and drug addiction in family: Secondary | ICD-10-CM

## 2021-06-09 DIAGNOSIS — F419 Anxiety disorder, unspecified: Secondary | ICD-10-CM | POA: Diagnosis present

## 2021-06-09 DIAGNOSIS — Z20822 Contact with and (suspected) exposure to covid-19: Secondary | ICD-10-CM | POA: Diagnosis present

## 2021-06-09 DIAGNOSIS — Z79899 Other long term (current) drug therapy: Secondary | ICD-10-CM

## 2021-06-09 DIAGNOSIS — M4802 Spinal stenosis, cervical region: Secondary | ICD-10-CM | POA: Diagnosis present

## 2021-06-09 DIAGNOSIS — Z7982 Long term (current) use of aspirin: Secondary | ICD-10-CM

## 2021-06-09 DIAGNOSIS — F17210 Nicotine dependence, cigarettes, uncomplicated: Secondary | ICD-10-CM | POA: Diagnosis present

## 2021-06-09 DIAGNOSIS — R001 Bradycardia, unspecified: Secondary | ICD-10-CM

## 2021-06-09 DIAGNOSIS — F1210 Cannabis abuse, uncomplicated: Secondary | ICD-10-CM

## 2021-06-09 DIAGNOSIS — M797 Fibromyalgia: Secondary | ICD-10-CM | POA: Diagnosis present

## 2021-06-09 DIAGNOSIS — M542 Cervicalgia: Secondary | ICD-10-CM

## 2021-06-09 DIAGNOSIS — G8929 Other chronic pain: Secondary | ICD-10-CM

## 2021-06-09 LAB — I-STAT BETA HCG BLOOD, ED (MC, WL, AP ONLY): I-stat hCG, quantitative: <5 m[IU]/mL (ref <5)

## 2021-06-09 LAB — HEPATIC FUNCTION PANEL
ALT: <5 U/L (ref 0–44)
AST: 37 U/L (ref 15–41)
Albumin: 4.5 g/dL (ref 3.5–5.0)
Alkaline Phosphatase: 78 U/L (ref 38–126)
Bilirubin, Direct: 0.2 mg/dL (ref 0.0–0.2)
Indirect Bilirubin: 0.3 mg/dL (ref 0.3–0.9)
Total Bilirubin: 0.5 mg/dL (ref 0.3–1.2)
Total Protein: 7.8 g/dL (ref 6.5–8.1)

## 2021-06-09 LAB — BASIC METABOLIC PANEL
Anion gap: 10 (ref 5–15)
BUN: 14 mg/dL (ref 6–20)
CO2: 23 mmol/L (ref 22–32)
Calcium: 9.2 mg/dL (ref 8.9–10.3)
Chloride: 103 mmol/L (ref 98–111)
Creatinine, Ser: 1.21 mg/dL — ABNORMAL HIGH (ref 0.44–1.00)
GFR, Estimated: 57 mL/min — ABNORMAL LOW (ref >60)
Glucose, Bld: 170 mg/dL — ABNORMAL HIGH (ref 70–99)
Potassium: 3.7 mmol/L (ref 3.5–5.1)
Sodium: 136 mmol/L (ref 135–145)

## 2021-06-09 LAB — CBC
HCT: 42.7 % (ref 36.0–46.0)
Hemoglobin: 14.4 g/dL (ref 12.0–15.0)
MCH: 31.6 pg (ref 26.0–34.0)
MCHC: 33.7 g/dL (ref 30.0–36.0)
MCV: 93.6 fL (ref 80.0–100.0)
Platelets: 292 10*3/uL (ref 150–400)
RBC: 4.56 MIL/uL (ref 3.87–5.11)
RDW: 12.2 % (ref 11.5–15.5)
WBC: 13.5 10*3/uL — ABNORMAL HIGH (ref 4.0–10.5)
nRBC: 0 % (ref 0.0–0.2)

## 2021-06-09 LAB — LIPASE, BLOOD: Lipase: 19 U/L (ref 11–51)

## 2021-06-09 LAB — RESP PANEL BY RT-PCR (FLU A&B, COVID) ARPGX2
Influenza A by PCR: NEGATIVE
Influenza B by PCR: NEGATIVE
SARS Coronavirus 2 by RT PCR: NEGATIVE

## 2021-06-09 LAB — CBG MONITORING, ED: Glucose-Capillary: 156 mg/dL — ABNORMAL HIGH (ref 70–99)

## 2021-06-09 LAB — MAGNESIUM: Magnesium: 2.8 mg/dL — ABNORMAL HIGH (ref 1.7–2.4)

## 2021-06-09 MED ORDER — METOCLOPRAMIDE HCL 5 MG/ML IJ SOLN
10.0000 mg | Freq: Once | INTRAMUSCULAR | Status: AC
  Administered 2021-06-09: 10 mg via INTRAVENOUS
  Filled 2021-06-09: qty 2

## 2021-06-09 MED ORDER — CHLORHEXIDINE GLUCONATE CLOTH 2 % EX PADS
6.0000 | MEDICATED_PAD | Freq: Every day | CUTANEOUS | Status: DC
  Administered 2021-06-10 – 2021-06-12 (×3): 6 via TOPICAL

## 2021-06-09 MED ORDER — SODIUM CHLORIDE 0.9 % IV BOLUS
1000.0000 mL | Freq: Once | INTRAVENOUS | Status: AC
  Administered 2021-06-09: 1000 mL via INTRAVENOUS

## 2021-06-09 MED ORDER — ONDANSETRON HCL 4 MG/2ML IJ SOLN
4.0000 mg | Freq: Once | INTRAMUSCULAR | Status: AC | PRN
  Administered 2021-06-09: 4 mg via INTRAVENOUS
  Filled 2021-06-09 (×2): qty 2

## 2021-06-09 MED ORDER — LACTATED RINGERS IV BOLUS
1000.0000 mL | Freq: Once | INTRAVENOUS | Status: AC
  Administered 2021-06-09: 1000 mL via INTRAVENOUS

## 2021-06-09 MED ORDER — ATROPINE SULFATE 1 MG/10ML IJ SOSY
0.5000 mg | PREFILLED_SYRINGE | Freq: Once | INTRAMUSCULAR | Status: AC
  Administered 2021-06-09: 0.5 mg via INTRAVENOUS
  Filled 2021-06-09: qty 10

## 2021-06-09 MED ORDER — DROPERIDOL 2.5 MG/ML IJ SOLN
1.2500 mg | Freq: Once | INTRAMUSCULAR | Status: AC
  Administered 2021-06-09: 1.25 mg via INTRAVENOUS
  Filled 2021-06-09: qty 2

## 2021-06-09 MED ORDER — HYDROMORPHONE HCL 1 MG/ML IJ SOLN
1.0000 mg | Freq: Once | INTRAMUSCULAR | Status: AC
  Administered 2021-06-09: 1 mg via INTRAVENOUS
  Filled 2021-06-09: qty 1

## 2021-06-09 NOTE — ED Notes (Signed)
Pt reports no improvement in subjective symptoms after atropine.

## 2021-06-09 NOTE — Assessment & Plan Note (Addendum)
Admit to ICU. Pacer pads. Continue with IVF.  0.5 mg IV atropine for persistent HR <30 bpm.  Check echo. Check TSH, lyme serologies and rickettsial serologies.  Will need cardiology consult in the AM.   Pt not taking any AV nodal blocking agents or other negative chronotropic agents.  Bedside echo did not show any evidence of pericardial effusion.

## 2021-06-09 NOTE — ED Triage Notes (Addendum)
Pt with no hx seizures, had seizure today lasting 30 seconds, postictal upon ems arrival, now reports headache, and vomiting.  Fs 159, diaphoretic, tachycardic HR 113.  20g L AC placed by ems.  Of note, episode occurred while boiling water, does have redness to posterior upper thighs.

## 2021-06-09 NOTE — Assessment & Plan Note (Addendum)
Pt has been taking her xanax daily. Has not missed any doses. May need neurology consult in the AM and EEG.

## 2021-06-09 NOTE — ED Provider Notes (Signed)
[facility] EMERGENCY DEPARTMENT Provider Note   CSN: [id number] Arrival date & time: 06/09/21  1834     History Chief Complaint  Patient presents with   Seizures    Mckenzie Baker is a 45 y.o. female.  HPI 45 year old female presents with seizure.  Had a witnessed seizure at home in her mom's house.  Apparently last about 30 seconds and she was postictal.  Afterwards she was tachycardic and diaphoretic.  She states she is had a headache since waking up that is frontal and right now is about a 6 out of 10 but was worse earlier.  She denies any preceding symptoms that she knows of though the time before and after and during the seizure she cannot remember.  However she does not remember feeling ill earlier today.  No recent fevers or illness.  She is having vomiting since waking up.  She also having some abdominal discomfort and chest discomfort since the vomiting.  No prior history of seizures.  Chronic numbness to her lower extremities that is unchanged.  No new weakness.  Past Medical History:  Diagnosis Date   Anxiety    Bell's palsy    Chronic back pain    Depression    Fibromyalgia    GERD (gastroesophageal reflux disease)    Headache    History of IBS    Wears glasses     Patient Active Problem List   Diagnosis Date Noted   Bradycardia with 31-40 beats per minute 06/09/2021   Witnessed seizure-like activity (HCC) 06/09/2021   Cannabis abuse, daily use 06/09/2021   Chronic neck pain 06/09/2021   HNP (herniated nucleus pulposus), lumbar 10/30/2014    Past Surgical History:  Procedure Laterality Date   DILATION AND CURETTAGE OF UTERUS     LUMBAR LAMINECTOMY/DECOMPRESSION MICRODISCECTOMY Bilateral 03/30/2013   Procedure: LUMBAR LAMINECTOMY/DECOMPRESSION MICRODISCECTOMY 1 LEVEL;  Surgeon: Hewitt Shorts, MD;  Location: [facility];  Service: Neurosurgery;  Laterality: Bilateral;  bilateral L45 laminotomy and microdiskectomy   TOOTH EXTRACTION       OB History    No obstetric history on file.     Family History  Problem Relation Age of Onset   Cancer Father    Alcoholism Father    Diabetes Sister     Social History   Tobacco Use   Smoking status: Every Day    Packs/day: 0.25    Types: Cigarettes   Smokeless tobacco: Never  Vaping Use   Vaping Use: Some days  Substance Use Topics   Alcohol use: Yes    Comment: occasional   Drug use: Yes    Types: Marijuana    Home Medications Prior to Admission medications   Medication Sig Start Date End Date Taking? Authorizing Provider  ALPRAZolam Prudy Feeler) 1 MG tablet Take 1 mg by mouth at bedtime as needed for anxiety.   Yes [provider]  methocarbamol (ROBAXIN) 500 MG tablet Take by mouth. 05/02/20  Yes [provider]  oxyCODONE-acetaminophen (PERCOCET/ROXICET) 5-325 MG tablet Take 1 tablet by mouth every 8 (eight) hours as needed for severe pain. 01/06/16  Yes Zadie Rhine, MD  ALPRAZolam Prudy Feeler) 1 MG tablet Take by mouth.    [provider]  amphetamine-dextroamphetamine (ADDERALL) 20 MG tablet Take 20 mg by mouth 2 (two) times daily as needed (for improving focus).  Patient not taking: Reported on 06/09/2021    [provider]  aspirin 325 MG tablet Take 325 mg by mouth daily. Patient not taking: Reported  on 06/09/2021    [provider]  carisoprodol (SOMA) 350 MG tablet Take 350 mg by mouth at bedtime as needed for muscle spasms. Patient not taking: Reported on 06/09/2021    [provider]  naproxen (NAPROSYN) 500 MG tablet Take by mouth. Patient not taking: No sig reported 05/02/20   [provider]  Norethindrone Acetate-Ethinyl Estrad-FE (LOESTRIN 24 FE) 1-20 MG-MCG(24) tablet Take 1 tablet by mouth daily. Patient not taking: No sig reported 01/07/16   Tilda Burrow, MD  ondansetron (ZOFRAN) 4 MG tablet Take by mouth. Patient not taking: No sig reported 11/25/16   [provider]  temazepam (RESTORIL) 30 MG  capsule Take 60 mg by mouth at bedtime as needed for sleep.  Patient not taking: Reported on 06/09/2021    [provider]    Allergies    Patient has no known allergies.  Review of Systems   Review of Systems  Constitutional:  Negative for fever.  Gastrointestinal:  Positive for abdominal pain, nausea and vomiting.  Neurological:  Positive for seizures, weakness (feels generally weak, no focal weakness) and headaches.  All other systems reviewed and are negative.  Physical Exam Updated Vital Signs BP (!) 141/87   Pulse 84   Temp (!) 97.5 F (36.4 C)   Resp 11   Ht 5\' 9"  (1.753 m)   Wt 74.8 kg   LMP 06/01/2021   SpO2 94%   BMI 24.37 kg/m   Physical Exam Vitals and nursing note reviewed.  Constitutional:      Appearance: She is well-developed. She is not diaphoretic.  HENT:     Head: Normocephalic and atraumatic.     Right Ear: External ear normal.     Left Ear: External ear normal.     Nose: Nose normal.  Eyes:     General:        Right eye: No discharge.        Left eye: No discharge.     Extraocular Movements: Extraocular movements intact.     Pupils: Pupils are equal, round, and reactive to light.  Neck:     Comments: Has some discomfort to back of neck with ROM. This is new since seizure. No rigidity Cardiovascular:     Rate and Rhythm: Normal rate and regular rhythm.     Heart sounds: Normal heart sounds.  Pulmonary:     Effort: Pulmonary effort is normal.     Breath sounds: Normal breath sounds.  Abdominal:     Palpations: Abdomen is soft.     Tenderness: There is abdominal tenderness (upper).  Musculoskeletal:     Cervical back: Normal range of motion and neck supple.  Skin:    General: Skin is warm and dry.     Coloration: Skin is pale.  Neurological:     Mental Status: She is alert.     Comments: CN 3-12 grossly intact. 5/5 strength in all 4 extremities. Grossly normal sensation. Normal finger to nose.   Psychiatric:        Mood and  Affect: Mood is not anxious.    ED Results / Procedures / Treatments   Labs (all labs ordered are listed, but only abnormal results are displayed) Labs Reviewed  BASIC METABOLIC PANEL - Abnormal; Notable for the following components:      Result Value   Glucose, Bld 170 (*)    Creatinine, Ser 1.21 (*)    GFR, Estimated 57 (*)    All other components within  normal limits  CBC - Abnormal; Notable for the following components:   WBC 13.5 (*)    All other components within normal limits  MAGNESIUM - Abnormal; Notable for the following components:   Magnesium 2.8 (*)    All other components within normal limits  CBG MONITORING, ED - Abnormal; Notable for the following components:   Glucose-Capillary 156 (*)    All other components within normal limits  RESP PANEL BY RT-PCR (FLU A&B, COVID) [id number]  HEPATIC FUNCTION PANEL  LIPASE, BLOOD  CBG MONITORING, ED  I-STAT BETA HCG BLOOD, ED ([facility], [facility], [facility] ONLY)  POC URINE PREG, ED    EKG EKG Interpretation  Date/Time:  Tuesday June 09 2021 20:42:10 EDT Ventricular Rate:  38 PR Interval:  152 QRS Duration: 105 QT Interval:  514 QTC Calculation: 409 R Axis:     Text Interpretation: bradycardia. short PR. PVCs Confirmed by Pricilla Loveless [phone]) on 06/09/2021 8:57:14 PM  Radiology CT HEAD WO CONTRAST ( )  Result Date: 06/09/2021 CLINICAL DATA:  Status post seizure. EXAM: CT HEAD WITHOUT CONTRAST TECHNIQUE: Contiguous axial images were obtained from the base of the skull through the vertex without intravenous contrast. COMPARISON:  None. FINDINGS: Brain: No evidence of acute infarction, hemorrhage, hydrocephalus, extra-axial collection or mass lesion/mass effect. Vascular: No hyperdense vessel or unexpected calcification. Skull: Normal. Negative for fracture or focal lesion. Sinuses/Orbits: There is marked severity right maxillary sinus mucosal thickening. Other: None. IMPRESSION: 1. No acute intracranial abnormality. 2. Marked severity  right maxillary sinus mucosal thickening. An underlying polyp versus mucous retention cyst can not be excluded. Electronically Signed   By: Aram Candela M.D.   On: 06/09/2021 20:23   CT Cervical Spine Wo Contrast  Result Date: 06/09/2021 CLINICAL DATA:  Status post seizure. EXAM: CT CERVICAL SPINE WITHOUT CONTRAST TECHNIQUE: Multidetector CT imaging of the cervical spine was performed without intravenous contrast. Multiplanar CT image reconstructions were also generated. COMPARISON:  None. FINDINGS: Alignment: Normal. Skull base and vertebrae: No acute fracture. No primary bone lesion or focal pathologic process. Soft tissues and spinal canal: No prevertebral fluid or swelling. No visible canal hematoma. Disc levels: Marked severity endplate sclerosis and anterior osteophyte formation is seen at the levels of C5-C6 and C6-C7. Mild intervertebral disc space narrowing is seen at the levels of C5-C6 and C6-C7. Normal, bilateral multilevel facet joints are noted. Upper chest: Negative. Other: None. IMPRESSION: 1. Marked severity degenerative changes at the levels of C5-C6 and C6-C7. 2. No evidence of an acute fracture or subluxation. Electronically Signed   By: Aram Candela M.D.   On: 06/09/2021 20:25   DG Chest Portable 1 View  Result Date: 06/09/2021 CLINICAL DATA:  Vomiting and seizures. EXAM: PORTABLE CHEST 1 VIEW COMPARISON:  November 23, 2013 FINDINGS: The heart size and mediastinal contours are within normal limits. Both lungs are clear. The visualized skeletal structures are unremarkable. IMPRESSION: No active cardiopulmonary disease. Electronically Signed   By: Aram Candela M.D.   On: 06/09/2021 20:36    Procedures Procedures   Medications Ordered in ED Medications  ondansetron [facility]) injection 4 mg (4 mg Intravenous Given 06/09/21 2037)  metoCLOPramide (REGLAN) injection 10 mg (10 mg Intravenous Given 06/09/21 1947)  lactated ringers bolus 1,000 mL (0 mLs Intravenous Stopped  06/09/21 2037)  HYDROmorphone (DILAUDID) injection 1 mg (1 mg Intravenous Given 06/09/21 2110)  atropine 1 MG/10ML injection 0.5 mg (0.5 mg Intravenous Given 06/09/21 2217)  sodium chloride 0.9 % bolus 1,000 mL (1,000 mLs Intravenous New Bag/Given  06/09/21 2238)  droperidol (INAPSINE) 2.5 MG/ML injection 1.25 mg (1.25 mg Intravenous Given 06/09/21 2249)    ED Course  I have reviewed the triage vital signs and the nursing notes.  Pertinent labs & imaging results that were available during my care of the patient were reviewed by me and considered in my medical decision making (see chart for details).    MDM Rules/Calculators/A&P                           Patient has not had any further seizure-like activity.  She has been continuing to have nausea and acute on chronic back/neck pain, which are longstanding issues.  Discussed with mom and it sounds like by her description that it was definitely a seizure.  She was then noted to go into a bradycardic rhythm with a heart rate of around 39-40s.  Question if this is atrial and ventricular dyssynchrony.  Discussed with cardiology, Dr. Julianne HandlerNipp, who has viewed the ECGs. For now would be ok to [facility] at [facility] with cards consult in AM. No recent tick bites. Will need seizure workup. No fevers, neck stiffness. Low concern for CNS infection.   While waiting for admission, she was noted to have HR in low 30s and appeared to have some lip cyanosis while hospitalist was there.  He gave some atropine which did resolve the bradycardia. Final Clinical Impression(s) / ED Diagnoses Final diagnoses:  Seizure (HCC)  Bradycardia    Rx / DC Orders ED Discharge Orders     None        Pricilla LovelessGoldston, Maysoon Lozada, MD 06/09/21 [phone]

## 2021-06-09 NOTE — ED Notes (Signed)
Pt says nausea and vomiting have gotten better since last medication

## 2021-06-09 NOTE — Assessment & Plan Note (Signed)
I wonder if her daily cannabis use is associated with her recent bouts of nausea and possible cannabis hyperemesis syndrome.  Her nausea could possibly cause her to hyperstimulate her vagus nerve. Will give 1 dose of droperidol to see if this helps with her nausea. Pt states she does not like hot showers because it makes her light headed, which would argue against hyperemesis syndrome.

## 2021-06-09 NOTE — ED Notes (Signed)
Patient transported to CT 

## 2021-06-09 NOTE — Subjective & Objective (Signed)
CC: syncope, witnessed seizure-like activity. HPI: 45 yo WF with hx of anxiety, chronic neck pain, who lives in [place], [place](currently visiting her mother and her daughter), presents to the ER today after having a witnessed seizure-like activity at home.  Patient is visiting her mother and her daughter.  Patient's daughter lives with the patient's mother (grandmother and granddaughter live together).  Patient states that she woke up this morning feeling somewhat tired.  She spent most the day watching TV.   Patient states that she has felt warm most of the day.  She not have a fever that she knows of.  She has not had any diarrhea, cough.  Around 4-5 o'clock today patient was standing near the stove making dinner for herself.  The patient's niece who is also a local EMT was at the house.  The EMT witnessed the patient standing at the stove then suddenly slumped over.  She was slumped over the stove and then fell backwards.  EMT states the patient was having seizure-like activity.  Patient's pupils were dilated.  She had tremors of her upper extremities and lower extremities.  Her lower extremities were stiff.  This lasted for approximately 30 seconds.  Patient was postictal afterwards.  Patient did not have any urinary incontinence.  Patient does not recall any events after standing near the stove.  She denies having a history of seizure disorder.  Patient does take Xanax every day but has not missed any doses.  Patient states that she did wean herself off of Adderall this month.  Patient states that she does not drink any alcohol.  She does smoke marijuana every day.  In the ER, patient noted to be profoundly bradycardic with heart rates in the high 30s and 40s.  This was captured on EKG.  It appears to be in atrial based rhythm although to be inverted in leads II and III.  ER providers contacted cardiology who did not think the patient need to be transferred to main campus.  Due to the patient's  persistent bradycardia, [facility] hospitalist contacted for admission.  During the interview process with the patient and her mother, the patient had persistent heart rates in the low 30s as.  As low as 32 bpm.  Patient's lips were somewhat cyanotic.  She was diaphoretic.  Patient states that she did not feel well was having some chest pain.  0.5 mg of IV atropine were given with increase in her heart rates to the 80s and 90s beats per minute.  Patient be admitted to the intensive care unit for further monitoring.

## 2021-06-09 NOTE — ED Triage Notes (Signed)
Pt alert to place and time. Disoriented to events and person.

## 2021-06-09 NOTE — H&P (Addendum)
History and Physical    Mckenzie Baker PYK:[id number] DOB: 05-29-1976 DOA: 06/09/2021  PCP: Gareth Morgan, MD   Patient coming from: Home  I have personally briefly reviewed patient's old medical records in [facility]  CC: syncope, witnessed seizure-like activity. HPI: 45 yo WF with hx of anxiety, chronic neck pain, who lives in [place], [place](currently visiting her mother and her daughter), presents to the ER today after having a witnessed seizure-like activity at home.  Patient is visiting her mother and her daughter.  Patient's daughter lives with the patient's mother (grandmother and granddaughter live together).  Patient states that she woke up this morning feeling somewhat tired.  She spent most the day watching TV.   Patient states that she has felt warm most of the day.  She not have a fever that she knows of.  She has not had any diarrhea, cough.  Around 4-5 o'clock today patient was standing near the stove making dinner for herself.  The patient's niece who is also a local EMT was at the house.  The EMT witnessed the patient standing at the stove then suddenly slumped over.  She was slumped over the stove and then fell backwards.  EMT states the patient was having seizure-like activity.  Patient's pupils were dilated.  She had tremors of her upper extremities and lower extremities.  Her lower extremities were stiff.  This lasted for approximately 30 seconds.  Patient was postictal afterwards.  Patient did not have any urinary incontinence.  Patient does not recall any events after standing near the stove.  She denies having a history of seizure disorder.  Patient does take Xanax every day but has not missed any doses.  Patient states that she did wean herself off of Adderall this month.  Patient states that she does not drink any alcohol.  She does smoke marijuana every day.  In the ER, patient noted to be profoundly bradycardic with heart rates in the high 30s and 40s.  This  was captured on EKG.  It appears to be in atrial based rhythm although to be inverted in leads II and III.  ER providers contacted cardiology who did not think the patient need to be transferred to main campus.  Due to the patient's persistent bradycardia, [facility] contacted for admission.  During the interview process with the patient and her mother, the patient had persistent heart rates in the low 30s as.  As low as 32 bpm.  Patient's lips were somewhat cyanotic.  She was diaphoretic.  Patient states that she did not feel well was having some chest pain.  0.5 mg of IV atropine were given with increase in her heart rates to the 80s and 90s beats per minute.  Patient be admitted to the intensive care unit for further monitoring.   ED Course: CT head negative, labs workup negative. Pt noted to be persistently bradycardic with HR in the low 30s, pt was diaphoretic with cyanosis of the lips. 0.5 mg IV atropine given with increase in her HR to 80-90 bpm  Review of Systems:  Review of Systems  Constitutional:        Pt states she has felt warm all day. But no recorded fevers.  HENT: Negative.    Respiratory: Negative.    Cardiovascular:  Positive for chest pain.  Gastrointestinal:  Positive for nausea.  Genitourinary: Negative.   Musculoskeletal:  Positive for neck pain.       Chronic neck pain  Skin: Negative.  Neurological:  Positive for seizures.       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   Psychiatric/Behavioral:  The patient is nervous/anxious.   All other systems reviewed and are negative.  Past Medical History:  Diagnosis Date   Anxiety    Bell's palsy    Chronic back pain    Depression    Fibromyalgia    GERD (gastroesophageal reflux disease)    Headache    History of IBS    Wears glasses     Past Surgical History:  Procedure Laterality Date   DILATION AND CURETTAGE OF UTERUS     LUMBAR LAMINECTOMY/DECOMPRESSION MICRODISCECTOMY  Bilateral 03/30/2013   Procedure: LUMBAR LAMINECTOMY/DECOMPRESSION MICRODISCECTOMY 1 LEVEL;  Surgeon: Hewitt Shorts, MD;  Location: [facility];  Service: Neurosurgery;  Laterality: Bilateral;  bilateral L45 laminotomy and microdiskectomy   TOOTH EXTRACTION       reports that she has been smoking cigarettes. She has been smoking an average of .25 packs per day. She has never used smokeless tobacco. She reports current alcohol use. She reports current drug use. Drug: Marijuana.  No Known Allergies  Family History  Problem Relation Age of Onset   Cancer Father    Alcoholism Father    Diabetes Sister     Prior to Admission medications   Medication Sig Start Date End Date Taking? Authorizing Provider  ALPRAZolam Prudy Feeler) 1 MG tablet Take 1 mg by mouth at bedtime as needed for anxiety.   Yes [provider]  methocarbamol (ROBAXIN) 500 MG tablet Take by mouth. 05/02/20  Yes [provider]  oxyCODONE-acetaminophen (PERCOCET/ROXICET) 5-325 MG tablet Take 1 tablet by mouth every 8 (eight) hours as needed for severe pain. 01/06/16  Yes Zadie Rhine, MD  ALPRAZolam Prudy Feeler) 1 MG tablet Take by mouth.    [provider]  amphetamine-dextroamphetamine (ADDERALL) 20 MG tablet Take 20 mg by mouth 2 (two) times daily as needed (for improving focus).  Patient not taking: Reported on 06/09/2021    [provider]  aspirin 325 MG tablet Take 325 mg by mouth daily. Patient not taking: Reported on 06/09/2021    [provider]  carisoprodol (SOMA) 350 MG tablet Take 350 mg by mouth at bedtime as needed for muscle spasms. Patient not taking: Reported on 06/09/2021    [provider]  naproxen (NAPROSYN) 500 MG tablet Take by mouth. Patient not taking: No sig reported 05/02/20   [provider]  Norethindrone Acetate-Ethinyl Estrad-FE (LOESTRIN 24 FE) 1-20 MG-MCG(24) tablet Take 1 tablet by mouth daily. Patient not taking: No sig reported  01/07/16   Tilda Burrow, MD  ondansetron (ZOFRAN) 4 MG tablet Take by mouth. Patient not taking: No sig reported 11/25/16   [provider]  temazepam (RESTORIL) 30 MG capsule Take 60 mg by mouth at bedtime as needed for sleep.  Patient not taking: Reported on 06/09/2021    [provider]    Physical Exam: Vitals:   06/09/21 2038 06/09/21 2100 06/09/21 2130 06/09/21 2230  BP:  130/74 140/69 (!) 141/80  Pulse: (!) 39 (!) 40 (!) 42 90  Resp: 12 14 14 18   Temp: (!) 97.5 F (36.4 C)     SpO2: 99% 100% 98% 100%  Weight:      Height:        Physical Exam Constitutional:      General: She is not in acute distress.    Appearance: Normal appearance. She is ill-appearing and diaphoretic.  She is not toxic-appearing.  HENT:     Head: Normocephalic and atraumatic.     Nose: Nose normal.  Eyes:     General:        Right eye: No discharge.        Left eye: No discharge.  Cardiovascular:     Rate and Rhythm: Regular rhythm. Tachycardia present.  Pulmonary:     Effort: Pulmonary effort is normal. No respiratory distress.     Breath sounds: No wheezing or rales.  Abdominal:     General: Abdomen is flat. Bowel sounds are normal. There is no distension.     Tenderness: There is no abdominal tenderness. There is no rebound.  Musculoskeletal:     Right lower leg: No edema.     Left lower leg: No edema.  Skin:    Capillary Refill: Capillary refill takes 2 to 3 seconds.  Neurological:     General: No focal deficit present.     Mental Status: She is alert and oriented to person, place, and time.     Labs on Admission: I have personally reviewed following labs and imaging studies  CBC: Recent Labs  Lab 06/09/21 1923  WBC 13.5*  HGB 14.4  HCT 42.7  MCV 93.6  PLT 292   Basic Metabolic Panel: Recent Labs  Lab 06/09/21 1923  NA 136  K 3.7  CL 103  CO2 23  GLUCOSE 170*  BUN 14  CREATININE 1.21*  CALCIUM 9.2  MG 2.8*   GFR: Estimated Creatinine  Clearance: 62.7 mL/min (A) (by C-G formula based on SCr of 1.21 mg/dL (H)). Liver Function Tests: Recent Labs  Lab 06/09/21 1923  AST 37  ALT <5  ALKPHOS 78  BILITOT 0.5  PROT 7.8  ALBUMIN 4.5   Recent Labs  Lab 06/09/21 1923  LIPASE 19   No results for input(s): AMMONIA in the last 168 hours. Coagulation Profile: No results for input(s): INR, PROTIME in the last 168 hours. Cardiac Enzymes: No results for input(s): CKTOTAL, CKMB, CKMBINDEX, TROPONINI in the last 168 hours. BNP (last 3 results) No results for input(s): PROBNP in the last 8760 hours. HbA1C: No results for input(s): HGBA1C in the last 72 hours. CBG: Recent Labs  Lab 06/09/21 1904  GLUCAP 156*   Lipid Profile: No results for input(s): CHOL, HDL, LDLCALC, TRIG, CHOLHDL, LDLDIRECT in the last 72 hours. Thyroid Function Tests: No results for input(s): TSH, T4TOTAL, FREET4, T3FREE, THYROIDAB in the last 72 hours. Anemia Panel: No results for input(s): VITAMINB12, FOLATE, FERRITIN, TIBC, IRON, RETICCTPCT in the last 72 hours. Urine analysis: No results found for: COLORURINE, APPEARANCEUR, LABSPEC, PHURINE, GLUCOSEU, HGBUR, BILIRUBINUR, KETONESUR, PROTEINUR, UROBILINOGEN, NITRITE, LEUKOCYTESUR  Radiological Exams on Admission: I have personally reviewed images CT HEAD WO CONTRAST (5MM)  Result Date: 06/09/2021 CLINICAL DATA:  Status post seizure. EXAM: CT HEAD WITHOUT CONTRAST TECHNIQUE: Contiguous axial images were obtained from the base of the skull through the vertex without intravenous contrast. COMPARISON:  None. FINDINGS: Brain: No evidence of acute infarction, hemorrhage, hydrocephalus, extra-axial collection or mass lesion/mass effect. Vascular: No hyperdense vessel or unexpected calcification. Skull: Normal. Negative for fracture or focal lesion. Sinuses/Orbits: There is marked severity right maxillary sinus mucosal thickening. Other: None. IMPRESSION: 1. No acute intracranial abnormality. 2. Marked  severity right maxillary sinus mucosal thickening. An underlying polyp versus mucous retention cyst can not be excluded. Electronically Signed   By: Aram Candelahaddeus  Houston M.D.   On: 06/09/2021 20:23   CT Cervical Spine Wo  Contrast  Result Date: 06/09/2021 CLINICAL DATA:  Status post seizure. EXAM: CT CERVICAL SPINE WITHOUT CONTRAST TECHNIQUE: Multidetector CT imaging of the cervical spine was performed without intravenous contrast. Multiplanar CT image reconstructions were also generated. COMPARISON:  None. FINDINGS: Alignment: Normal. Skull base and vertebrae: No acute fracture. No primary bone lesion or focal pathologic process. Soft tissues and spinal canal: No prevertebral fluid or swelling. No visible canal hematoma. Disc levels: Marked severity endplate sclerosis and anterior osteophyte formation is seen at the levels of C5-C6 and C6-C7. Mild intervertebral disc space narrowing is seen at the levels of C5-C6 and C6-C7. Normal, bilateral multilevel facet joints are noted. Upper chest: Negative. Other: None. IMPRESSION: 1. Marked severity degenerative changes at the levels of C5-C6 and C6-C7. 2. No evidence of an acute fracture or subluxation. Electronically Signed   By: Aram Candela M.D.   On: 06/09/2021 20:25   DG Chest Portable 1 View  Result Date: 06/09/2021 CLINICAL DATA:  Vomiting and seizures. EXAM: PORTABLE CHEST 1 VIEW COMPARISON:  November 23, 2013 FINDINGS: The heart size and mediastinal contours are within normal limits. Both lungs are clear. The visualized skeletal structures are unremarkable. IMPRESSION: No active cardiopulmonary disease. Electronically Signed   By: Aram Candela M.D.   On: 06/09/2021 20:36    EKG: I have personally reviewed EKG: atrial rhythm but not sinus due to inverted p-waves in II, III   Assessment/Plan Principal Problem:   Bradycardia with 31-40 beats per minute Active Problems:   Witnessed seizure-like activity (HCC)   Cannabis abuse, daily use    Chronic neck pain    Bradycardia with 31-40 beats per minute Admit to ICU. Pacer pads. Continue with IVF.  0.5 mg IV atropine for persistent HR <30 bpm.  Check echo. Check TSH, lyme serologies and rickettsial serologies.  Will need cardiology consult in the AM.   Pt not taking any AV nodal blocking agents or other negative chronotropic agents.  Bedside echo did not show any evidence of pericardial effusion.  Witnessed seizure-like activity (HCC) Pt has been taking her xanax daily. Has not missed any doses. May need neurology consult in the AM and EEG.  Cannabis abuse, daily use I wonder if her daily cannabis use is associated with her recent bouts of nausea and possible cannabis hyperemesis syndrome.  Her nausea could possibly cause her to hyperstimulate her vagus nerve. Will give 1 dose of droperidol to see if this helps with her nausea. Pt states she does not like hot showers because it makes her light headed, which would argue against hyperemesis syndrome.  DVT prophylaxis: SCDs Code Status: Full Code Family Communication: discussed with pt, pt's mother joyce and pt's niece sierra at bedside  Disposition Plan: return to home  Consults called: will need cardiology and neurology consults called in the AM. EDP did discuss case with cardiology at main campus.  Admission status: Inpatient,  ICU   Carollee Herter, DO [facility] 06/09/2021, 10:50 PM

## 2021-06-10 ENCOUNTER — Inpatient Hospital Stay (HOSPITAL_COMMUNITY): Payer: Self-pay

## 2021-06-10 DIAGNOSIS — R55 Syncope and collapse: Secondary | ICD-10-CM

## 2021-06-10 DIAGNOSIS — R079 Chest pain, unspecified: Secondary | ICD-10-CM

## 2021-06-10 DIAGNOSIS — R001 Bradycardia, unspecified: Secondary | ICD-10-CM

## 2021-06-10 DIAGNOSIS — R29898 Other symptoms and signs involving the musculoskeletal system: Secondary | ICD-10-CM

## 2021-06-10 DIAGNOSIS — R202 Paresthesia of skin: Secondary | ICD-10-CM

## 2021-06-10 LAB — COMPREHENSIVE METABOLIC PANEL
ALT: 16 U/L (ref 0–44)
AST: 20 U/L (ref 15–41)
Albumin: 3.6 g/dL (ref 3.5–5.0)
Alkaline Phosphatase: 59 U/L (ref 38–126)
Anion gap: 6 (ref 5–15)
BUN: 12 mg/dL (ref 6–20)
CO2: 24 mmol/L (ref 22–32)
Calcium: 8.2 mg/dL — ABNORMAL LOW (ref 8.9–10.3)
Chloride: 106 mmol/L (ref 98–111)
Creatinine, Ser: 0.82 mg/dL (ref 0.44–1.00)
GFR, Estimated: >60 mL/min (ref >60)
Glucose, Bld: 106 mg/dL — ABNORMAL HIGH (ref 70–99)
Potassium: 3.9 mmol/L (ref 3.5–5.1)
Sodium: 136 mmol/L (ref 135–145)
Total Bilirubin: 0.4 mg/dL (ref 0.3–1.2)
Total Protein: 6.3 g/dL — ABNORMAL LOW (ref 6.5–8.1)

## 2021-06-10 LAB — T4, FREE: Free T4: 0.84 ng/dL (ref 0.61–1.12)

## 2021-06-10 LAB — CBC WITH DIFFERENTIAL/PLATELET
Abs Immature Granulocytes: 0.09 10*3/uL — ABNORMAL HIGH (ref 0.00–0.07)
Basophils Absolute: 0 10*3/uL (ref 0.0–0.1)
Basophils Relative: 0 %
Eosinophils Absolute: 0 10*3/uL (ref 0.0–0.5)
Eosinophils Relative: 0 %
HCT: 35.2 % — ABNORMAL LOW (ref 36.0–46.0)
Hemoglobin: 12 g/dL (ref 12.0–15.0)
Immature Granulocytes: 1 %
Lymphocytes Relative: 9 %
Lymphs Abs: 1.4 10*3/uL (ref 0.7–4.0)
MCH: 31.9 pg (ref 26.0–34.0)
MCHC: 34.1 g/dL (ref 30.0–36.0)
MCV: 93.6 fL (ref 80.0–100.0)
Monocytes Absolute: 0.6 10*3/uL (ref 0.1–1.0)
Monocytes Relative: 3 %
Neutro Abs: 14.8 10*3/uL — ABNORMAL HIGH (ref 1.7–7.7)
Neutrophils Relative %: 87 %
Platelets: 213 10*3/uL (ref 150–400)
RBC: 3.76 MIL/uL — ABNORMAL LOW (ref 3.87–5.11)
RDW: 12.2 % (ref 11.5–15.5)
WBC: 16.9 10*3/uL — ABNORMAL HIGH (ref 4.0–10.5)
nRBC: 0 % (ref 0.0–0.2)

## 2021-06-10 LAB — PROCALCITONIN: Procalcitonin: <0.1 ng/mL

## 2021-06-10 LAB — ECHOCARDIOGRAM COMPLETE
AR max vel: 2.52 cm2
AV Area VTI: 2.31 cm2
AV Area mean vel: 2.26 cm2
AV Mean grad: 5 mmHg
AV Peak grad: 8.2 mmHg
Ao pk vel: 1.43 m/s
Area-P 1/2: 3.91 cm2
Height: 69 in
MV VTI: 2.39 cm2
S' Lateral: 3.27 cm
Weight: 2920.65 oz

## 2021-06-10 LAB — HIV ANTIBODY (ROUTINE TESTING W REFLEX): HIV Screen 4th Generation wRfx: NONREACTIVE

## 2021-06-10 LAB — RAPID URINE DRUG SCREEN, HOSP PERFORMED
Amphetamines: NOT DETECTED
Barbiturates: NOT DETECTED
Benzodiazepines: POSITIVE — AB
Cocaine: NOT DETECTED
Opiates: NOT DETECTED
Tetrahydrocannabinol: POSITIVE — AB

## 2021-06-10 LAB — MRSA NEXT GEN BY PCR, NASAL: MRSA by PCR Next Gen: NOT DETECTED

## 2021-06-10 LAB — TSH: TSH: 2.672 u[IU]/mL (ref 0.350–4.500)

## 2021-06-10 LAB — MAGNESIUM: Magnesium: 2.1 mg/dL (ref 1.7–2.4)

## 2021-06-10 MED ORDER — ACETAMINOPHEN 650 MG RE SUPP: 650.0000 mg | Freq: Four times a day (QID) | RECTAL | Status: DC | PRN

## 2021-06-10 MED ORDER — ATROPINE SULFATE 1 MG/10ML IJ SOSY
0.5000 mg | PREFILLED_SYRINGE | INTRAMUSCULAR | Status: DC | PRN
  Filled 2021-06-10: qty 10

## 2021-06-10 MED ORDER — ALPRAZOLAM 0.5 MG PO TABS
1.0000 mg | ORAL_TABLET | Freq: Every evening | ORAL | Status: DC | PRN
  Administered 2021-06-10 – 2021-06-11 (×2): 1 mg via ORAL
  Filled 2021-06-10 (×2): qty 2

## 2021-06-10 MED ORDER — SODIUM CHLORIDE 0.9% FLUSH
3.0000 mL | Freq: Two times a day (BID) | INTRAVENOUS | Status: DC
  Administered 2021-06-10 – 2021-06-11 (×4): 3 mL via INTRAVENOUS

## 2021-06-10 MED ORDER — LORAZEPAM 2 MG/ML IJ SOLN: 1.0000 mg | INTRAMUSCULAR | Status: DC | PRN

## 2021-06-10 MED ORDER — OXYCODONE HCL 5 MG PO TABS
5.0000 mg | ORAL_TABLET | ORAL | Status: DC | PRN
  Administered 2021-06-10 – 2021-06-12 (×8): 5 mg via ORAL
  Filled 2021-06-10 (×8): qty 1

## 2021-06-10 MED ORDER — ONDANSETRON HCL 4 MG PO TABS: 4.0000 mg | ORAL_TABLET | Freq: Four times a day (QID) | ORAL | Status: DC | PRN

## 2021-06-10 MED ORDER — ONDANSETRON HCL 4 MG/2ML IJ SOLN: 4.0000 mg | Freq: Four times a day (QID) | INTRAMUSCULAR | Status: DC | PRN

## 2021-06-10 MED ORDER — LACTATED RINGERS IV SOLN: INTRAVENOUS | Status: DC

## 2021-06-10 MED ORDER — ACETAMINOPHEN 325 MG PO TABS: 650.0000 mg | ORAL_TABLET | Freq: Four times a day (QID) | ORAL | Status: DC | PRN

## 2021-06-10 MED ORDER — SENNOSIDES-DOCUSATE SODIUM 8.6-50 MG PO TABS: 1.0000 | ORAL_TABLET | Freq: Every evening | ORAL | Status: DC | PRN

## 2021-06-10 NOTE — TOC Initial Note (Signed)
Transition of Care [facility]) - Initial/Assessment Note    Patient Details  Name: Mckenzie Baker MRN: [id number] Date of Birth: 09/13/1976  Transition of Care [facility]) CM/SW Contact:    Leitha Bleak, RN Phone Number: 06/10/2021, 12:52 PM  Clinical Narrative:  Patient admitted with bradycardia, needs Echo and back films for back pain. TOC consulted, patient has no insurance and is concerned about paying her bill.  TOC confirmed that patient is on [facility] list to review.             Expected Discharge Plan: Home/Self Care Barriers to Discharge: Continued Medical Work up  Patient Goals and CMS Choice Patient states their goals for this hospitalization and ongoing recovery are:: to get better. CMS Medicare.gov Compare Post Acute Care list provided to:: Patient   Expected Discharge Plan and Services Expected Discharge Plan: Home/Self Care       Prior Living Arrangements/Services    Activities of Daily Living Home Assistive Devices/Equipment: None ADL Screening (condition at time of admission) Patient's cognitive ability adequate to safely complete daily activities?: Yes Is the patient deaf or have difficulty hearing?: No Does the patient have difficulty seeing, even when wearing glasses/contacts?: No Does the patient have difficulty concentrating, remembering, or making decisions?: No Patient able to express need for assistance with ADLs?: Yes Does the patient have difficulty dressing or bathing?: No Independently performs ADLs?: Yes (appropriate for developmental age) Does the patient have difficulty walking or climbing stairs?: No Weakness of Legs: Both Weakness of Arms/Hands: None  Permission Sought/Granted     Emotional Assessment   Admission diagnosis:  Bradycardia [R00.1] Seizure (HCC) [R56.9] Bradycardia with 31-40 beats per minute [R00.1] Patient Active Problem List   Diagnosis Date Noted   Bradycardia with 31-40 beats per minute 06/09/2021   Witnessed  seizure-like activity (HCC) 06/09/2021   Cannabis abuse, daily use 06/09/2021   Chronic neck pain 06/09/2021   HNP (herniated nucleus pulposus), lumbar 10/30/2014   PCP:  Gareth Morgan, MD Pharmacy:   [facility] # [address]  - [address] [place] [address] Phone: [phone] Fax: [phone]   Readmission Risk Interventions Readmission Risk Prevention Plan 06/10/2021  Medication Screening Complete  Transportation Screening Complete  Some recent data might be hidden

## 2021-06-10 NOTE — Progress Notes (Signed)
PROGRESS NOTE    Mckenzie Baker  RCB:[id number] DOB: Oct 12, 1975 DOA: 06/09/2021 PCP: Mckenzie Morgan, MD   Brief Narrative:  45 yo WF with hx of anxiety, chronic neck pain, who lives in [place], [place](currently visiting her mother and her daughter), presents to the ER today after having a witnessed seizure-like activity at home.  Patient is visiting her mother and her daughter.  Patient's daughter lives with the patient's mother (grandmother and granddaughter live together).  Patient states that she woke up this morning feeling somewhat tired.  She spent most the day watching TV.   Patient states that she has felt warm most of the day.  She not have a fever that she knows of.  She has not had any diarrhea, cough.  Around 4-5 o'clock today patient was standing near the stove making dinner for herself.  The patient's niece who is also a local EMT was at the house.  The EMT witnessed the patient standing at the stove then suddenly slumped over.  She was slumped over the stove and then fell backwards.  EMT states the patient was having seizure-like activity.  Patient's pupils were dilated.  She had tremors of her upper extremities and lower extremities.  Her lower extremities were stiff.  This lasted for approximately 30 seconds.  Patient was postictal afterwards.  Patient did not have any urinary incontinence.  Patient does not recall any events after standing near the stove.  She denies having a history of seizure disorder.  Patient does take Xanax every day but has not missed any doses.  Patient states that she did wean herself off of Adderall this month.  Patient states that she does not drink any alcohol.  She does smoke marijuana every day.  In the ER, patient noted to be profoundly bradycardic with heart rates in the high 30s and 40s.  This was captured on EKG.  It appears to be in atrial based rhythm although to be inverted in leads II and III.   ER providers contacted cardiology who did not  think the patient need to be transferred to main campus.   Due to the patient's persistent bradycardia, [facility] hospitalist contacted for admission.  During the interview process with the patient and her mother, the patient had persistent heart rates in the low 30s as.  As low as 32 bpm.  Patient's lips were somewhat cyanotic.  She was diaphoretic.  Patient states that she did not feel well was having some chest pain.  0.5 mg of IV atropine were given with increase in her heart rates to the 80s and 90s beats per minute.  Patient be admitted to the intensive care unit for further monitoring.    ED Course: CT head negative, labs workup negative. Pt noted to be persistently bradycardic with HR in the low 30s, pt was diaphoretic with cyanosis of the lips. 0.5 mg IV atropine given with increase in her HR to 80-90 bpm   Subjective: Afebrile overnight.  States has been having increasing back pain over the last 2 years.  S/p L-spine fusion~7 years ago has not seen her neurosurgeon Dr. Robyne Baker since her surgery.  Nor has patient been seen by neurologist.  States this is secondary to not having any insurance.  With increasing pain has also increased lower extremity weakness, and paresthesia     Assessment & Plan:  Covid vaccination; vaccinated [employer] Baker [employer] Laural Baker)   Principal Problem:   Bradycardia with 31-40 beats per minute Active Problems:   Witnessed  seizure-like activity (HCC)   Cannabis abuse, daily use   Chronic neck pain   Bradycardia with 31-40 beats per minute Admit to ICU. Pacer pads. Continue with IVF.  0.5 mg IV atropine for persistent HR <30 bpm.  Check echo. Check TSH, lyme serologies and rickettsial serologies. - 8/31 hold all nodal blocking agents - 8/31 echocardiogram pending - 8/31 cardiology present during exam awaiting results of echocardiogram, believe most likely will only need a event monitor. - 8/31 bradycardia improved  Witnessed seizure-like activity (HCC) Pt has  been taking her xanax daily. Has not missed any doses. May need neurology consult in the AM and EEG. - Currently patient A/O x4 no further signs of seizure activity.  We will hold on neurology consult   Cannabis abuse, daily use -Daily marijuana use ---> nausea secondary to Cannabis Hyperemesis Syndrome?.   -Her nausea could possibly cause her to hyperstimulate her vagus nerve. Will give 1 dose of droperidol to see if this helps with her nausea.  -Pt states she does not like hot showers because it makes her light headed, which would argue against hyperemesis syndrome.  Cervical pain/Lumbar fusion: Increasing lower extremity weakness/loss sensation -Increasing cervical pain and L-spine pain. - If patient has impingement of nerves in cervical area may be contributing/causing bradycardia/seizure activity. - Increasing lower extremity weakness/paresthesia: Secondary to hardware failure?  Worsening DJD/disc protrusion? -Obtain C-spine, T-spine, L-spine x-ray - Obtain L-spine, T-spine, C-spine MRI   DVT prophylaxis: SCD Code Status: Full Family Communication: 8/31 mother at bedside for discussion of plan of care all questions answered Status is: Inpatient    Dispo: The patient is from: Was staying with mother              Anticipated d/c is to: To mothers vs SNF              Anticipated d/c date is: 3 days              Patient currently is not medically stable to d/c.      Consultants:  Cardiology  Procedures/Significant Events:     I have personally reviewed and interpreted all radiology studies and my findings are as above.  VENTILATOR SETTINGS:    Cultures 8/30 SARS coronavirus negative Influenza A/B negative  Antimicrobials:    Devices    LINES / TUBES:      Continuous Infusions:  lactated ringers 50 mL/hr at 06/10/21 0113     Objective: Vitals:   06/10/21 0315 06/10/21 0320 06/10/21 0325 06/10/21 0607  BP:      Pulse: (!) 54 (!) 57 (!) 56   Resp:  Temp:    98.6 F (37 C)  TempSrc:    Oral  SpO2: 98% 97% 97%   Weight:      Height:        Intake/Output Summary (Last 24 hours) at 06/10/2021 0703 Last data filed at 06/10/2021 0111 Gross per 24 hour  Intake 1010 ml  Output --  Net 1010 ml   Filed Weights   06/09/21 1900 06/10/21 0107  Weight: 74.8 kg 82.8 kg    Examination:  General: A/O x4, No acute respiratory distress Eyes: negative scleral hemorrhage, negative anisocoria, negative icterus ENT: Negative Runny nose, negative gingival bleeding, Neck:  Negative scars, masses, torticollis, lymphadenopathy, JVD positive tenderness to palpation midline C5-C7 Lungs: Clear to auscultation bilaterally without wheezes or crackles Cardiovascular: Regular rate and rhythm without murmur gallop or rub normal S1 and S2  Abdomen: negative abdominal pain, nondistended, positive soft, bowel sounds, no rebound, no ascites, no appreciable mass Extremities: No significant cyanosis, clubbing, or edema bilateral lower extremities Skin: Negative rashes, lesions, ulcers Psychiatric:  Negative depression, negative anxiety, negative fatigue, negative mania  Central nervous system:  Cranial nerves II through XII intact, tongue/uvula midline, all extremities muscle strength 5/5, except bilateral lower extremity muscle strength 4/5, sensation intact throughout, except bilateral lower extremity sensation decreased to touch (patient cannot feel the pressure but does not feel sharp touch), negative dysarthria, negative expressive aphasia, negative receptive aphasia.  .     Data Reviewed: Care during the described time interval was provided by me .  I have reviewed this patient's available data, including medical history, events of note, physical examination, and all test results as part of my evaluation.   CBC: Recent Labs  Lab 06/09/21 1923 06/10/21 0427  WBC 13.5* 16.9*  NEUTROABS  --  14.8*  HGB 14.4 12.0  HCT 42.7 35.2*  MCV 93.6  93.6  PLT 292 213   Basic Metabolic Panel: Recent Labs  Lab 06/09/21 1923 06/10/21 0427  NA 136 136  K 3.7 3.9  CL 103 106  CO2 23 24  GLUCOSE 170* 106*  BUN 14 12  CREATININE 1.21* 0.82  CALCIUM 9.2 8.2*  MG 2.8* 2.1   GFR: Estimated Creatinine Clearance: 101.7 mL/min (by C-G formula based on SCr of 0.82 mg/dL). Liver Function Tests: Recent Labs  Lab 06/09/21 1923 06/10/21 0427  AST 37 20  ALT <5 16  ALKPHOS 78 59  BILITOT 0.5 0.4  PROT 7.8 6.3*  ALBUMIN 4.5 3.6   Recent Labs  Lab 06/09/21 1923  LIPASE 19   No results for input(s): AMMONIA in the last 168 hours. Coagulation Profile: No results for input(s): INR, PROTIME in the last 168 hours. Cardiac Enzymes: No results for input(s): CKTOTAL, CKMB, CKMBINDEX, TROPONINI in the last 168 hours. BNP (last 3 results) No results for input(s): PROBNP in the last 8760 hours. HbA1C: No results for input(s): HGBA1C in the last 72 hours. CBG: Recent Labs  Lab 06/09/21 1904  GLUCAP 156*   Lipid Profile: No results for input(s): CHOL, HDL, LDLCALC, TRIG, CHOLHDL, LDLDIRECT in the last 72 hours. Thyroid Function Tests: Recent Labs    06/09/21 1924  TSH 2.672   Anemia Panel: No results for input(s): VITAMINB12, FOLATE, FERRITIN, TIBC, IRON, RETICCTPCT in the last 72 hours. Urine analysis: No results found for: COLORURINE, APPEARANCEUR, LABSPEC, PHURINE, GLUCOSEU, HGBUR, BILIRUBINUR, KETONESUR, PROTEINUR, UROBILINOGEN, NITRITE, LEUKOCYTESUR Sepsis Labs: @LABRCNTIP (procalcitonin:4,lacticidven:4)  ) Recent Results (from the past 240 hour(s))  Resp Panel by RT-PCR (Flu A&B, Covid) Nasopharyngeal Swab     Status: None   Collection Time: 06/09/21  8:54 PM   Specimen: Nasopharyngeal Swab; Nasopharyngeal(NP) swabs in vial transport medium  Result Value Ref Range Status   SARS Coronavirus 2 by RT PCR NEGATIVE NEGATIVE Final    Comment: (NOTE) SARS-CoV-2 target nucleic acids are NOT DETECTED.  The SARS-CoV-2 RNA  is generally detectable in upper respiratory specimens during the acute phase of infection. The lowest concentration of SARS-CoV-2 viral copies this assay can detect is 138 copies/mL. A negative result does not preclude SARS-Cov-2 infection and should not be used as the sole basis for treatment or other patient management decisions. A negative result may occur with  improper specimen collection/handling, submission of specimen other than nasopharyngeal swab, presence of viral mutation(s) within the areas targeted by this assay, and inadequate number of viral copies(<138  copies/mL). A negative result must be combined with clinical observations, patient history, and epidemiological information. The expected result is Negative.  Fact Sheet for Patients:  BloggerCourse.comhttps://www.fda.gov/media/152166/download  Fact Sheet for Healthcare Providers:  SeriousBroker.ithttps://www.fda.gov/media/152162/download  This test is no t yet approved or cleared by the [place] FDA and  has been authorized for detection and/or diagnosis of SARS-CoV-2 by FDA under an Emergency Use Authorization (EUA). This EUA will remain  in effect (meaning this test can be used) for the duration of the COVID-19 declaration under Section 564(b)(1) of the Act, 21 U.S.C.section 360bbb-3(b)(1), unless the authorization is terminated  or revoked sooner.       Influenza A by PCR NEGATIVE NEGATIVE Final   Influenza B by PCR NEGATIVE NEGATIVE Final    Comment: (NOTE) The Xpert Xpress SARS-CoV-2/FLU/RSV plus assay is intended as an aid in the diagnosis of influenza from Nasopharyngeal swab specimens and should not be used as a sole basis for treatment. Nasal washings and aspirates are unacceptable for Xpert Xpress SARS-CoV-2/FLU/RSV testing.  Fact Sheet for Patients: BloggerCourse.comhttps://www.fda.gov/media/152166/download  Fact Sheet for Healthcare Providers: SeriousBroker.ithttps://www.fda.gov/media/152162/download  This test is not yet approved or cleared by the  [place] FDA and has been authorized for detection and/or diagnosis of SARS-CoV-2 by FDA under an Emergency Use Authorization (EUA). This EUA will remain in effect (meaning this test can be used) for the duration of the COVID-19 declaration under Section 564(b)(1) of the Act, 21 U.S.C. section 360bbb-3(b)(1), unless the authorization is terminated or revoked.  Performed at [facility], [address] [place], [place] [address]          Radiology Studies: CT HEAD WO CONTRAST (5MM)  Result Date: 06/09/2021 CLINICAL DATA:  Status post seizure. EXAM: CT HEAD WITHOUT CONTRAST TECHNIQUE: Contiguous axial images were obtained from the base of the skull through the vertex without intravenous contrast. COMPARISON:  None. FINDINGS: Brain: No evidence of acute infarction, hemorrhage, hydrocephalus, extra-axial collection or mass lesion/mass effect. Vascular: No hyperdense vessel or unexpected calcification. Skull: Normal. Negative for fracture or focal lesion. Sinuses/Orbits: There is marked severity right maxillary sinus mucosal thickening. Other: None. IMPRESSION: 1. No acute intracranial abnormality. 2. Marked severity right maxillary sinus mucosal thickening. An underlying polyp versus mucous retention cyst can not be excluded. Electronically Signed   By: Aram Candelahaddeus  Houston M.D.   On: 06/09/2021 20:23   CT Cervical Spine Wo Contrast  Result Date: 06/09/2021 CLINICAL DATA:  Status post seizure. EXAM: CT CERVICAL SPINE WITHOUT CONTRAST TECHNIQUE: Multidetector CT imaging of the cervical spine was performed without intravenous contrast. Multiplanar CT image reconstructions were also generated. COMPARISON:  None. FINDINGS: Alignment: Normal. Skull base and vertebrae: No acute fracture. No primary bone lesion or focal pathologic process. Soft tissues and spinal canal: No prevertebral fluid or swelling. No visible canal hematoma. Disc levels: Marked severity endplate sclerosis and anterior osteophyte  formation is seen at the levels of C5-C6 and C6-C7. Mild intervertebral disc space narrowing is seen at the levels of C5-C6 and C6-C7. Normal, bilateral multilevel facet joints are noted. Upper chest: Negative. Other: None. IMPRESSION: 1. Marked severity degenerative changes at the levels of C5-C6 and C6-C7. 2. No evidence of an acute fracture or subluxation. Electronically Signed   By: Aram Candelahaddeus  Houston M.D.   On: 06/09/2021 20:25   DG Chest Portable 1 View  Result Date: 06/09/2021 CLINICAL DATA:  Vomiting and seizures. EXAM: PORTABLE CHEST 1 VIEW COMPARISON:  November 23, 2013 FINDINGS: The heart size and mediastinal contours are within normal  limits. Both lungs are clear. The visualized skeletal structures are unremarkable. IMPRESSION: No active cardiopulmonary disease. Electronically Signed   By: Aram Candela M.D.   On: 06/09/2021 20:36        Scheduled Meds:  Chlorhexidine Gluconate Cloth  6 each Topical Q0600   sodium chloride flush  3 mL Intravenous Q12H   Continuous Infusions:  lactated ringers 50 mL/hr at 06/10/21 0113     LOS: 1 day   The patient is critically ill with multiple organ systems failure and requires high complexity decision making for assessment and support, frequent evaluation and titration of therapies, application of advanced monitoring technologies and extensive interpretation of multiple databases. Critical Care Time devoted to patient care services described in this note  Time spent: 40 minutes     Aerica Rincon, Roselind Messier, MD [facility]   If 7PM-7AM, please contact night-coverage 06/10/2021, 7:03 AM

## 2021-06-10 NOTE — Progress Notes (Signed)
eLink Physician-Brief Progress Note Patient Name: Mckenzie Baker DOB: 14-May-1976 MRN: [id number]   Date of Service  06/10/2021  HPI/Events of Note  Patient admitted with "seizure", syncope and sinus bradycardia, she has a history of anxiety for which she takes Xanax every day, she also smokes Marijuana every day, she denies missing any Xanax doses per documentation in the chart. Seizure-like activity was not accompanied by loss of bladder or bowel control. Bradycardia resolved with 0.5 mg of iv Atropine.Work up is in progress.  eICU Interventions  New Patient Evaluation, EEG, brain imaging, telemetry, cardiology and neurology consultation, seizure precautions.        Thomasene Lot Andy Moye 06/10/2021, 1:34 AM

## 2021-06-10 NOTE — Consult Note (Addendum)
Cardiology Consultation:   Patient ID: Mckenzie Baker MRN: [id number]; DOB: 15-Jun-1976  Admit date: 06/09/2021 Date of Consult: 06/10/2021  PCP:  Gareth Morgan, MD   [facility] Providers Cardiologist:  New to [facility] [facility]  Patient Profile:   Mckenzie Baker is a 45 y.o. female with a hx of cannabis use, chronic back pain and no prior cardiac history who is being seen 06/10/2021 for the evaluation of bradycardia at the request of Dr. Joseph Art.  History of Present Illness:   Mckenzie Baker presented to [facility] ED on 06/09/2021 for evaluation of seizure-like activity for reportedly lasted for 30 seconds. In talking with the patient and Mckenzie Baker mother today, Mckenzie Baker traveled from [place] to [place] to visit family for the week. Mckenzie Baker was in the kitchen boiling water when Mckenzie Baker developed intense pain along Mckenzie Baker back and neck. Mckenzie Baker mother witnessed Mckenzie Baker start to shake and "collapse in" then fall to the floor. Mckenzie Baker was shaking for 30-60 seconds and was very confused afterwards. Per reports, Mckenzie Baker did not recognize several family members. They also noted Mckenzie Baker pupils were dilated. No loss of bowel or bladder incontinence. Mckenzie Baker does report intermittent dizziness when taking a hot shower but no prior syncopal episodes. While in the ED, Mckenzie Baker was still confused but this started to improve. Reported worsening nausea and was noted to have bradycardia as outlined below. Unaware of any personal history of bradycardia or tachy-arrhythmias. No prior history of seizures. Mckenzie Baker mother who is at the bedside says Mckenzie Baker is currently wearing an event monitor and has a murmur that is being followed.  Initial labs showed WBC 13.5, Hgb 14.4, platelets 292, Na+ 136, K+ 3.7 and creatinine 1.21. Mg 2.8. TSH 2.672 with Free T4 pending. Lyme and [facility] Spotted Fever serology pending. UDS positive for THC and benzodiazepines. CXR with no acute cardiopulmonary disease. CT Head showed no acute intracranial abnormalities. EKG showed bradycardia  with occasional p-waves but cannot rule-out junctional beats, HR 38 with PVC's. Mckenzie Baker HR would drop into the 30's while in the ED and Mckenzie Baker did receive IV Atropine 0.5mg  x1.   Mckenzie Baker reports improvement in Mckenzie Baker symptoms overnight. Mckenzie Baker has been followed on telemetry and HR has been in the 50's to 60's overnight with occasional PAC's. Increases into the 70's when talking.    Past Medical History:  Diagnosis Date   Anxiety    Bell's palsy    Chronic back pain    Depression    Fibromyalgia    GERD (gastroesophageal reflux disease)    Headache    History of IBS    Wears glasses     Past Surgical History:  Procedure Laterality Date   DILATION AND CURETTAGE OF UTERUS     LUMBAR LAMINECTOMY/DECOMPRESSION MICRODISCECTOMY Bilateral 03/30/2013   Procedure: LUMBAR LAMINECTOMY/DECOMPRESSION MICRODISCECTOMY 1 LEVEL;  Surgeon: Hewitt Shorts, MD;  Location: [facility];  Service: Neurosurgery;  Laterality: Bilateral;  bilateral L45 laminotomy and microdiskectomy   TOOTH EXTRACTION       Home Medications:  Prior to Admission medications   Medication Sig Start Date End Date Taking? Authorizing Provider  ALPRAZolam Prudy Feeler) 1 MG tablet Take 1 mg by mouth at bedtime as needed for anxiety.   Yes [provider]  methocarbamol (ROBAXIN) 500 MG tablet Take by mouth. 05/02/20  Yes [provider]  oxyCODONE-acetaminophen (PERCOCET/ROXICET) 5-325 MG tablet Take 1 tablet by mouth every 8 (eight) hours as needed for severe pain. 01/06/16  Yes Zadie Rhine, MD  ALPRAZolam Prudy Feeler) 1 MG  tablet Take by mouth.    [provider]  amphetamine-dextroamphetamine (ADDERALL) 20 MG tablet Take 20 mg by mouth 2 (two) times daily as needed (for improving focus).  Patient not taking: Reported on 06/09/2021    [provider]  aspirin 325 MG tablet Take 325 mg by mouth daily. Patient not taking: Reported on 06/09/2021    [provider]  carisoprodol (SOMA) 350 MG tablet Take 350  mg by mouth at bedtime as needed for muscle spasms. Patient not taking: Reported on 06/09/2021    [provider]  naproxen (NAPROSYN) 500 MG tablet Take by mouth. Patient not taking: No sig reported 05/02/20   [provider]  Norethindrone Acetate-Ethinyl Estrad-FE (LOESTRIN 24 FE) 1-20 MG-MCG(24) tablet Take 1 tablet by mouth daily. Patient not taking: No sig reported 01/07/16   Tilda Burrow, MD  ondansetron (ZOFRAN) 4 MG tablet Take by mouth. Patient not taking: No sig reported 11/25/16   [provider]  temazepam (RESTORIL) 30 MG capsule Take 60 mg by mouth at bedtime as needed for sleep.  Patient not taking: Reported on 06/09/2021    [provider]    Inpatient Medications: Scheduled Meds:  Chlorhexidine Gluconate Cloth  6 each Topical Q0600   sodium chloride flush  3 mL Intravenous Q12H   Continuous Infusions:  lactated ringers 50 mL/hr at 06/10/21 0113   PRN Meds: acetaminophen **OR** acetaminophen, ALPRAZolam, atropine, LORazepam, ondansetron **OR** ondansetron (ZOFRAN) IV, oxyCODONE, senna-docusate  Allergies:   No Known Allergies  Social History:   Social History   Socioeconomic History   Marital status: Married    Spouse name: Not on file   Number of children: Not on file   Years of education: Not on file   Highest education level: Not on file  Occupational History   Not on file  Tobacco Use   Smoking status: Every Day    Packs/day: 0.25    Types: Cigarettes   Smokeless tobacco: Never  Vaping Use   Vaping Use: Some days  Substance and Sexual Activity   Alcohol use: Yes    Comment: occasional   Drug use: Yes    Types: Marijuana   Sexual activity: Not Currently  Other Topics Concern   Not on file  Social History Narrative   Not on file   Social Determinants of Health   Financial Resource Strain: Not on file  Food Insecurity: Not on file  Transportation Needs: Not on file  Physical Activity: Not on file  Stress:  Not on file  Social Connections: Not on file  Intimate Partner Violence: Not on file    Family History:    Family History  Problem Relation Age of Onset   Cancer Father    Alcoholism Father    Diabetes Sister      ROS:  Please see the history of present illness.   All other ROS reviewed and negative.     Physical Exam/Data:   Vitals:   06/10/21 0607 06/10/21 0721 06/10/21 0750 06/10/21 0800  BP:  (!) 102/41  (!) 117/48  Pulse:  (!) 59 (!) 49 (!) 51  Resp:  15 17 17   Temp: 98.6 F (37 C)  98.7 F (37.1 C)   TempSrc: Oral  Oral   SpO2:  98% 100% 100%  Weight:      Height:        Intake/Output Summary (Last 24 hours) at 06/10/2021 0916 Last data filed at 06/10/2021 0111 Gross per 24 hour  Intake 1010 ml  Output --  Net 1010 ml   Last 3 Weights 06/10/2021 06/09/2021 02/04/2016  Weight (lbs) 182 lb 8.7 oz 165 lb 168 lb  Weight (kg) 82.8 kg 74.844 kg 76.204 kg     Body mass index is 26.96 kg/m.  General:  Well nourished, well developed female appearing in no acute distress HEENT: normal Lymph: no adenopathy Neck: no JVD Endocrine:  No thryomegaly Vascular: No carotid bruits; FA pulses 2+ bilaterally without bruits  Cardiac:  normal S1, S2; RRR; no murmur. Lungs:  clear to auscultation bilaterally, no wheezing, rhonchi or rales  Abd: soft, nontender, no hepatomegaly  Ext: no edema Musculoskeletal:  No deformities, BUE and BLE strength normal and equal Skin: warm and dry  Neuro:  CNs 2-12 intact, no focal abnormalities noted Psych:  Normal affect   EKG:  The EKG was personally reviewed and demonstrates: Bradycardia with occasional p-waves but cannot rule-out junctional beats, HR 38 with PVC's. Telemetry:  Telemetry was personally reviewed and demonstrates: NSR, HR in 50's to 60's overnight with occasional PAC's. Increases into the 70's when talking.   Relevant CV Studies:  EKG: 04/2018 - Sinus bradycardia with frequent Premature ventricular complexes in a pattern  of bigeminy  Nonspecific ST abnormality   Laboratory Data:  High Sensitivity Troponin:  No results for input(s): TROPONINIHS in the last 720 hours.   Chemistry Recent Labs  Lab 06/09/21 1923 06/10/21 0427  NA 136 136  K 3.7 3.9  CL 103 106  CO2 23 24  GLUCOSE 170* 106*  BUN 14 12  CREATININE 1.21* 0.82  CALCIUM 9.2 8.2*  GFRNONAA 57* >60  ANIONGAP 10 6    Recent Labs  Lab 06/09/21 1923 06/10/21 0427  PROT 7.8 6.3*  ALBUMIN 4.5 3.6  AST 37 20  ALT <5 16  ALKPHOS 78 59  BILITOT 0.5 0.4   Hematology Recent Labs  Lab 06/09/21 1923 06/10/21 0427  WBC 13.5* 16.9*  RBC 4.56 3.76*  HGB 14.4 12.0  HCT 42.7 35.2*  MCV 93.6 93.6  MCH 31.6 31.9  MCHC 33.7 34.1  RDW 12.2 12.2  PLT 292 213   BNPNo results for input(s): BNP, PROBNP in the last 168 hours.  DDimer No results for input(s): DDIMER in the last 168 hours.   Radiology/Studies:  CT HEAD WO CONTRAST (5MM)  Result Date: 06/09/2021 CLINICAL DATA:  Status post seizure. EXAM: CT HEAD WITHOUT CONTRAST TECHNIQUE: Contiguous axial images were obtained from the base of the skull through the vertex without intravenous contrast. COMPARISON:  None. FINDINGS: Brain: No evidence of acute infarction, hemorrhage, hydrocephalus, extra-axial collection or mass lesion/mass effect. Vascular: No hyperdense vessel or unexpected calcification. Skull: Normal. Negative for fracture or focal lesion. Sinuses/Orbits: There is marked severity right maxillary sinus mucosal thickening. Other: None. IMPRESSION: 1. No acute intracranial abnormality. 2. Marked severity right maxillary sinus mucosal thickening. An underlying polyp versus mucous retention cyst can not be excluded. Electronically Signed   By: Aram Candelahaddeus  Houston M.D.   On: 06/09/2021 20:23   CT Cervical Spine Wo Contrast  Result Date: 06/09/2021 CLINICAL DATA:  Status post seizure. EXAM: CT CERVICAL SPINE WITHOUT CONTRAST TECHNIQUE: Multidetector CT imaging of the cervical spine was  performed without intravenous contrast. Multiplanar CT image reconstructions were also generated. COMPARISON:  None. FINDINGS: Alignment: Normal. Skull base and vertebrae: No acute fracture. No primary bone lesion or focal pathologic process. Soft tissues and spinal canal: No prevertebral fluid or swelling. No visible canal hematoma. Disc levels:  Marked severity endplate sclerosis and anterior osteophyte formation is seen at the levels of C5-C6 and C6-C7. Mild intervertebral disc space narrowing is seen at the levels of C5-C6 and C6-C7. Normal, bilateral multilevel facet joints are noted. Upper chest: Negative. Other: None. IMPRESSION: 1. Marked severity degenerative changes at the levels of C5-C6 and C6-C7. 2. No evidence of an acute fracture or subluxation. Electronically Signed   By: Aram Candela M.D.   On: 06/09/2021 20:25   DG Chest Portable 1 View  Result Date: 06/09/2021 CLINICAL DATA:  Vomiting and seizures. EXAM: PORTABLE CHEST 1 VIEW COMPARISON:  November 23, 2013 FINDINGS: The heart size and mediastinal contours are within normal limits. Both lungs are clear. The visualized skeletal structures are unremarkable. IMPRESSION: No active cardiopulmonary disease. Electronically Signed   By: Aram Candela M.D.   On: 06/09/2021 20:36     Assessment and Plan:   1. Bradycardia - New diagnosis for the patient following a syncopal event which could have been vasovagal following Mckenzie Baker worsening pain but work-up for possible seizure is pending as well.  - Mckenzie Baker HR was in the 30's at times while in the ED but no high-grade AV Block or significant pauses. Did report nausea since Mckenzie Baker episode. Received Atropine x1 but has been in sinus bradycardia overnight with HR in the 50's to 60's. HR does go into the 70's with talking. Would check HR response with ambulation prior to discharge.  - TSH and electrolytes WNL. Lyme and RMSF titers pending but presentation is not consistent with Lyme carditis.  Echocardiogram is pending to assess LV function and wall motion.  - Would continue to follow on telemetry. Avoid AV nodal blocking agents. Can consider an event monitor as an outpatient.  2. Syncope vs. Seizure - Experienced an episode of LOC yesterday with associated muscle jerking and confusion but no bowel or bladder incontinence. No known history of seizures. No recurrence since admission. Further management per the admitting team.    For questions or updates, please contact [facility] Please consult [email or web address] for contact info under    Signed, Ellsworth Lennox, PA-C  06/10/2021 9:16 AM    Attending note:  Patient seen and examined.  I reviewed Mckenzie Baker records and discussed the case with Patrick Jupiter, I agree with Mckenzie Baker above findings.  Mckenzie Baker presents to the hospital after an episode of syncope that occurred while standing after the onset of significant back pain and suggestive of a vasovagal event based on description.  Mckenzie Baker did have apparent seizure-like activity after Mckenzie Baker had fallen to the floor which could be consistent with neurological hypoperfusion at the time rather than true seizure activity.  Mckenzie Baker has no previous history of seizures.  Does state that Mckenzie Baker feels lightheaded sometimes with pain, also when Mckenzie Baker takes a hot shower.  Mckenzie Baker has never had an event like this specifically.  Mckenzie Baker states that Mckenzie Baker lives with Mckenzie Baker husband in [place], but travels to [place] to visit family regularly and that Mckenzie Baker PCP is Dr. Sudie Bailey.  Mckenzie Baker mother also mentions that Mckenzie Baker has a history of vasovagal syncope, ILR in place with some discussion in the past about whether Mckenzie Baker would need a pacemaker, and followed by Dr. Graciela Husbands as well as Dr. Antoine Poche.  Patient also with cannabis use, UDS positive for THC.  Mckenzie Baker states that Mckenzie Baker has not been eating well recently.  On examination this morning Mckenzie Baker appears comfortable.  Mckenzie Baker is afebrile.  Heart rate in the 50s to 60s in sinus rhythm  by telemetry this  morning, personally reviewed.  Systolic pressure 80s to 110s.  Lungs are clear.  Cardiac exam reveals RRR without gallop, soft systolic murmur.  Pertinent lab work includes potassium 3.9, BUN 12, creatinine 0.82, WBC 16.9, hemoglobin 12.0, platelets 213, TSH 2.67, SARS coronavirus 2 test negative, UDS positive for benzodiazepines and THC.  Chest x-ray reports no acute process.  I personally reviewed Mckenzie Baker recent ECGs showing sinus bradycardia with isorhythmic dissociation along with junctional rhythm, Mckenzie Baker also has a low atrial rhythm evident.  No obvious high degree heart block however.  Intermittent PVCs.  Tracing from 2016 showed sinus rhythm in the 90s with normal intervals.  At this point vasovagal syncope suspected per description of event.  Mckenzie Baker does have bradycardia with low atrial rhythm evident by subsequent ECGs, no history of previous cardiac arrhythmia or heart block.  TSH is normal.  Mckenzie Baker has a history of chronic pain and is on Percocet as an outpatient.  We will review echocardiogram already ordered.  Anticipate further outpatient monitoring, we will likely arrange a 14-day Zio patch after discharge and then follow-up thereafter.  Jonelle Sidle, M.D., F.A.C.C.

## 2021-06-11 ENCOUNTER — Telehealth: Payer: Self-pay

## 2021-06-11 ENCOUNTER — Ambulatory Visit (INDEPENDENT_AMBULATORY_CARE_PROVIDER_SITE_OTHER): Payer: Self-pay

## 2021-06-11 DIAGNOSIS — R55 Syncope and collapse: Secondary | ICD-10-CM

## 2021-06-11 DIAGNOSIS — R001 Bradycardia, unspecified: Secondary | ICD-10-CM

## 2021-06-11 LAB — CBC WITH DIFFERENTIAL/PLATELET
Abs Immature Granulocytes: 0.03 10*3/uL (ref 0.00–0.07)
Basophils Absolute: 0 10*3/uL (ref 0.0–0.1)
Basophils Relative: 0 %
Eosinophils Absolute: 0.1 10*3/uL (ref 0.0–0.5)
Eosinophils Relative: 1 %
HCT: 36.6 % (ref 36.0–46.0)
Hemoglobin: 12.2 g/dL (ref 12.0–15.0)
Immature Granulocytes: 0 %
Lymphocytes Relative: 35 %
Lymphs Abs: 2.7 10*3/uL (ref 0.7–4.0)
MCH: 32 pg (ref 26.0–34.0)
MCHC: 33.3 g/dL (ref 30.0–36.0)
MCV: 96.1 fL (ref 80.0–100.0)
Monocytes Absolute: 0.7 10*3/uL (ref 0.1–1.0)
Monocytes Relative: 9 %
Neutro Abs: 4.4 10*3/uL (ref 1.7–7.7)
Neutrophils Relative %: 55 %
Platelets: 182 10*3/uL (ref 150–400)
RBC: 3.81 MIL/uL — ABNORMAL LOW (ref 3.87–5.11)
RDW: 12.3 % (ref 11.5–15.5)
WBC: 7.9 10*3/uL (ref 4.0–10.5)
nRBC: 0 % (ref 0.0–0.2)

## 2021-06-11 LAB — COMPREHENSIVE METABOLIC PANEL
ALT: 18 U/L (ref 0–44)
AST: 24 U/L (ref 15–41)
Albumin: 3.7 g/dL (ref 3.5–5.0)
Alkaline Phosphatase: 58 U/L (ref 38–126)
Anion gap: 4 — ABNORMAL LOW (ref 5–15)
BUN: 8 mg/dL (ref 6–20)
CO2: 30 mmol/L (ref 22–32)
Calcium: 8.8 mg/dL — ABNORMAL LOW (ref 8.9–10.3)
Chloride: 105 mmol/L (ref 98–111)
Creatinine, Ser: 0.79 mg/dL (ref 0.44–1.00)
GFR, Estimated: >60 mL/min (ref >60)
Glucose, Bld: 93 mg/dL (ref 70–99)
Potassium: 3.4 mmol/L — ABNORMAL LOW (ref 3.5–5.1)
Sodium: 139 mmol/L (ref 135–145)
Total Bilirubin: 0.5 mg/dL (ref 0.3–1.2)
Total Protein: 6.2 g/dL — ABNORMAL LOW (ref 6.5–8.1)

## 2021-06-11 LAB — PROCALCITONIN: Procalcitonin: <0.1 ng/mL

## 2021-06-11 LAB — ROCKY MTN SPOTTED FVR ABS PNL(IGG+IGM)
RMSF IgG: NEGATIVE
RMSF IgM: 0.38 index (ref 0.00–0.89)

## 2021-06-11 LAB — MAGNESIUM: Magnesium: 1.9 mg/dL (ref 1.7–2.4)

## 2021-06-11 LAB — PHOSPHORUS: Phosphorus: 3.3 mg/dL (ref 2.5–4.6)

## 2021-06-11 LAB — LYME DISEASE SEROLOGY W/REFLEX: Lyme Total Antibody EIA: NEGATIVE

## 2021-06-11 MED ORDER — NICOTINE 14 MG/24HR TD PT24
14.0000 mg | MEDICATED_PATCH | Freq: Every day | TRANSDERMAL | Status: DC
  Administered 2021-06-11 – 2021-06-12 (×2): 14 mg via TRANSDERMAL
  Filled 2021-06-11 (×2): qty 1

## 2021-06-11 NOTE — Evaluation (Signed)
hysical Therapy Evaluation Patient Details Name: ADDLEY BALLINGER MRN: [id number] DOB: [birth year] Today's Date: 06/11/2021   History of Present Illness  45 yo WF with hx of anxiety, chronic neck pain, who lives in [place], [place](currently visiting her mother and her daughter), presents to the ER today after having a witnessed seizure-like activity at home.  Patient is visiting her mother and her daughter.  Patient's daughter lives with the patient's mother (grandmother and granddaughter live together).  Patient states that she woke up this morning feeling somewhat tired.  She spent most the day watching TV.   Patient states that she has felt warm most of the day.  She not have a fever that she knows of.  She has not had any diarrhea, cough.    Around 4-5 o'clock today patient was standing near the stove making dinner for herself.  The patient's niece who is also a local EMT was at the house.  The EMT witnessed the patient standing at the stove then suddenly slumped over.  She was slumped over the stove and then fell backwards.  EMT states the patient was having seizure-like activity.  Patient's pupils were dilated.  She had tremors of her upper extremities and lower extremities.  Her lower extremities were stiff.  This lasted for approximately 30 seconds.  Patient was postictal afterwards.  Patient did not have any urinary incontinence  Clinical Impression  PT needs no skilled PT at this level.  Once she returns to home in [place] may benefit from OP PT due to chronic back pain.    Follow Up Recommendations No PT follow up    Equipment Recommendations  None recommended by PT    Recommendations for Other Services       Precautions / Restrictions Precautions Precautions: None      Mobility  Bed Mobility Overal bed mobility: Independent                  Transfers Overall transfer level: Independent                  Ambulation/Gait Ambulation/Gait assistance:  Independent Gait Distance (Feet): 300 Feet   Gait Pattern/deviations: WFL(Within Functional Limits)   Gait velocity interpretation: 1.31 - 2.62 ft/sec, indicative of limited community Insurance account manager [place] (Stroke Patients Only)       Balance                                             Pertinent Vitals/Pain Pain Assessment: 0-10 Pain Score: 6  Pain Location: back this is chronic since she was 24 Pain Descriptors / Indicators: Aching Pain Intervention(s): Limited activity within patient's tolerance;Monitored during session    Home Living Family/patient expects to be discharged to:: Private residence Living Arrangements: Spouse/significant other Available Help at Discharge: Family Type of Home: House                Prior Function                 Hand Dominance        Extremity/Trunk Assessment        Lower Extremity Assessment Lower Extremity Assessment: Overall WFL for tasks assessed       Communication      Cognition Arousal/Alertness:  Awake/alert Behavior During Therapy: WFL for tasks assessed/performed Overall Cognitive Status: Within Functional Limits for tasks assessed                                        General Comments      Exercises     Assessment/Plan    PT Assessment Patent does not need any further PT services  PT Problem List Pain       PT Treatment Interventions   none                  AM-PAC PT "6 Clicks" Mobility  Outcome Measure Help needed turning from your back to your side while in a flat bed without using bedrails?: None Help needed moving from lying on your back to sitting on the side of a flat bed without using bedrails?: None Help needed moving to and from a bed to a chair (including a wheelchair)?: None Help needed standing up from a chair using your arms (e.g., wheelchair or bedside chair)?: None Help  needed to walk in hospital room?: None   6 Click Score: 20    End of Session Equipment Utilized During Treatment: Gait belt Activity Tolerance: Patient tolerated treatment well Patient left: in bed Nurse Communication: Mobility status PT Visit Diagnosis: Pain    Time: 1430-1459 PT Time Calculation (min) (ACUTE ONLY): 29 min   Charges:   PT Evaluation $PT Eval Low Complexity: 1 Low          Virgina Organ, PT CLT [phone]  06/11/2021, 3:00 PM

## 2021-06-11 NOTE — TOC Progression Note (Signed)
Transition of Care [facility]) - Progression Note    Patient Details  Name: INAARA TYE MRN: [id number] Date of Birth: [birth year]  Transition of Care [facility]) CM/SW Contact  Leitha Bleak, RN Phone Number: 06/11/2021, 1:32 PM  Clinical Narrative:   TOC consulted for SA/ Pain control. Patient has no insurance.  TOC visited patient, Patient is visiting here from [place], which is in [facility]. TOC provided Managed Care organization for her county for her to reach out to them when she is back at home. Patient states her medicaid was denied pending her disability application. Patient does not want any SA resources. She will follow up and be more persistent in get resources needed for her care.   Expected Discharge Plan: Home/Self Care Barriers to Discharge: Continued Medical Work up Expected Discharge Plan and Services Expected Discharge Plan: Home/Self Care     Readmission Risk Interventions Readmission Risk Prevention Plan 06/10/2021  Medication Screening Complete  Transportation Screening Complete  Some recent data might be hidden

## 2021-06-11 NOTE — Telephone Encounter (Signed)
-----   Message from Mckenzie Baker, [place] sent at 06/11/2021  9:50 AM EDT ----- Regarding: 2-week Zio patch This patient needs a 2-week Zio patch for syncope and bradycardia per Dr. Diona Browner. She will likely be discharged later today so if she could have it placed before she heads out, that would be good. If not, would need to come to the office for a nurse appointment to have placed (would not mail as she lives in [place] but is currently in [place] with family).   Thanks for your help!  Best,  [place]

## 2021-06-11 NOTE — Telephone Encounter (Signed)
Zio 14 day monitor placed on patient [id number]   Instructions given for wearing it, and how to return.

## 2021-06-11 NOTE — Progress Notes (Signed)
PROGRESS NOTE    Mckenzie Baker  ZYS:[id number] DOB: November 29, 1975 DOA: 06/09/2021 PCP: Gareth Morgan, MD   Brief Narrative:  45 yo WF with hx of anxiety, chronic neck pain, who lives in [place], [place](currently visiting her mother and her daughter), presents to the ER today after having a witnessed seizure-like activity at home.  Patient is visiting her mother and her daughter.  Patient's daughter lives with the patient's mother (grandmother and granddaughter live together).  Patient states that she woke up this morning feeling somewhat tired.  She spent most the day watching TV.   Patient states that she has felt warm most of the day.  She not have a fever that she knows of.  She has not had any diarrhea, cough.  Around 4-5 o'clock today patient was standing near the stove making dinner for herself.  The patient's niece who is also a local EMT was at the house.  The EMT witnessed the patient standing at the stove then suddenly slumped over.  She was slumped over the stove and then fell backwards.  EMT states the patient was having seizure-like activity.  Patient's pupils were dilated.  She had tremors of her upper extremities and lower extremities.  Her lower extremities were stiff.  This lasted for approximately 30 seconds.  Patient was postictal afterwards.  Patient did not have any urinary incontinence.  Patient does not recall any events after standing near the stove.  She denies having a history of seizure disorder.  Patient does take Xanax every day but has not missed any doses.  Patient states that she did wean herself off of Adderall this month.  Patient states that she does not drink any alcohol.  She does smoke marijuana every day.  In the ER, patient noted to be profoundly bradycardic with heart rates in the high 30s and 40s.  This was captured on EKG.  It appears to be in atrial based rhythm although to be inverted in leads II and III.   ER providers contacted cardiology who did not  think the patient need to be transferred to main campus.   Due to the patient's persistent bradycardia, [facility] hospitalist contacted for admission.  During the interview process with the patient and her mother, the patient had persistent heart rates in the low 30s as.  As low as 32 bpm.  Patient's lips were somewhat cyanotic.  She was diaphoretic.  Patient states that she did not feel well was having some chest pain.  0.5 mg of IV atropine were given with increase in her heart rates to the 80s and 90s beats per minute.  Patient be admitted to the intensive care unit for further monitoring.    ED Course: CT head negative, labs workup negative. Pt noted to be persistently bradycardic with HR in the low 30s, pt was diaphoretic with cyanosis of the lips. 0.5 mg IV atropine given with increase in her HR to 80-90 bpm   Subjective: 9/1 A/O x4, A/O x4, negative CP, negative additional seizure signs or symptoms.  Chronic back pain.   Assessment & Plan:  Covid vaccination; vaccinated [employer] Benes)   Principal Problem:   Bradycardia with 31-40 beats per minute Active Problems:   Witnessed seizure-like activity (HCC)   Cannabis abuse, daily use   Chronic neck pain   Bradycardia with 31-40 beats per minute Admit to ICU. Pacer pads. Continue with IVF.  0.5 mg IV atropine for persistent HR <30 bpm.  Check echo. Check TSH, lyme  serologies and rickettsial serologies. - 8/31 hold all nodal blocking agents - 8/31 echocardiogram pending - 8/31 cardiology present during exam awaiting results of echocardiogram, believe most likely will only need a event monitor. - 8/31 bradycardia improved -9/1 per cardiology no further inpatient cardiac testing is planned.  We will set up a 14-day Zio patch as an outpatient and then schedule office follow-up for further review.   Witnessed seizure-like activity (HCC) Pt has been taking her xanax daily. Has not missed any doses. May need neurology consult in the AM  and EEG. - Currently patient A/O x4 no further signs of seizure activity.  We will hold on neurology consult -9/1 will obtain baseline EEG.  ADDENDUM notified unable to provide EEG today.   Cannabis abuse, daily use -Daily marijuana use ---> nausea secondary to Cannabis Hyperemesis Syndrome?.   -Her nausea could possibly cause her to hyperstimulate her vagus nerve. Will give 1 dose of droperidol to see if this helps with her nausea.  -Pt states she does not like hot showers because it makes her light headed, which would argue against hyperemesis syndrome.  Cervical pain/Lumbar fusion: Increasing lower extremity weakness/loss sensation -Increasing cervical pain and L-spine pain. - If patient has impingement of nerves in cervical area may be contributing/causing bradycardia/seizure activity. - Increasing lower extremity weakness/paresthesia: Secondary to hardware failure?  Worsening DJD/disc protrusion? -Obtain C-spine, T-spine, L-spine x-ray - Obtain L-spine, T-spine, C-spine MRI -9/1 neural foraminal narrowing at multiple levels, osteophytes, retrolithiasis in lumbar region see results below. - 9/1 PT/OT consult: Patient with increased lower extremity weakness and loss of sensation evaluate for CIR vs SNF - 9/1 social work consult: Evaluate for resources available in order that patient may see neurosurgery in follow-up as outpatient for pain control (no insurance).  Also resources to help with discontinuing use of cannabis.   DVT prophylaxis: SCD Code Status: Full Family Communication: 8/31 mother at bedside for discussion of plan of care all questions answered Status is: Inpatient    Dispo: The patient is from: Was staying with mother              Anticipated d/c is to: To mothers vs SNF              Anticipated d/c date is: 3 days              Patient currently is not medically stable to d/c.      Consultants:  Cardiology  Procedures/Significant Events:  8/31 MRI  C-spine/T-spine/L-spine W0 contrast: Degenerative changes of the cervical spine with mild spinal canal stenosis at C4-5 and C5-6. Moderate right neural foraminal narrowing at C5-6 and C6-7. 2. Mild degenerative changes of the thoracic spine without significant spinal canal or neural foraminal stenosis at any level. 3. Postsurgical changes at L4-5 without residual spinal canal or neural foraminal stenosis. 4. Degenerative changes at L3-4 with small retrolisthesis and mild narrowing of the bilateral subarticular zones. 5. Degenerative changes at L5-S1 with mild narrowing of the bilateral subarticular zones and mild bilateral neural foraminal narrowing. 6. Mild diffuse decrease of the T1 signal within the visualized spine, may represent red marrow reconversion in the setting of anemia.   I have personally reviewed and interpreted all radiology studies and my findings are as above.  VENTILATOR SETTINGS:    Cultures 8/30 SARS coronavirus negative Influenza A/B negative  Antimicrobials:    Devices    LINES / TUBES:      Continuous Infusions:  lactated ringers 50 mL/hr at  06/11/21 0753     Objective: Vitals:   06/11/21 0900 06/11/21 1000 06/11/21 1100 06/11/21 1112  BP: 130/60 134/78 (!) 129/58   Pulse: 64 (!) 51 (!) 51 (!) 54  Resp: 17 11 12 17   Temp:    98.3 F (36.8 C)  TempSrc:    Oral  SpO2: 98% 100% 97% 95%  Weight:      Height:        Intake/Output Summary (Last 24 hours) at 06/11/2021 1148 Last data filed at 06/11/2021 0753 Gross per 24 hour  Intake 1357.87 ml  Output --  Net 1357.87 ml    Filed Weights   06/09/21 1900 06/10/21 0107  Weight: 74.8 kg 82.8 kg    Examination:  General: A/O x4, No acute respiratory distress Eyes: negative scleral hemorrhage, negative anisocoria, negative icterus ENT: Negative Runny nose, negative gingival bleeding, Neck:  Negative scars, masses, torticollis, lymphadenopathy, JVD positive tenderness to palpation  midline C5-C7 Lungs: Clear to auscultation bilaterally without wheezes or crackles Cardiovascular: Regular rate and rhythm without murmur gallop or rub normal S1 and S2 Abdomen: negative abdominal pain, nondistended, positive soft, bowel sounds, no rebound, no ascites, no appreciable mass Extremities: No significant cyanosis, clubbing, or edema bilateral lower extremities Skin: Negative rashes, lesions, ulcers Psychiatric:  Negative depression, negative anxiety, negative fatigue, negative mania  Central nervous system:  Cranial nerves II through XII intact, tongue/uvula midline, all extremities muscle strength 5/5, except bilateral lower extremity muscle strength 4/5, sensation intact throughout, except bilateral lower extremity sensation decreased to touch (patient cannot feel the pressure but does not feel sharp touch), negative dysarthria, negative expressive aphasia, negative receptive aphasia.  .     Data Reviewed: Care during the described time interval was provided by me .  I have reviewed this patient's available data, including medical history, events of note, physical examination, and all test results as part of my evaluation.   CBC: Recent Labs  Lab 06/09/21 1923 06/10/21 0427 06/11/21 0446  WBC 13.5* 16.9* 7.9  NEUTROABS  --  14.8* 4.4  HGB 14.4 12.0 12.2  HCT 42.7 35.2* 36.6  MCV 93.6 93.6 96.1  PLT 292 213 182    Basic Metabolic Panel: Recent Labs  Lab 06/09/21 1923 06/10/21 0427 06/11/21 0446  NA 136 136 139  K 3.7 3.9 3.4*  CL 103 106 105  CO2 23 24 30   GLUCOSE 170* 106* 93  BUN 14 12 8   CREATININE 1.21* 0.82 0.79  CALCIUM 9.2 8.2* 8.8*  MG 2.8* 2.1 1.9  PHOS  --   --  3.3    GFR: Estimated Creatinine Clearance: 104.2 mL/min (by C-G formula based on SCr of 0.79 mg/dL). Liver Function Tests: Recent Labs  Lab 06/09/21 1923 06/10/21 0427 06/11/21 0446  AST 37 20 24  ALT 5 16 18   ALKPHOS 78 59 58  BILITOT 0.5 0.4 0.5  PROT 7.8 6.3* 6.2*   ALBUMIN 4.5 3.6 3.7    Recent Labs  Lab 06/09/21 1923  LIPASE 19    No results for input(s): AMMONIA in the last 168 hours. Coagulation Profile: No results for input(s): INR, PROTIME in the last 168 hours. Cardiac Enzymes: No results for input(s): CKTOTAL, CKMB, CKMBINDEX, TROPONINI in the last 168 hours. BNP (last 3 results) No results for input(s): PROBNP in the last 8760 hours. HbA1C: No results for input(s): HGBA1C in the last 72 hours. CBG: Recent Labs  Lab 06/09/21 1904  GLUCAP 156*    Lipid Profile: No results for  input(s): CHOL, HDL, LDLCALC, TRIG, CHOLHDL, LDLDIRECT in the last 72 hours. Thyroid Function Tests: Recent Labs    06/09/21 1923 06/09/21 1924  TSH  --  2.672  FREET4 0.84  --     Anemia Panel: No results for input(s): VITAMINB12, FOLATE, FERRITIN, TIBC, IRON, RETICCTPCT in the last 72 hours. Urine analysis: No results found for: COLORURINE, APPEARANCEUR, LABSPEC, PHURINE, GLUCOSEU, HGBUR, BILIRUBINUR, KETONESUR, PROTEINUR, UROBILINOGEN, NITRITE, LEUKOCYTESUR Sepsis Labs: (procalcitonin:4,lacticidven:4)  ) Recent Results (from the past 240 hour(s))  Resp Panel by RT-PCR (Flu A&B, Covid) Nasopharyngeal Swab     Status: None   Collection Time: 06/09/21  8:54 PM   Specimen: Nasopharyngeal Swab; Nasopharyngeal(NP) swabs in vial transport medium  Result Value Ref Range Status   SARS Coronavirus 2 by RT PCR NEGATIVE NEGATIVE Final    Comment: (NOTE) SARS-CoV-2 target nucleic acids are NOT DETECTED.  The SARS-CoV-2 RNA is generally detectable in upper respiratory specimens during the acute phase of infection. The lowest concentration of SARS-CoV-2 viral copies this assay can detect is 138 copies/mL. A negative result does not preclude SARS-Cov-2 infection and should not be used as the sole basis for treatment or other patient management decisions. A negative result may occur with  improper specimen collection/handling, submission of  specimen other than nasopharyngeal swab, presence of viral mutation(s) within the areas targeted by this assay, and inadequate number of viral copies(<138 copies/mL). A negative result must be combined with clinical observations, patient history, and epidemiological information. The expected result is Negative.  Fact Sheet for Patients:  BloggerCourse.com  Fact Sheet for Healthcare Providers:  SeriousBroker.it  This test is no t yet approved or cleared by the [place] FDA and  has been authorized for detection and/or diagnosis of SARS-CoV-2 by FDA under an Emergency Use Authorization (EUA). This EUA will remain  in effect (meaning this test can be used) for the duration of the COVID-19 declaration under Section 564(b)(1) of the Act, 21 U.S.C.section 360bbb-3(b)(1), unless the authorization is terminated  or revoked sooner.       Influenza A by PCR NEGATIVE NEGATIVE Final   Influenza B by PCR NEGATIVE NEGATIVE Final    Comment: (NOTE) The Xpert Xpress SARS-CoV-2/FLU/RSV plus assay is intended as an aid in the diagnosis of influenza from Nasopharyngeal swab specimens and should not be used as a sole basis for treatment. Nasal washings and aspirates are unacceptable for Xpert Xpress SARS-CoV-2/FLU/RSV testing.  Fact Sheet for Patients: BloggerCourse.com  Fact Sheet for Healthcare Providers: SeriousBroker.it  This test is not yet approved or cleared by the [place] FDA and has been authorized for detection and/or diagnosis of SARS-CoV-2 by FDA under an Emergency Use Authorization (EUA). This EUA will remain in effect (meaning this test can be used) for the duration of the COVID-19 declaration under Section 564(b)(1) of the Act, 21 U.S.C. section 360bbb-3(b)(1), unless the authorization is terminated or revoked.  Performed at [facility], [address]  [place], [place] [address]   MRSA Next Gen by PCR, Nasal     Status: None   Collection Time: 06/10/21 11:31 AM   Specimen: Nasal Mucosa; Nasal Swab  Result Value Ref Range Status   MRSA by PCR Next Gen NOT DETECTED NOT DETECTED Final    Comment: (NOTE) The GeneXpert MRSA Assay (FDA approved for NASAL specimens only), is one component of a comprehensive MRSA colonization surveillance program. It is not intended to diagnose MRSA infection nor to guide or monitor treatment for MRSA infections. Test performance  is not FDA approved in patients less than 6 years old. Performed at [facility], [address] [place], [place] [address]           Radiology Studies: DG Cervical Spine 2 or 3 views  Result Date: 06/10/2021 CLINICAL DATA:  Neck pain several days EXAM: CERVICAL SPINE - 2-3 VIEW COMPARISON:  MRI cervical spine 06/10/2021 FINDINGS: Suboptimal study. The lateral view is oblique and there is overlying humerus over the cervical spine. Limited detail. AP view and open-mouth view are negative. IMPRESSION: Suboptimal study.  Recommend repeat lateral view. Electronically Signed   By: Marlan Palau M.D.   On: 06/10/2021 16:28   DG Thoracic Spine 2 View  Result Date: 06/10/2021 CLINICAL DATA:  Back pain several days EXAM: THORACIC SPINE 2 VIEWS COMPARISON:  MRI thoracic spine 06/10/2021 FINDINGS: There is no evidence of thoracic spine fracture. Alignment is normal. No other significant bone abnormalities are identified. IMPRESSION: Negative. Electronically Signed   By: Marlan Palau M.D.   On: 06/10/2021 16:30   DG Lumbar Spine 2-3 Views  Result Date: 06/10/2021 CLINICAL DATA:  Back pain several days EXAM: LUMBAR SPINE - 2-3 VIEW COMPARISON:  MRI lumbar spine 06/10/2021 FINDINGS: Mild retrolisthesis L2-3 and L3-4. PLIF L4-5 with solid interbody bone fusion. Disc degeneration and spurring L5-S1 Negative for fracture or mass Distended large and small bowel loops compatible with ileus.  IMPRESSION: PLIF L4-5 with solid fusion Lumbar degenerative changes as above Adynamic ileus. Electronically Signed   By: Marlan Palau M.D.   On: 06/10/2021 16:29   CT HEAD WO CONTRAST ( )  Result Date: 06/09/2021 CLINICAL DATA:  Status post seizure. EXAM: CT HEAD WITHOUT CONTRAST TECHNIQUE: Contiguous axial images were obtained from the base of the skull through the vertex without intravenous contrast. COMPARISON:  None. FINDINGS: Brain: No evidence of acute infarction, hemorrhage, hydrocephalus, extra-axial collection or mass lesion/mass effect. Vascular: No hyperdense vessel or unexpected calcification. Skull: Normal. Negative for fracture or focal lesion. Sinuses/Orbits: There is marked severity right maxillary sinus mucosal thickening. Other: None. IMPRESSION: 1. No acute intracranial abnormality. 2. Marked severity right maxillary sinus mucosal thickening. An underlying polyp versus mucous retention cyst can not be excluded. Electronically Signed   By: Aram Candela M.D.   On: 06/09/2021 20:23   CT Cervical Spine Wo Contrast  Result Date: 06/09/2021 CLINICAL DATA:  Status post seizure. EXAM: CT CERVICAL SPINE WITHOUT CONTRAST TECHNIQUE: Multidetector CT imaging of the cervical spine was performed without intravenous contrast. Multiplanar CT image reconstructions were also generated. COMPARISON:  None. FINDINGS: Alignment: Normal. Skull base and vertebrae: No acute fracture. No primary bone lesion or focal pathologic process. Soft tissues and spinal canal: No prevertebral fluid or swelling. No visible canal hematoma. Disc levels: Marked severity endplate sclerosis and anterior osteophyte formation is seen at the levels of C5-C6 and C6-C7. Mild intervertebral disc space narrowing is seen at the levels of C5-C6 and C6-C7. Normal, bilateral multilevel facet joints are noted. Upper chest: Negative. Other: None. IMPRESSION: 1. Marked severity degenerative changes at the levels of C5-C6 and C6-C7. 2.  No evidence of an acute fracture or subluxation. Electronically Signed   By: Aram Candela M.D.   On: 06/09/2021 20:25   MR CERVICAL SPINE WO CONTRAST  Result Date: 06/10/2021 CLINICAL DATA:  Increased lower extremity weakness. Symptomatic bradycardia. Mid back pain, neuro deficit. EXAM: MRI CERVICAL, THORACIC AND LUMBAR SPINE WITHOUT CONTRAST TECHNIQUE: Multiplanar and multiecho pulse sequences of the cervical spine, to include the craniocervical junction and  cervicothoracic junction, and thoracic and lumbar spine, were obtained without intravenous contrast. COMPARISON:  CT of the cervical spine June 09, 2021. MRI of the lumbar spine July 11, 2014. FINDINGS: MRI CERVICAL SPINE FINDINGS Alignment: Straightening of the cervical curvature. Vertebrae: Diffuse decrease of the T1 signal within the visualized spine, may represent red marrow reconversion in the setting of anemia. No evidence discitis. Endplate degenerative changes C5-6 and C6-7. Cord: Limited evaluation due to motion artifact. No gross cord signal abnormality. Posterior Fossa, vertebral arteries, paraspinal tissues: Negative. Disc levels: C2-3: No spinal canal or neural foraminal stenosis. C3-4: No spinal canal or neural foraminal stenosis. C4-5: Uncovertebral and facet degenerative changes resulting in mild right neural foraminal narrowing. No spinal canal stenosis. C5-6: Posterior disc osteophyte complex resulting in mild spinal canal stenosis. Uncovertebral and facet degenerative changes resulting in moderate right neural foraminal narrowing. C6-7: Posterior disc osteophyte complex resulting in mild spinal canal stenosis. Uncovertebral and facet degenerative changes resulting in moderate right and mild left neural foraminal narrowing. C7-T1: No spinal canal or neural foraminal stenosis. MRI THORACIC SPINE FINDINGS Alignment:  Physiologic. Vertebrae: No fracture, evidence of discitis, or bone lesion. Diffuse decrease of the T1 signal within  the visualized spine, may represent red marrow reconversion in the setting of anemia. Cord:  Normal signal and morphology. Paraspinal and other soft tissues: Negative. Disc levels: A central disc protrusion at T7-8 causing small indentation of the thecal sac without significant spinal canal or neural foraminal stenosis. No significant disc herniation, spinal canal or neural foraminal stenosis in the other thoracic levels. MRI LUMBAR SPINE FINDINGS Segmentation:  Standard. Alignment:  New small retrolisthesis of L3 over L4. Vertebrae: No fracture, evidence of discitis, or bone lesion. Diffuse decrease of the T1 signal within the visualized spine, may represent red marrow reconversion in the setting of anemia. Interval postsurgical changes from interbody fusion, laminectomy and posterior fixation at L4-5. Conus medullaris and cauda equina: Conus extends to the L1 level. Conus and cauda equina appear normal. Paraspinal and other soft tissues: Negative. Disc levels: T12-L1: No spinal canal or neural foraminal stenosis. L1-2: No spinal canal or neural foraminal stenosis. L2-3: Shallow disc bulge and mild facet degenerative changes. No significant spinal canal or neural foraminal stenosis. L3-4: Disc bulge, hypertrophic facet degenerative changes mild ligamentum flavum resulting in mild narrowing of the bilateral subarticular zones. No significant neural foraminal narrowing. L4-5: No residual spinal canal or neural foraminal stenosis. L5-S1: Loss disc height, disc bulge superimposed right central protrusion, mild-to-moderate facet degenerative changes resulting in mild narrowing of the bilateral subarticular zones and mild bilateral neural foraminal narrowing. No significant spinal canal stenosis. IMPRESSION: 1. 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 Moderate right neural foraminal narrowing at C5-6 and C6-7. 2. Mild degenerative changes of the thoracic spine without  significant spinal canal or neural foraminal stenosis at any level. 3. Postsurgical changes at L4-5 without residual spinal canal or neural foraminal stenosis. 4. Degenerative changes at L3-4 with small retrolisthesis and mild narrowing of the bilateral subarticular zones. 5. Degenerative changes at L5-S1 with mild narrowing of the bilateral subarticular zones and mild bilateral neural foraminal narrowing. 6. Mild diffuse decrease of the T1 signal within the visualized spine, may represent red marrow reconversion in the setting of anemia. Electronically Signed   By: Baldemar Lenis M.D.   On: 06/10/2021 14:45   MR THORACIC SPINE WO CONTRAST  Result Date: 06/10/2021 CLINICAL DATA:  Increased lower extremity  weakness. Symptomatic bradycardia. Mid back pain, neuro deficit. EXAM: MRI CERVICAL, THORACIC AND LUMBAR SPINE WITHOUT CONTRAST TECHNIQUE: Multiplanar and multiecho pulse sequences of the cervical spine, to include the craniocervical junction and cervicothoracic junction, and thoracic and lumbar spine, were obtained without intravenous contrast. COMPARISON:  CT of the cervical spine June 09, 2021. MRI of the lumbar spine July 11, 2014. FINDINGS: MRI CERVICAL SPINE FINDINGS Alignment: Straightening of the cervical curvature. Vertebrae: Diffuse decrease of the T1 signal within the visualized spine, may represent red marrow reconversion in the setting of anemia. No evidence discitis. Endplate degenerative changes C5-6 and C6-7. Cord: Limited evaluation due to motion artifact. No gross cord signal abnormality. Posterior Fossa, vertebral arteries, paraspinal tissues: Negative. Disc levels: C2-3: No spinal canal or neural foraminal stenosis. C3-4: No spinal canal or neural foraminal stenosis. C4-5: Uncovertebral and facet degenerative changes resulting in mild right neural foraminal narrowing. No spinal canal stenosis. C5-6: Posterior disc osteophyte complex resulting in mild spinal canal stenosis.  Uncovertebral and facet degenerative changes resulting in moderate right neural foraminal narrowing. C6-7: Posterior disc osteophyte complex resulting in mild spinal canal stenosis. Uncovertebral and facet degenerative changes resulting in moderate right and mild left neural foraminal narrowing. C7-T1: No spinal canal or neural foraminal stenosis. MRI THORACIC SPINE FINDINGS Alignment:  Physiologic. Vertebrae: No fracture, evidence of discitis, or bone lesion. Diffuse decrease of the T1 signal within the visualized spine, may represent red marrow reconversion in the setting of anemia. Cord:  Normal signal and morphology. Paraspinal and other soft tissues: Negative. Disc levels: A central disc protrusion at T7-8 causing small indentation of the thecal sac without significant spinal canal or neural foraminal stenosis. No significant disc herniation, spinal canal or neural foraminal stenosis in the other thoracic levels. MRI LUMBAR SPINE FINDINGS Segmentation:  Standard. Alignment:  New small retrolisthesis of L3 over L4. Vertebrae: No fracture, evidence of discitis, or bone lesion. Diffuse decrease of the T1 signal within the visualized spine, may represent red marrow reconversion in the setting of anemia. Interval postsurgical changes from interbody fusion, laminectomy and posterior fixation at L4-5. Conus medullaris and cauda equina: Conus extends to the L1 level. Conus and cauda equina appear normal. Paraspinal and other soft tissues: Negative. Disc levels: T12-L1: No spinal canal or neural foraminal stenosis. L1-2: No spinal canal or neural foraminal stenosis. L2-3: Shallow disc bulge and mild facet degenerative changes. No significant spinal canal or neural foraminal stenosis. L3-4: Disc bulge, hypertrophic facet degenerative changes mild ligamentum flavum resulting in mild narrowing of the bilateral subarticular zones. No significant neural foraminal narrowing. L4-5: No residual spinal canal or neural foraminal  stenosis. L5-S1: Loss disc height, disc bulge superimposed right central protrusion, mild-to-moderate facet degenerative changes resulting in mild narrowing of the bilateral subarticular zones and mild bilateral neural foraminal narrowing. No significant spinal canal stenosis. IMPRESSION: 1. 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 Moderate right neural foraminal narrowing at C5-6 and C6-7. 2. Mild degenerative changes of the thoracic spine without significant spinal canal or neural foraminal stenosis at any level. 3. Postsurgical changes at L4-5 without residual spinal canal or neural foraminal stenosis. 4. Degenerative changes at L3-4 with small retrolisthesis and mild narrowing of the bilateral subarticular zones. 5. Degenerative changes at L5-S1 with mild narrowing of the bilateral subarticular zones and mild bilateral neural foraminal narrowing. 6. Mild diffuse decrease of the T1 signal within the visualized spine, may represent red marrow reconversion in the setting of anemia.  Electronically Signed   By: Baldemar Lenis M.D.   On: 06/10/2021 14:45   MR LUMBAR SPINE WO CONTRAST  Result Date: 06/10/2021 CLINICAL DATA:  Increased lower extremity weakness. Symptomatic bradycardia. Mid back pain, neuro deficit. EXAM: MRI CERVICAL, THORACIC AND LUMBAR SPINE WITHOUT CONTRAST TECHNIQUE: Multiplanar and multiecho pulse sequences of the cervical spine, to include the craniocervical junction and cervicothoracic junction, and thoracic and lumbar spine, were obtained without intravenous contrast. COMPARISON:  CT of the cervical spine June 09, 2021. MRI of the lumbar spine July 11, 2014. FINDINGS: MRI CERVICAL SPINE FINDINGS Alignment: Straightening of the cervical curvature. Vertebrae: Diffuse decrease of the T1 signal within the visualized spine, may represent red marrow reconversion in the setting of anemia. No evidence discitis. Endplate  degenerative changes C5-6 and C6-7. Cord: Limited evaluation due to motion artifact. No gross cord signal abnormality. Posterior Fossa, vertebral arteries, paraspinal tissues: Negative. Disc levels: C2-3: No spinal canal or neural foraminal stenosis. C3-4: No spinal canal or neural foraminal stenosis. C4-5: Uncovertebral and facet degenerative changes resulting in mild right neural foraminal narrowing. No spinal canal stenosis. C5-6: Posterior disc osteophyte complex resulting in mild spinal canal stenosis. Uncovertebral and facet degenerative changes resulting in moderate right neural foraminal narrowing. C6-7: Posterior disc osteophyte complex resulting in mild spinal canal stenosis. Uncovertebral and facet degenerative changes resulting in moderate right and mild left neural foraminal narrowing. C7-T1: No spinal canal or neural foraminal stenosis. MRI THORACIC SPINE FINDINGS Alignment:  Physiologic. Vertebrae: No fracture, evidence of discitis, or bone lesion. Diffuse decrease of the T1 signal within the visualized spine, may represent red marrow reconversion in the setting of anemia. Cord:  Normal signal and morphology. Paraspinal and other soft tissues: Negative. Disc levels: A central disc protrusion at T7-8 causing small indentation of the thecal sac without significant spinal canal or neural foraminal stenosis. No significant disc herniation, spinal canal or neural foraminal stenosis in the other thoracic levels. MRI LUMBAR SPINE FINDINGS Segmentation:  Standard. Alignment:  New small retrolisthesis of L3 over L4. Vertebrae: No fracture, evidence of discitis, or bone lesion. Diffuse decrease of the T1 signal within the visualized spine, may represent red marrow reconversion in the setting of anemia. Interval postsurgical changes from interbody fusion, laminectomy and posterior fixation at L4-5. Conus medullaris and cauda equina: Conus extends to the L1 level. Conus and cauda equina appear normal. Paraspinal  and other soft tissues: Negative. Disc levels: T12-L1: No spinal canal or neural foraminal stenosis. L1-2: No spinal canal or neural foraminal stenosis. L2-3: Shallow disc bulge and mild facet degenerative changes. No significant spinal canal or neural foraminal stenosis. L3-4: Disc bulge, hypertrophic facet degenerative changes mild ligamentum flavum resulting in mild narrowing of the bilateral subarticular zones. No significant neural foraminal narrowing. L4-5: No residual spinal canal or neural foraminal stenosis. L5-S1: Loss disc height, disc bulge superimposed right central protrusion, mild-to-moderate facet degenerative changes resulting in mild narrowing of the bilateral subarticular zones and mild bilateral neural foraminal narrowing. No significant spinal canal stenosis. IMPRESSION: 1. 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 Moderate right neural foraminal narrowing at C5-6 and C6-7. 2. Mild degenerative changes of the thoracic spine without significant spinal canal or neural foraminal stenosis at any level. 3. Postsurgical changes at L4-5 without residual spinal canal or neural foraminal stenosis. 4. Degenerative changes at L3-4 with small retrolisthesis and mild narrowing of the bilateral subarticular zones. 5. Degenerative changes at L5-S1 with mild narrowing  of the bilateral subarticular zones and mild bilateral neural foraminal narrowing. 6. Mild diffuse decrease of the T1 signal within the visualized spine, may represent red marrow reconversion in the setting of anemia. Electronically Signed   By: Baldemar LenisKatyucia  de Macedo Rodrigues M.D.   On: 06/10/2021 14:45   DG Chest Portable 1 View  Result Date: 06/09/2021 CLINICAL DATA:  Vomiting and seizures. EXAM: PORTABLE CHEST 1 VIEW COMPARISON:  November 23, 2013 FINDINGS: The heart size and mediastinal contours are within normal limits. Both lungs are clear. The visualized skeletal structures are  unremarkable. IMPRESSION: No active cardiopulmonary disease. Electronically Signed   By: Aram Candelahaddeus  Houston M.D.   On: 06/09/2021 20:36   ECHOCARDIOGRAM COMPLETE  Result Date: 06/10/2021    ECHOCARDIOGRAM REPORT   Patient Name:   Steward RosSHANA S Birchard Date of Exam: 06/10/2021 Medical Rec #:  [id number]        Height:       69.0 in Accession #:    [id number]       Weight:       182.5 lb Date of Birth:  December 31, 1975        BSA:          1.987 m Patient Age:    45 years         BP:           117/48 mmHg Patient Gender: F                HR:           51 bpm. [facility] Procedure: 2D Echo, Cardiac Doppler and Color Doppler Indications:    Chest Pain  History:        Patient has no prior history of Echocardiogram examinations.                 Arrythmias:Bradycardia. Witnessed seizure-like activity.  Sonographer:    Mikki Harbororothy Buchanan Referring Phys: [phone] ERIC CHEN IMPRESSIONS  1. Left ventricular ejection fraction, by estimation, is 60 to 65%. The left ventricle has normal function. The left ventricle has no regional wall motion abnormalities. Left ventricular diastolic parameters were normal.  2. Right ventricular systolic function is normal. The right ventricular size is normal. Tricuspid regurgitation signal is inadequate for assessing PA pressure.  3. The mitral valve is grossly normal. Trivial mitral valve regurgitation.  4. The aortic valve is tricuspid. Aortic valve regurgitation is not visualized. Aortic valve mean gradient measures 5.0 mmHg.  5. The inferior vena cava is dilated in size with >50% respiratory variability, suggesting right atrial pressure of 8 mmHg. Comparison(s): No prior Echocardiogram. FINDINGS  Left Ventricle: Left ventricular ejection fraction, by estimation, is 60 to 65%. The left ventricle has normal function. The left ventricle has no regional wall motion abnormalities. The left ventricular internal cavity size was normal in size. There is  no left ventricular hypertrophy. Left  ventricular diastolic parameters were normal. Right Ventricle: The right ventricular size is normal. No increase in right ventricular wall thickness. Right ventricular systolic function is normal. Tricuspid regurgitation signal is inadequate for assessing PA pressure. Left Atrium: Left atrial size was normal in size. Right Atrium: Right atrial size was normal in size. Pericardium: There is no evidence of pericardial effusion. Mitral Valve: The mitral valve is grossly normal. Trivial mitral valve regurgitation. MV peak gradient, 4.4 mmHg. The mean mitral valve gradient is 1.0 mmHg. Tricuspid Valve: The tricuspid valve is grossly normal. Tricuspid valve regurgitation is trivial. Aortic Valve: The  aortic valve is tricuspid. There is mild aortic valve annular calcification. Aortic valve regurgitation is not visualized. Aortic valve mean gradient measures 5.0 mmHg. Aortic valve peak gradient measures 8.2 mmHg. Aortic valve area, by  VTI measures 2.31 cm. Pulmonic Valve: The pulmonic valve was grossly normal. Pulmonic valve regurgitation is trivial. Aorta: The aortic root is normal in size and structure. Venous: The inferior vena cava is dilated in size with greater than 50% respiratory variability, suggesting right atrial pressure of 8 mmHg. IAS/Shunts: No atrial level shunt detected by color flow Doppler.  LEFT VENTRICLE PLAX 2D LVIDd:         5.36 cm  Diastology LVIDs:         3.27 cm  LV e' medial:    10.90 cm/s LV PW:         0.98 cm  LV E/e' medial:  9.5 LV IVS:        0.91 cm  LV e' lateral:   15.50 cm/s LVOT diam:     1.90 cm  LV E/e' lateral: 6.7 LV SV:         85 LV SV Index:   43 LVOT Area:     2.84 cm  RIGHT VENTRICLE RV Basal diam:  3.05 cm RV Mid diam:    3.21 cm RV S prime:     16.50 cm/s TAPSE (M-mode): 3.5 cm LEFT ATRIUM             Index       RIGHT ATRIUM           Index LA diam:        3.80 cm 1.91 cm/m  RA Area:     12.60 cm LA Vol (A2C):   61.4 ml 30.90 ml/m RA Volume:   28.40 ml  14.29 ml/m LA  Vol (A4C):   44.9 ml 22.59 ml/m LA Biplane Vol: 51.0 ml 25.66 ml/m  AORTIC VALVE AV Area (Vmax):    2.52 cm AV Area (Vmean):   2.26 cm AV Area (VTI):     2.31 cm AV Vmax:           143.00 cm/s AV Vmean:          101.000 cm/s AV VTI:            0.368 m AV Peak Grad:      8.2 mmHg AV Mean Grad:      5.0 mmHg LVOT Vmax:         127.00 cm/s LVOT Vmean:        80.600 cm/s LVOT VTI:          0.300 m LVOT/AV VTI ratio: 0.82 MITRAL VALVE MV Area (PHT): 3.91 cm     SHUNTS MV Area VTI:   2.39 cm     Systemic VTI:  0.30 m MV Peak grad:  4.4 mmHg     Systemic Diam: 1.90 cm MV Mean grad:  1.0 mmHg MV Vmax:       1.05 m/s MV Vmean:      45.0 cm/s MV Decel Time: 194 msec MV E velocity: 104.00 cm/s MV A velocity: 57.20 cm/s MV E/A ratio:  1.82 Nona Dell MD Electronically signed by Nona Dell MD Signature Date/Time: 06/10/2021/12:14:18 PM    Final         Scheduled Meds:  Chlorhexidine Gluconate Cloth  6 each Topical Q0600   sodium chloride flush  3 mL Intravenous Q12H   Continuous Infusions:  lactated ringers 50 mL/hr  at 06/11/21 0753     LOS: 2 days   The patient is critically ill with multiple organ systems failure and requires high complexity decision making for assessment and support, frequent evaluation and titration of therapies, application of advanced monitoring technologies and extensive interpretation of multiple databases. Critical Care Time devoted to patient care services described in this note  Time spent: 40 minutes     Graciella Arment, Roselind Messier, MD [facility]   If 7PM-7AM, please contact night-coverage 06/11/2021, 11:48 AM

## 2021-06-11 NOTE — Progress Notes (Signed)
Progress Note  Patient Name: Mckenzie Baker Date of Encounter: 06/11/2021  Primary Cardiologist: New to [facility]  Subjective   No shortness of breath, no dizziness at rest, no syncope.  Inpatient Medications    Scheduled Meds:  Chlorhexidine Gluconate Cloth  6 each Topical Q0600   sodium chloride flush  3 mL Intravenous Q12H   Continuous Infusions:  lactated ringers 50 mL/hr at 06/11/21 0753   PRN Meds: acetaminophen **OR** acetaminophen, ALPRAZolam, atropine, LORazepam, ondansetron **OR** ondansetron (ZOFRAN) IV, oxyCODONE, senna-docusate   Vital Signs    Vitals:   06/11/21 0000 06/11/21 0400 06/11/21 0741 06/11/21 0745  BP:      Pulse:   (!) 53 65  Resp:   15 15  Temp: 98.3 F (36.8 C) 98 F (36.7 C) 97.7 F (36.5 C)   TempSrc: Oral Oral Oral   SpO2:   96% 100%  Weight:      Height:        Intake/Output Summary (Last 24 hours) at 06/11/2021 0825 Last data filed at 06/11/2021 0753 Gross per 24 hour  Intake 1360.87 ml  Output --  Net 1360.87 ml   Filed Weights   06/09/21 1900 06/10/21 0107  Weight: 74.8 kg 82.8 kg    Telemetry    Sinus rhythm and sinus bradycardia.  Personally reviewed.  ECG    No ECG reviewed.  Physical Exam   GEN: No acute distress.   Neck: No JVD. Cardiac: RRR, no murmur, rub, or gallop.  Respiratory: Nonlabored. Clear to auscultation bilaterally. GI: Soft, nontender, bowel sounds present. MS: No edema; No deformity. Neuro:  Nonfocal. Psych: Alert and oriented x 3. Normal affect.  Labs    Chemistry Recent Labs  Lab 06/09/21 1923 06/10/21 0427 06/11/21 0446  NA 136 136 139  K 3.7 3.9 3.4*  CL 103 106 105  CO2 GLUCOSE 170* 106* 93  BUN CREATININE 1.21* 0.82 0.79  CALCIUM 9.2 8.2* 8.8*  PROT 7.8 6.3* 6.2*  ALBUMIN 4.5 3.6 3.7  AST 37 20 24  ALT 5 16 18   ALKPHOS 78 59 58  BILITOT 0.5 0.4 0.5  GFRNONAA 57* >60 >60  ANIONGAP 10 6 4*     Hematology Recent Labs  Lab 06/09/21 1923  06/10/21 0427 06/11/21 0446  WBC 13.5* 16.9* 7.9  RBC 4.56 3.76* 3.81*  HGB 14.4 12.0 12.2  HCT 42.7 35.2* 36.6  MCV 93.6 93.6 96.1  MCH 31.6 31.9 32.0  MCHC 33.7 34.1 33.3  RDW 12.2 12.2 12.3  PLT 292 213 182    Radiology    DG Cervical Spine 2 or 3 views  Result Date: 06/10/2021 CLINICAL DATA:  Neck pain several days EXAM: CERVICAL SPINE - 2-3 VIEW COMPARISON:  MRI cervical spine 06/10/2021 FINDINGS: Suboptimal study. The lateral view is oblique and there is overlying humerus over the cervical spine. Limited detail. AP view and open-mouth view are negative. IMPRESSION: Suboptimal study.  Recommend repeat lateral view. Electronically Signed   By: Marlan Palau M.D.   On: 06/10/2021 16:28   DG Thoracic Spine 2 View  Result Date: 06/10/2021 CLINICAL DATA:  Back pain several days EXAM: THORACIC SPINE 2 VIEWS COMPARISON:  MRI thoracic spine 06/10/2021 FINDINGS: There is no evidence of thoracic spine fracture. Alignment is normal. No other significant bone abnormalities are identified. IMPRESSION: Negative. Electronically Signed   By: Marlan Palau M.D.   On: 06/10/2021 16:30   DG Lumbar Spine 2-3 Views  Result Date: 06/10/2021 CLINICAL DATA:  Back pain several days EXAM: LUMBAR SPINE - 2-3 VIEW COMPARISON:  MRI lumbar spine 06/10/2021 FINDINGS: Mild retrolisthesis L2-3 and L3-4. PLIF L4-5 with solid interbody bone fusion. Disc degeneration and spurring L5-S1 Negative for fracture or mass Distended large and small bowel loops compatible with ileus. IMPRESSION: PLIF L4-5 with solid fusion Lumbar degenerative changes as above Adynamic ileus. Electronically Signed   By: Marlan Palau M.D.   On: 06/10/2021 16:29   CT HEAD WO CONTRAST ( )  Result Date: 06/09/2021 CLINICAL DATA:  Status post seizure. EXAM: CT HEAD WITHOUT CONTRAST TECHNIQUE: Contiguous axial images were obtained from the base of the skull through the vertex without intravenous contrast. COMPARISON:  None. FINDINGS: Brain: No  evidence of acute infarction, hemorrhage, hydrocephalus, extra-axial collection or mass lesion/mass effect. Vascular: No hyperdense vessel or unexpected calcification. Skull: Normal. Negative for fracture or focal lesion. Sinuses/Orbits: There is marked severity right maxillary sinus mucosal thickening. Other: None. IMPRESSION: 1. No acute intracranial abnormality. 2. Marked severity right maxillary sinus mucosal thickening. An underlying polyp versus mucous retention cyst can not be excluded. Electronically Signed   By: Aram Candela M.D.   On: 06/09/2021 20:23   CT Cervical Spine Wo Contrast  Result Date: 06/09/2021 CLINICAL DATA:  Status post seizure. EXAM: CT CERVICAL SPINE WITHOUT CONTRAST TECHNIQUE: Multidetector CT imaging of the cervical spine was performed without intravenous contrast. Multiplanar CT image reconstructions were also generated. COMPARISON:  None. FINDINGS: Alignment: Normal. Skull base and vertebrae: No acute fracture. No primary bone lesion or focal pathologic process. Soft tissues and spinal canal: No prevertebral fluid or swelling. No visible canal hematoma. Disc levels: Marked severity endplate sclerosis and anterior osteophyte formation is seen at the levels of C5-C6 and C6-C7. Mild intervertebral disc space narrowing is seen at the levels of C5-C6 and C6-C7. Normal, bilateral multilevel facet joints are noted. Upper chest: Negative. Other: None. IMPRESSION: 1. Marked severity degenerative changes at the levels of C5-C6 and C6-C7. 2. No evidence of an acute fracture or subluxation. Electronically Signed   By: Aram Candela M.D.   On: 06/09/2021 20:25   MR CERVICAL SPINE WO CONTRAST  Result Date: 06/10/2021 CLINICAL DATA:  Increased lower extremity weakness. Symptomatic bradycardia. Mid back pain, neuro deficit. EXAM: MRI CERVICAL, THORACIC AND LUMBAR SPINE WITHOUT CONTRAST TECHNIQUE: Multiplanar and multiecho pulse sequences of the cervical spine, to include the  craniocervical junction and cervicothoracic junction, and thoracic and lumbar spine, were obtained without intravenous contrast. COMPARISON:  CT of the cervical spine June 09, 2021. MRI of the lumbar spine July 11, 2014. FINDINGS: MRI CERVICAL SPINE FINDINGS Alignment: Straightening of the cervical curvature. Vertebrae: Diffuse decrease of the T1 signal within the visualized spine, may represent red marrow reconversion in the setting of anemia. No evidence discitis. Endplate degenerative changes C5-6 and C6-7. Cord: Limited evaluation due to motion artifact. No gross cord signal abnormality. Posterior Fossa, vertebral arteries, paraspinal tissues: Negative. Disc levels: C2-3: No spinal canal or neural foraminal stenosis. C3-4: No spinal canal or neural foraminal stenosis. C4-5: Uncovertebral and facet degenerative changes resulting in mild right neural foraminal narrowing. No spinal canal stenosis. C5-6: Posterior disc osteophyte complex resulting in mild spinal canal stenosis. Uncovertebral and facet degenerative changes resulting in moderate right neural foraminal narrowing. C6-7: Posterior disc osteophyte complex resulting in mild spinal canal stenosis. Uncovertebral and facet degenerative changes resulting in moderate right and mild left neural foraminal narrowing. C7-T1: No spinal canal or neural foraminal stenosis. MRI THORACIC  SPINE FINDINGS Alignment:  Physiologic. Vertebrae: No fracture, evidence of discitis, or bone lesion. Diffuse decrease of the T1 signal within the visualized spine, may represent red marrow reconversion in the setting of anemia. Cord:  Normal signal and morphology. Paraspinal and other soft tissues: Negative. Disc levels: A central disc protrusion at T7-8 causing small indentation of the thecal sac without significant spinal canal or neural foraminal stenosis. No significant disc herniation, spinal canal or neural foraminal stenosis in the other thoracic levels. MRI LUMBAR SPINE  FINDINGS Segmentation:  Standard. Alignment:  New small retrolisthesis of L3 over L4. Vertebrae: No fracture, evidence of discitis, or bone lesion. Diffuse decrease of the T1 signal within the visualized spine, may represent red marrow reconversion in the setting of anemia. Interval postsurgical changes from interbody fusion, laminectomy and posterior fixation at L4-5. Conus medullaris and cauda equina: Conus extends to the L1 level. Conus and cauda equina appear normal. Paraspinal and other soft tissues: Negative. Disc levels: T12-L1: No spinal canal or neural foraminal stenosis. L1-2: No spinal canal or neural foraminal stenosis. L2-3: Shallow disc bulge and mild facet degenerative changes. No significant spinal canal or neural foraminal stenosis. L3-4: Disc bulge, hypertrophic facet degenerative changes mild ligamentum flavum resulting in mild narrowing of the bilateral subarticular zones. No significant neural foraminal narrowing. L4-5: No residual spinal canal or neural foraminal stenosis. L5-S1: Loss disc height, disc bulge superimposed right central protrusion, mild-to-moderate facet degenerative changes resulting in mild narrowing of the bilateral subarticular zones and mild bilateral neural foraminal narrowing. No significant spinal canal stenosis. IMPRESSION: 1. 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 Moderate right neural foraminal narrowing at C5-6 and C6-7. 2. Mild degenerative changes of the thoracic spine without significant spinal canal or neural foraminal stenosis at any level. 3. Postsurgical changes at L4-5 without residual spinal canal or neural foraminal stenosis. 4. Degenerative changes at L3-4 with small retrolisthesis and mild narrowing of the bilateral subarticular zones. 5. Degenerative changes at L5-S1 with mild narrowing of the bilateral subarticular zones and mild bilateral neural foraminal narrowing. 6. Mild diffuse decrease of the T1  signal within the visualized spine, may represent red marrow reconversion in the setting of anemia. Electronically Signed   By: Baldemar Lenis M.D.   On: 06/10/2021 14:45   MR THORACIC SPINE WO CONTRAST  Result Date: 06/10/2021 CLINICAL DATA:  Increased lower extremity weakness. Symptomatic bradycardia. Mid back pain, neuro deficit. EXAM: MRI CERVICAL, THORACIC AND LUMBAR SPINE WITHOUT CONTRAST TECHNIQUE: Multiplanar and multiecho pulse sequences of the cervical spine, to include the craniocervical junction and cervicothoracic junction, and thoracic and lumbar spine, were obtained without intravenous contrast. COMPARISON:  CT of the cervical spine June 09, 2021. MRI of the lumbar spine July 11, 2014. FINDINGS: MRI CERVICAL SPINE FINDINGS Alignment: Straightening of the cervical curvature. Vertebrae: Diffuse decrease of the T1 signal within the visualized spine, may represent red marrow reconversion in the setting of anemia. No evidence discitis. Endplate degenerative changes C5-6 and C6-7. Cord: Limited evaluation due to motion artifact. No gross cord signal abnormality. Posterior Fossa, vertebral arteries, paraspinal tissues: Negative. Disc levels: C2-3: No spinal canal or neural foraminal stenosis. C3-4: No spinal canal or neural foraminal stenosis. C4-5: Uncovertebral and facet degenerative changes resulting in mild right neural foraminal narrowing. No spinal canal stenosis. C5-6: Posterior disc osteophyte complex resulting in mild spinal canal stenosis. Uncovertebral and facet degenerative changes resulting in moderate right neural foraminal narrowing. C6-7: Posterior disc  osteophyte complex resulting in mild spinal canal stenosis. Uncovertebral and facet degenerative changes resulting in moderate right and mild left neural foraminal narrowing. C7-T1: No spinal canal or neural foraminal stenosis. MRI THORACIC SPINE FINDINGS Alignment:  Physiologic. Vertebrae: No fracture, evidence of  discitis, or bone lesion. Diffuse decrease of the T1 signal within the visualized spine, may represent red marrow reconversion in the setting of anemia. Cord:  Normal signal and morphology. Paraspinal and other soft tissues: Negative. Disc levels: A central disc protrusion at T7-8 causing small indentation of the thecal sac without significant spinal canal or neural foraminal stenosis. No significant disc herniation, spinal canal or neural foraminal stenosis in the other thoracic levels. MRI LUMBAR SPINE FINDINGS Segmentation:  Standard. Alignment:  New small retrolisthesis of L3 over L4. Vertebrae: No fracture, evidence of discitis, or bone lesion. Diffuse decrease of the T1 signal within the visualized spine, may represent red marrow reconversion in the setting of anemia. Interval postsurgical changes from interbody fusion, laminectomy and posterior fixation at L4-5. Conus medullaris and cauda equina: Conus extends to the L1 level. Conus and cauda equina appear normal. Paraspinal and other soft tissues: Negative. Disc levels: T12-L1: No spinal canal or neural foraminal stenosis. L1-2: No spinal canal or neural foraminal stenosis. L2-3: Shallow disc bulge and mild facet degenerative changes. No significant spinal canal or neural foraminal stenosis. L3-4: Disc bulge, hypertrophic facet degenerative changes mild ligamentum flavum resulting in mild narrowing of the bilateral subarticular zones. No significant neural foraminal narrowing. L4-5: No residual spinal canal or neural foraminal stenosis. L5-S1: Loss disc height, disc bulge superimposed right central protrusion, mild-to-moderate facet degenerative changes resulting in mild narrowing of the bilateral subarticular zones and mild bilateral neural foraminal narrowing. No significant spinal canal stenosis. IMPRESSION: 1. 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 Moderate right neural foraminal narrowing at C5-6 and  C6-7. 2. Mild degenerative changes of the thoracic spine without significant spinal canal or neural foraminal stenosis at any level. 3. Postsurgical changes at L4-5 without residual spinal canal or neural foraminal stenosis. 4. Degenerative changes at L3-4 with small retrolisthesis and mild narrowing of the bilateral subarticular zones. 5. Degenerative changes at L5-S1 with mild narrowing of the bilateral subarticular zones and mild bilateral neural foraminal narrowing. 6. Mild diffuse decrease of the T1 signal within the visualized spine, may represent red marrow reconversion in the setting of anemia. Electronically Signed   By: Baldemar Lenis M.D.   On: 06/10/2021 14:45   MR LUMBAR SPINE WO CONTRAST  Result Date: 06/10/2021 CLINICAL DATA:  Increased lower extremity weakness. Symptomatic bradycardia. Mid back pain, neuro deficit. EXAM: MRI CERVICAL, THORACIC AND LUMBAR SPINE WITHOUT CONTRAST TECHNIQUE: Multiplanar and multiecho pulse sequences of the cervical spine, to include the craniocervical junction and cervicothoracic junction, and thoracic and lumbar spine, were obtained without intravenous contrast. COMPARISON:  CT of the cervical spine June 09, 2021. MRI of the lumbar spine July 11, 2014. FINDINGS: MRI CERVICAL SPINE FINDINGS Alignment: Straightening of the cervical curvature. Vertebrae: Diffuse decrease of the T1 signal within the visualized spine, may represent red marrow reconversion in the setting of anemia. No evidence discitis. Endplate degenerative changes C5-6 and C6-7. Cord: Limited evaluation due to motion artifact. No gross cord signal abnormality. Posterior Fossa, vertebral arteries, paraspinal tissues: Negative. Disc levels: C2-3: No spinal canal or neural foraminal stenosis. C3-4: No spinal canal or neural foraminal stenosis. C4-5: Uncovertebral and facet degenerative changes resulting in mild right  neural foraminal narrowing. No spinal canal stenosis. C5-6: Posterior  disc osteophyte complex resulting in mild spinal canal stenosis. Uncovertebral and facet degenerative changes resulting in moderate right neural foraminal narrowing. C6-7: Posterior disc osteophyte complex resulting in mild spinal canal stenosis. Uncovertebral and facet degenerative changes resulting in moderate right and mild left neural foraminal narrowing. C7-T1: No spinal canal or neural foraminal stenosis. MRI THORACIC SPINE FINDINGS Alignment:  Physiologic. Vertebrae: No fracture, evidence of discitis, or bone lesion. Diffuse decrease of the T1 signal within the visualized spine, may represent red marrow reconversion in the setting of anemia. Cord:  Normal signal and morphology. Paraspinal and other soft tissues: Negative. Disc levels: A central disc protrusion at T7-8 causing small indentation of the thecal sac without significant spinal canal or neural foraminal stenosis. No significant disc herniation, spinal canal or neural foraminal stenosis in the other thoracic levels. MRI LUMBAR SPINE FINDINGS Segmentation:  Standard. Alignment:  New small retrolisthesis of L3 over L4. Vertebrae: No fracture, evidence of discitis, or bone lesion. Diffuse decrease of the T1 signal within the visualized spine, may represent red marrow reconversion in the setting of anemia. Interval postsurgical changes from interbody fusion, laminectomy and posterior fixation at L4-5. Conus medullaris and cauda equina: Conus extends to the L1 level. Conus and cauda equina appear normal. Paraspinal and other soft tissues: Negative. Disc levels: T12-L1: No spinal canal or neural foraminal stenosis. L1-2: No spinal canal or neural foraminal stenosis. L2-3: Shallow disc bulge and mild facet degenerative changes. No significant spinal canal or neural foraminal stenosis. L3-4: Disc bulge, hypertrophic facet degenerative changes mild ligamentum flavum resulting in mild narrowing of the bilateral subarticular zones. No significant neural  foraminal narrowing. L4-5: No residual spinal canal or neural foraminal stenosis. L5-S1: Loss disc height, disc bulge superimposed right central protrusion, mild-to-moderate facet degenerative changes resulting in mild narrowing of the bilateral subarticular zones and mild bilateral neural foraminal narrowing. No significant spinal canal stenosis. IMPRESSION: 1. 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 Moderate right neural foraminal narrowing at C5-6 and C6-7. 2. Mild degenerative changes of the thoracic spine without significant spinal canal or neural foraminal stenosis at any level. 3. Postsurgical changes at L4-5 without residual spinal canal or neural foraminal stenosis. 4. Degenerative changes at L3-4 with small retrolisthesis and mild narrowing of the bilateral subarticular zones. 5. Degenerative changes at L5-S1 with mild narrowing of the bilateral subarticular zones and mild bilateral neural foraminal narrowing. 6. Mild diffuse decrease of the T1 signal within the visualized spine, may represent red marrow reconversion in the setting of anemia. Electronically Signed   By: Baldemar Lenis M.D.   On: 06/10/2021 14:45   DG Chest Portable 1 View  Result Date: 06/09/2021 CLINICAL DATA:  Vomiting and seizures. EXAM: PORTABLE CHEST 1 VIEW COMPARISON:  November 23, 2013 FINDINGS: The heart size and mediastinal contours are within normal limits. Both lungs are clear. The visualized skeletal structures are unremarkable. IMPRESSION: No active cardiopulmonary disease. Electronically Signed   By: Aram Candela M.D.   On: 06/09/2021 20:36   ECHOCARDIOGRAM COMPLETE  Result Date: 06/10/2021    ECHOCARDIOGRAM REPORT   Patient Name:   Mckenzie Baker Date of Exam: 06/10/2021 Medical Rec #:  [id number]        Height:       69.0 in Accession #:    [id number]       Weight:       182.5 lb  Date of Birth:  1976/09/01        BSA:          1.987 m Patient  Age:    45 years         BP:           117/48 mmHg Patient Gender: F                HR:           51 bpm. [facility] Procedure: 2D Echo, Cardiac Doppler and Color Doppler Indications:    Chest Pain  History:        Patient has no prior history of Echocardiogram examinations.                 Arrythmias:Bradycardia. Witnessed seizure-like activity.  Sonographer:    Mikki Harbor Referring Phys: [phone] ERIC CHEN IMPRESSIONS  1. Left ventricular ejection fraction, by estimation, is 60 to 65%. The left ventricle has normal function. The left ventricle has no regional wall motion abnormalities. Left ventricular diastolic parameters were normal.  2. Right ventricular systolic function is normal. The right ventricular size is normal. Tricuspid regurgitation signal is inadequate for assessing PA pressure.  3. The mitral valve is grossly normal. Trivial mitral valve regurgitation.  4. The aortic valve is tricuspid. Aortic valve regurgitation is not visualized. Aortic valve mean gradient measures 5.0 mmHg.  5. The inferior vena cava is dilated in size with >50% respiratory variability, suggesting right atrial pressure of 8 mmHg. Comparison(s): No prior Echocardiogram. FINDINGS  Left Ventricle: Left ventricular ejection fraction, by estimation, is 60 to 65%. The left ventricle has normal function. The left ventricle has no regional wall motion abnormalities. The left ventricular internal cavity size was normal in size. There is  no left ventricular hypertrophy. Left ventricular diastolic parameters were normal. Right Ventricle: The right ventricular size is normal. No increase in right ventricular wall thickness. Right ventricular systolic function is normal. Tricuspid regurgitation signal is inadequate for assessing PA pressure. Left Atrium: Left atrial size was normal in size. Right Atrium: Right atrial size was normal in size. Pericardium: There is no evidence of pericardial effusion. Mitral Valve: The  mitral valve is grossly normal. Trivial mitral valve regurgitation. MV peak gradient, 4.4 mmHg. The mean mitral valve gradient is 1.0 mmHg. Tricuspid Valve: The tricuspid valve is grossly normal. Tricuspid valve regurgitation is trivial. Aortic Valve: The aortic valve is tricuspid. There is mild aortic valve annular calcification. Aortic valve regurgitation is not visualized. Aortic valve mean gradient measures 5.0 mmHg. Aortic valve peak gradient measures 8.2 mmHg. Aortic valve area, by  VTI measures 2.31 cm. Pulmonic Valve: The pulmonic valve was grossly normal. Pulmonic valve regurgitation is trivial. Aorta: The aortic root is normal in size and structure. Venous: The inferior vena cava is dilated in size with greater than 50% respiratory variability, suggesting right atrial pressure of 8 mmHg. IAS/Shunts: No atrial level shunt detected by color flow Doppler.  LEFT VENTRICLE PLAX 2D LVIDd:         5.36 cm  Diastology LVIDs:         3.27 cm  LV e' medial:    10.90 cm/s LV PW:         0.98 cm  LV E/e' medial:  9.5 LV IVS:        0.91 cm  LV e' lateral:   15.50 cm/s LVOT diam:     1.90 cm  LV E/e' lateral: 6.7 LV  SV:         85 LV SV Index:   43 LVOT Area:     2.84 cm  RIGHT VENTRICLE RV Basal diam:  3.05 cm RV Mid diam:    3.21 cm RV S prime:     16.50 cm/s TAPSE (M-mode): 3.5 cm LEFT ATRIUM             Index       RIGHT ATRIUM           Index LA diam:        3.80 cm 1.91 cm/m  RA Area:     12.60 cm LA Vol (A2C):   61.4 ml 30.90 ml/m RA Volume:   28.40 ml  14.29 ml/m LA Vol (A4C):   44.9 ml 22.59 ml/m LA Biplane Vol: 51.0 ml 25.66 ml/m  AORTIC VALVE AV Area (Vmax):    2.52 cm AV Area (Vmean):   2.26 cm AV Area (VTI):     2.31 cm AV Vmax:           143.00 cm/s AV Vmean:          101.000 cm/s AV VTI:            0.368 m AV Peak Grad:      8.2 mmHg AV Mean Grad:      5.0 mmHg LVOT Vmax:         127.00 cm/s LVOT Vmean:        80.600 cm/s LVOT VTI:          0.300 m LVOT/AV VTI ratio: 0.82 MITRAL VALVE MV Area  (PHT): 3.91 cm     SHUNTS MV Area VTI:   2.39 cm     Systemic VTI:  0.30 m MV Peak grad:  4.4 mmHg     Systemic Diam: 1.90 cm MV Mean grad:  1.0 mmHg MV Vmax:       1.05 m/s MV Vmean:      45.0 cm/s MV Decel Time: 194 msec MV E velocity: 104.00 cm/s MV A velocity: 57.20 cm/s MV E/A ratio:  1.82 Nona DellSamuel Amaliya Whitelaw MD Electronically signed by Nona DellSamuel Erron Wengert MD Signature Date/Time: 06/10/2021/12:14:18 PM    Final     Assessment & Plan    1.  Syncopal event, vasovagal etiology suspected as discussed in initial consultation.  Also suspect witnessed seizure-like activity was likely secondary to cerebral hypoperfusion with this event.  LVEF 60 to 65% by echocardiogram, no significant valvular abnormalities.  Cardiac rhythm has been stable under observation with sinus bradycardia noted, but not clearly symptomatic.  2.  Chronic back pain, possibly also contributing to problem #1.  At this point no further inpatient cardiac testing is planned.  We will set up a 14-day Zio patch as an outpatient and then schedule office follow-up for further review.  Defer potential neurological evaluation to primary team.  Signed, Nona DellSamuel Parnika Tweten, MD  06/11/2021, 8:25 AM

## 2021-06-11 NOTE — Progress Notes (Signed)
[facility] night shift ICU coverage note.  The nursing staff reported that the patient requested as nicotine patch.  She weights about 83 kg and smokes about a quarter of a pack daily.  NicoDerm 14 mg every day starting now ordered.  Sanda Klein, MD.

## 2021-06-12 ENCOUNTER — Other Ambulatory Visit: Payer: Self-pay | Admitting: Cardiology

## 2021-06-12 DIAGNOSIS — R55 Syncope and collapse: Secondary | ICD-10-CM

## 2021-06-12 DIAGNOSIS — R001 Bradycardia, unspecified: Secondary | ICD-10-CM

## 2021-06-12 DIAGNOSIS — M5441 Lumbago with sciatica, right side: Secondary | ICD-10-CM

## 2021-06-12 DIAGNOSIS — M5442 Lumbago with sciatica, left side: Secondary | ICD-10-CM

## 2021-06-12 LAB — CBC WITH DIFFERENTIAL/PLATELET
Abs Immature Granulocytes: 0.02 10*3/uL (ref 0.00–0.07)
Basophils Absolute: 0 10*3/uL (ref 0.0–0.1)
Basophils Relative: 0 %
Eosinophils Absolute: 0.1 10*3/uL (ref 0.0–0.5)
Eosinophils Relative: 1 %
HCT: 34.2 % — ABNORMAL LOW (ref 36.0–46.0)
Hemoglobin: 11.5 g/dL — ABNORMAL LOW (ref 12.0–15.0)
Immature Granulocytes: 0 %
Lymphocytes Relative: 36 %
Lymphs Abs: 2.8 10*3/uL (ref 0.7–4.0)
MCH: 32 pg (ref 26.0–34.0)
MCHC: 33.6 g/dL (ref 30.0–36.0)
MCV: 95.3 fL (ref 80.0–100.0)
Monocytes Absolute: 0.7 10*3/uL (ref 0.1–1.0)
Monocytes Relative: 9 %
Neutro Abs: 4.1 10*3/uL (ref 1.7–7.7)
Neutrophils Relative %: 54 %
Platelets: 192 10*3/uL (ref 150–400)
RBC: 3.59 MIL/uL — ABNORMAL LOW (ref 3.87–5.11)
RDW: 12 % (ref 11.5–15.5)
WBC: 7.7 10*3/uL (ref 4.0–10.5)
nRBC: 0 % (ref 0.0–0.2)

## 2021-06-12 LAB — COMPREHENSIVE METABOLIC PANEL
ALT: 25 U/L (ref 0–44)
AST: 24 U/L (ref 15–41)
Albumin: 3.5 g/dL (ref 3.5–5.0)
Alkaline Phosphatase: 54 U/L (ref 38–126)
Anion gap: 7 (ref 5–15)
BUN: 6 mg/dL (ref 6–20)
CO2: 27 mmol/L (ref 22–32)
Calcium: 8.7 mg/dL — ABNORMAL LOW (ref 8.9–10.3)
Chloride: 106 mmol/L (ref 98–111)
Creatinine, Ser: 0.76 mg/dL (ref 0.44–1.00)
GFR, Estimated: >60 mL/min (ref >60)
Glucose, Bld: 101 mg/dL — ABNORMAL HIGH (ref 70–99)
Potassium: 3.5 mmol/L (ref 3.5–5.1)
Sodium: 140 mmol/L (ref 135–145)
Total Bilirubin: 0.4 mg/dL (ref 0.3–1.2)
Total Protein: 5.9 g/dL — ABNORMAL LOW (ref 6.5–8.1)

## 2021-06-12 LAB — PHOSPHORUS: Phosphorus: 3.3 mg/dL (ref 2.5–4.6)

## 2021-06-12 LAB — MAGNESIUM: Magnesium: 1.8 mg/dL (ref 1.7–2.4)

## 2021-06-12 MED ORDER — POTASSIUM CHLORIDE CRYS ER 20 MEQ PO TBCR
40.0000 meq | EXTENDED_RELEASE_TABLET | Freq: Once | ORAL | Status: AC
  Administered 2021-06-12: 40 meq via ORAL
  Filled 2021-06-12: qty 2

## 2021-06-12 MED ORDER — POTASSIUM CHLORIDE CRYS ER 20 MEQ PO TBCR: 40.0000 meq | EXTENDED_RELEASE_TABLET | Freq: Once | ORAL | Status: DC

## 2021-06-12 MED ORDER — ALPRAZOLAM 0.5 MG PO TABS
1.0000 mg | ORAL_TABLET | Freq: Once | ORAL | Status: AC
  Administered 2021-06-12: 1 mg via ORAL
  Filled 2021-06-12: qty 2

## 2021-06-12 MED ORDER — MAGNESIUM OXIDE -MG SUPPLEMENT 400 (240 MG) MG PO TABS
400.0000 mg | ORAL_TABLET | Freq: Once | ORAL | Status: AC
  Administered 2021-06-12: 400 mg via ORAL
  Filled 2021-06-12: qty 1

## 2021-06-12 NOTE — Progress Notes (Signed)
[facility] night shift ICU coverage note.  The nursing staff communicated to me that the patient besides taking alprazolam at bedtime also takes it as needed during the day.  She is currently having some difficulty sleeping despite receiving the 1 mg dose of alprazolam earlier.  Alprazolam 1 mg p.o. x1 dose ordered now.  Further doses should be discussed with primary team during dayshift.  Sanda Klein, MD.

## 2021-06-12 NOTE — Progress Notes (Signed)
   Progress Note  Patient Name: Mckenzie Baker Date of Encounter: 06/12/2021  Primary Cardiologist: New to [facility]  Interval chart reviewed.  Also reviewed telemetry.  Zio patch was placed yesterday so that in place for discharge.  No further cardiac testing planned at this time as an inpatient.  We have arranged follow-up in the office and we will sign off for now.  Signed, Nona Dell, MD  06/12/2021, 8:17 AM

## 2021-06-12 NOTE — Evaluation (Signed)
Occupational Therapy Evaluation Patient Details Name: Mckenzie Baker MRN: [id number] DOB: 1976-07-06 Today's Date: 06/12/2021    History of Present Illness 45 yo WF with hx of anxiety, chronic neck pain, who lives in [place], [place](currently visiting her mother and her daughter), presents to the ER today after having a witnessed seizure-like activity at home.  Patient is visiting her mother and her daughter.  Patient's daughter lives with the patient's mother (grandmother and granddaughter live together).  Patient states that she woke up this morning feeling somewhat tired.  She spent most the day watching TV.   Patient states that she has felt warm most of the day.  She not have a fever that she knows of.  She has not had any diarrhea, cough.    Around 4-5 o'clock today patient was standing near the stove making dinner for herself.  The patient's niece who is also a local EMT was at the house.  The EMT witnessed the patient standing at the stove then suddenly slumped over.  She was slumped over the stove and then fell backwards.  EMT states the patient was having seizure-like activity.  Patient's pupils were dilated.  She had tremors of her upper extremities and lower extremities.  Her lower extremities were stiff.  This lasted for approximately 30 seconds.  Patient was postictal afterwards.  Patient did not have any urinary incontinence   Clinical Impression   Pt agreeable to OT evaluation. Pt demonstrates bed mobility and functional mobility within the room with independence. Pt reports intermittent blurred vision but pt was not experiencing it at the time. Pt did not appear to have any deficits other than baseline visual deficits. Pt demonstrates good B UE strength. Pt does report numbness in B LE which is not a new sensation. Pt is not recommended for further acute OT services and will be discharged to care of nursing staff for the remainder of her stay.     Follow Up Recommendations  No OT follow  up    Equipment Recommendations  Tub/shower seat    Recommendations for Other Services  (Pt informed she may want an evuation from an optemetrist due to intermittent blurred vision.)     Precautions / Restrictions Precautions Precautions: None Restrictions Weight Bearing Restrictions: No      Mobility Bed Mobility Overal bed mobility: Independent                  Transfers Overall transfer level: Independent                    Balance Overall balance assessment: Independent                                         ADL either performed or assessed with clinical judgement   ADL Overall ADL's : Independent                                             Vision Baseline Vision/History: 1 Wears glasses Ability to See in Adequate Light: 0 Adequate Patient Visual Report: Blurring of vision (Pt reports intermittent blurred vision.) Vision Assessment?: No apparent visual deficits (other than baseline deficits)                Pertinent Vitals/Pain Pain Assessment:  0-10 Pain Score: 6  Pain Location: low back and neck Pain Descriptors / Indicators: Burning Pain Intervention(s): Limited activity within patient's tolerance;Monitored during session;Repositioned     Hand Dominance Right   Extremity/Trunk Assessment Upper Extremity Assessment Upper Extremity Assessment: Overall WFL for tasks assessed   Lower Extremity Assessment Lower Extremity Assessment: Defer to PT evaluation   Cervical / Trunk Assessment Cervical / Trunk Assessment: Normal   Communication Communication Communication: No difficulties   Cognition Arousal/Alertness: Awake/alert Behavior During Therapy: WFL for tasks assessed/performed Overall Cognitive Status: Within Functional Limits for tasks assessed                                                      Home Living Family/patient expects to be discharged to:: Private  residence Living Arrangements: Spouse/significant other Available Help at Discharge: Family;Available PRN/intermittently Type of Home: Mobile home Home Access: Stairs to enter Entrance Stairs-Number of Steps: 4 Entrance Stairs-Rails: Right;Left;Can reach both Home Layout: One level     Bathroom Shower/Tub: Chief Strategy Officer: Standard Bathroom Accessibility: No   Home Equipment: Cane - single point          Prior Functioning/Environment Level of Independence: Independent with assistive device(s)        Comments: Pt reports using cane PRN for ambulation.                 OT Goals(Current goals can be found in the care plan section) Acute Rehab OT Goals Patient Stated Goal: return home   End of Session    Activity Tolerance: Patient tolerated treatment well Patient left: in bed;with call bell/phone within reach  OT Visit Diagnosis: History of falling (Z91.81)                Time: 6237-6283 OT Time Calculation (min): 11 min Charges:  OT General Charges $OT Visit: 1 Visit OT Evaluation $OT Eval Low Complexity: 1 Low  Howie Rufus OT, MOT  Danie Chandler 06/12/2021, 9:39 AM

## 2021-06-12 NOTE — Progress Notes (Signed)
Discharge paperwork and instructions provided to patient. No questions or concerns voiced from patient. PIV removed. All belongings returned. Awaiting patient's ride.

## 2021-06-12 NOTE — Discharge Summary (Signed)
Physician Discharge Summary  OLIVEAH ZWACK DEY:[id number] DOB: [birth year] DOA: 06/09/2021  PCP: Gareth Morgan, MD  Admit date: 06/09/2021 Discharge date: 06/12/2021  Time spent: 35 minutes  Recommendations for Outpatient Follow-up:   Bradycardia with 31-40 beats per minute Admit to ICU. Pacer pads. Continue with IVF.  0.5 mg IV atropine for persistent HR <30 bpm.  Check echo. Check TSH, lyme serologies and rickettsial serologies.  Negative -8/31 bradycardia improved -9/1 per cardiology no further inpatient cardiac testing is planned.  We will set up a 14-day Zio patch as an outpatient and then schedule office follow-up for further review.    Witnessed seizure-like activity (HCC) Pt has been taking her xanax daily. Has not missed any doses. May need neurology consult in the AM and EEG. - Currently patient A/O x4 no further signs of seizure activity.  We will hold on neurology consult -9/1 will obtain baseline EEG.  ADDENDUM notified unable to provide EEG today.   Cannabis abuse, daily use -Daily marijuana use ---> nausea secondary to Cannabis Hyperemesis Syndrome?.   -Her nausea could possibly cause her to hyperstimulate her vagus nerve. Will give 1 dose of droperidol to see if this helps with her nausea.  -Pt states she does not like hot showers because it makes her light headed, which would argue against hyperemesis syndrome. -Counseled to discontinue use of cannabis.  Provided with resources by social work.    Cervical pain/Lumbar fusion: Increasing lower extremity weakness/loss sensation -Increasing cervical pain and L-spine pain. - If patient has impingement of nerves in cervical area may be contributing/causing bradycardia/seizure activity. - Increasing lower extremity weakness/paresthesia: Secondary to hardware failure?  Worsening DJD/disc protrusion? -Obtain C-spine, T-spine, L-spine x-ray - Obtain L-spine, T-spine, C-spine MRI -9/1 neural foraminal narrowing at multiple  levels, osteophytes, retrolithiasis in lumbar region see results below. - 9/1 PT/OT consult: Patient with increased lower extremity weakness and loss of sensation evaluate for CIR vs SNF: No follow-up - 9/1 social work consult: Evaluate for resources available in order that patient may see neurosurgery in follow-up as outpatient for pain control (no insurance).  Also resources to help with discontinuing use of cannabis.  Hypokalemia - Potassium goal> 4 - K-Dur 40 mEq prior to discharge  Hypomagnesmia - Magnesium goal> 2 - Magnesium oxide 400 mg prior to discharge    Discharge Diagnoses:  Principal Problem:   Bradycardia with 31-40 beats per minute Active Problems:   Witnessed seizure-like activity (HCC)   Cannabis abuse, daily use   Chronic neck pain   Discharge Condition: Stable  Diet recommendation: Heart healthy  Filed Weights   06/09/21 1900 06/10/21 0107  Weight: 74.8 kg 82.8 kg    History of present illness:  45 yo WF with hx of anxiety, chronic neck pain, who lives in [place], [place](currently visiting her mother and her daughter), presents to the ER today after having a witnessed seizure-like activity at home.  Patient is visiting her mother and her daughter.  Patient's daughter lives with the patient's mother (grandmother and granddaughter live together).  Patient states that she woke up this morning feeling somewhat tired.  She spent most the day watching TV.   Patient states that she has felt warm most of the day.  She not have a fever that she knows of.  She has not had any diarrhea, cough.  Around 4-5 o'clock today patient was standing near the stove making dinner for herself.  The patient's niece who is also a local EMT was at the house.  The EMT witnessed the patient standing at the stove then suddenly slumped over.  She was slumped over the stove and then fell backwards.  EMT states the patient was having seizure-like activity.  Patient's pupils were dilated.  She  had tremors of her upper extremities and lower extremities.  Her lower extremities were stiff.  This lasted for approximately 30 seconds.  Patient was postictal afterwards.  Patient did not have any urinary incontinence.  Patient does not recall any events after standing near the stove.  She denies having a history of seizure disorder.  Patient does take Xanax every day but has not missed any doses.  Patient states that she did wean herself off of Adderall this month.  Patient states that she does not drink any alcohol.  She does smoke marijuana every day.  In the ER, patient noted to be profoundly bradycardic with heart rates in the high 30s and 40s.  This was captured on EKG.  It appears to be in atrial based rhythm although to be inverted in leads II and III.   ER providers contacted cardiology who did not think the patient need to be transferred to main campus.   Due to the patient's persistent bradycardia, [facility] hospitalist contacted for admission.  During the interview process with the patient and her mother, the patient had persistent heart rates in the low 30s as.  As low as 32 bpm.  Patient's lips were somewhat cyanotic.  She was diaphoretic.  Patient states that she did not feel well was having some chest pain.  0.5 mg of IV atropine were given with increase in her heart rates to the 80s and 90s beats per minute.  Patient be admitted to the intensive care unit for further monitoring.    ED Course: CT head negative, labs workup negative. Pt noted to be persistently bradycardic with HR in the low 30s, pt was diaphoretic with cyanosis of the lips. 0.5 mg IV atropine given with increase in her HR to 80-90 bpm  Hospital Course:  See above  Procedures: 8/31 Echocardiogram LVEF= 60 to 65%  8/31 MRI C-spine/T-spine/L-spine W0 contrast: Degenerative changes of the cervical spine with mild spinal canal stenosis at C4-5 and C5-6. Moderate right neural foraminal narrowing at C5-6 and  C6-7. 2. Mild degenerative changes of the thoracic spine without significant spinal canal or neural foraminal stenosis at any level. 3. Postsurgical changes at L4-5 without residual spinal canal or neural foraminal stenosis. 4. Degenerative changes at L3-4 with small retrolisthesis and mild narrowing of the bilateral subarticular zones. 5. Degenerative changes at L5-S1 with mild narrowing of the bilateral subarticular zones and mild bilateral neural foraminal narrowing. 6. Mild diffuse decrease of the T1 signal within the visualized spine, may represent red marrow reconversion in the setting of anemia. (i.e. Studies not automatically included, echos, thoracentesis, etc; not x-rays)  Consultations: Cardiology    Discharge Exam: Vitals:   06/12/21 0500 06/12/21 0600 06/12/21 0700 06/12/21 0800  BP: 123/72 122/74 120/78 (!) 125/102  Pulse: (!) 51 (!) 59 (!) 48 (!) 127  Resp:      Temp: 98 F (36.7 C)  98.2 F (36.8 C)   TempSrc: Oral  Oral   SpO2: 98% 97% 97% 99%  Weight:      Height:        General: A/O x4, No acute respiratory distress Eyes: negative scleral hemorrhage, negative anisocoria, negative icterus ENT: Negative Runny nose, negative gingival bleeding, Neck:  Negative scars, masses, torticollis, lymphadenopathy, JVD  positive tenderness to palpation midline C5-C7 Lungs: Clear to auscultation bilaterally without wheezes or crackles    Discharge Instructions   Allergies as of 06/12/2021   No Known Allergies      Medication List     STOP taking these medications    amphetamine-dextroamphetamine 20 MG tablet Commonly known as: ADDERALL   aspirin 325 MG tablet   carisoprodol 350 MG tablet Commonly known as: SOMA   naproxen 500 MG tablet Commonly known as: NAPROSYN   Norethindrone Acetate-Ethinyl Estrad-FE 1-20 MG-MCG(24) tablet Commonly known as: LOESTRIN 24 FE   ondansetron 4 MG tablet Commonly known as: ZOFRAN   temazepam 30 MG  capsule Commonly known as: RESTORIL       TAKE these medications    ALPRAZolam 1 MG tablet Commonly known as: XANAX Take 1 mg by mouth at bedtime as needed for anxiety.   ALPRAZolam 1 MG tablet Commonly known as: XANAX Take by mouth.   methocarbamol 500 MG tablet Commonly known as: ROBAXIN Take by mouth.   oxyCODONE-acetaminophen 5-325 MG tablet Commonly known as: PERCOCET/ROXICET Take 1 tablet by mouth every 8 (eight) hours as needed for severe pain.       No Known Allergies  Follow-up Information     Laurann Montana, PA-C Follow up on 07/20/2021.   Specialties: Cardiology, Radiology Why: Cardiology Follow-up on 07/20/2021 at 1:00 PM. The office will arrange for a monitor in the interim. If you need to switch to a phone/virtual visit, this can be arranged as well. Contact information: [address] [place] [address] [phone]                  The results of significant diagnostics from this hospitalization (including imaging, microbiology, ancillary and laboratory) are listed below for reference.    Significant Diagnostic Studies: DG Cervical Spine 2 or 3 views  Result Date: 06/10/2021 CLINICAL DATA:  Neck pain several days EXAM: CERVICAL SPINE - 2-3 VIEW COMPARISON:  MRI cervical spine 06/10/2021 FINDINGS: Suboptimal study. The lateral view is oblique and there is overlying humerus over the cervical spine. Limited detail. AP view and open-mouth view are negative. IMPRESSION: Suboptimal study.  Recommend repeat lateral view. Electronically Signed   By: Marlan Palau M.D.   On: 06/10/2021 16:28   DG Thoracic Spine 2 View  Result Date: 06/10/2021 CLINICAL DATA:  Back pain several days EXAM: THORACIC SPINE 2 VIEWS COMPARISON:  MRI thoracic spine 06/10/2021 FINDINGS: There is no evidence of thoracic spine fracture. Alignment is normal. No other significant bone abnormalities are identified. IMPRESSION: Negative. Electronically Signed   By: Marlan Palau  M.D.   On: 06/10/2021 16:30   DG Lumbar Spine 2-3 Views  Result Date: 06/10/2021 CLINICAL DATA:  Back pain several days EXAM: LUMBAR SPINE - 2-3 VIEW COMPARISON:  MRI lumbar spine 06/10/2021 FINDINGS: Mild retrolisthesis L2-3 and L3-4. PLIF L4-5 with solid interbody bone fusion. Disc degeneration and spurring L5-S1 Negative for fracture or mass Distended large and small bowel loops compatible with ileus. IMPRESSION: PLIF L4-5 with solid fusion Lumbar degenerative changes as above Adynamic ileus. Electronically Signed   By: Marlan Palau M.D.   On: 06/10/2021 16:29   CT HEAD WO CONTRAST ( )  Result Date: 06/09/2021 CLINICAL DATA:  Status post seizure. EXAM: CT HEAD WITHOUT CONTRAST TECHNIQUE: Contiguous axial images were obtained from the base of the skull through the vertex without intravenous contrast. COMPARISON:  None. FINDINGS: Brain: No evidence of acute infarction, hemorrhage, hydrocephalus, extra-axial collection or  mass lesion/mass effect. Vascular: No hyperdense vessel or unexpected calcification. Skull: Normal. Negative for fracture or focal lesion. Sinuses/Orbits: There is marked severity right maxillary sinus mucosal thickening. Other: None. IMPRESSION: 1. No acute intracranial abnormality. 2. Marked severity right maxillary sinus mucosal thickening. An underlying polyp versus mucous retention cyst can not be excluded. Electronically Signed   By: Aram Candelahaddeus  Houston M.D.   On: 06/09/2021 20:23   CT Cervical Spine Wo Contrast  Result Date: 06/09/2021 CLINICAL DATA:  Status post seizure. EXAM: CT CERVICAL SPINE WITHOUT CONTRAST TECHNIQUE: Multidetector CT imaging of the cervical spine was performed without intravenous contrast. Multiplanar CT image reconstructions were also generated. COMPARISON:  None. FINDINGS: Alignment: Normal. Skull base and vertebrae: No acute fracture. No primary bone lesion or focal pathologic process. Soft tissues and spinal canal: No prevertebral fluid or swelling.  No visible canal hematoma. Disc levels: Marked severity endplate sclerosis and anterior osteophyte formation is seen at the levels of C5-C6 and C6-C7. Mild intervertebral disc space narrowing is seen at the levels of C5-C6 and C6-C7. Normal, bilateral multilevel facet joints are noted. Upper chest: Negative. Other: None. IMPRESSION: 1. Marked severity degenerative changes at the levels of C5-C6 and C6-C7. 2. No evidence of an acute fracture or subluxation. Electronically Signed   By: Aram Candelahaddeus  Houston M.D.   On: 06/09/2021 20:25   MR CERVICAL SPINE WO CONTRAST  Result Date: 06/10/2021 CLINICAL DATA:  Increased lower extremity weakness. Symptomatic bradycardia. Mid back pain, neuro deficit. EXAM: MRI CERVICAL, THORACIC AND LUMBAR SPINE WITHOUT CONTRAST TECHNIQUE: Multiplanar and multiecho pulse sequences of the cervical spine, to include the craniocervical junction and cervicothoracic junction, and thoracic and lumbar spine, were obtained without intravenous contrast. COMPARISON:  CT of the cervical spine June 09, 2021. MRI of the lumbar spine July 11, 2014. FINDINGS: MRI CERVICAL SPINE FINDINGS Alignment: Straightening of the cervical curvature. Vertebrae: Diffuse decrease of the T1 signal within the visualized spine, may represent red marrow reconversion in the setting of anemia. No evidence discitis. Endplate degenerative changes C5-6 and C6-7. Cord: Limited evaluation due to motion artifact. No gross cord signal abnormality. Posterior Fossa, vertebral arteries, paraspinal tissues: Negative. Disc levels: C2-3: No spinal canal or neural foraminal stenosis. C3-4: No spinal canal or neural foraminal stenosis. C4-5: Uncovertebral and facet degenerative changes resulting in mild right neural foraminal narrowing. No spinal canal stenosis. C5-6: Posterior disc osteophyte complex resulting in mild spinal canal stenosis. Uncovertebral and facet degenerative changes resulting in moderate right neural foraminal  narrowing. C6-7: Posterior disc osteophyte complex resulting in mild spinal canal stenosis. Uncovertebral and facet degenerative changes resulting in moderate right and mild left neural foraminal narrowing. C7-T1: No spinal canal or neural foraminal stenosis. MRI THORACIC SPINE FINDINGS Alignment:  Physiologic. Vertebrae: No fracture, evidence of discitis, or bone lesion. Diffuse decrease of the T1 signal within the visualized spine, may represent red marrow reconversion in the setting of anemia. Cord:  Normal signal and morphology. Paraspinal and other soft tissues: Negative. Disc levels: A central disc protrusion at T7-8 causing small indentation of the thecal sac without significant spinal canal or neural foraminal stenosis. No significant disc herniation, spinal canal or neural foraminal stenosis in the other thoracic levels. MRI LUMBAR SPINE FINDINGS Segmentation:  Standard. Alignment:  New small retrolisthesis of L3 over L4. Vertebrae: No fracture, evidence of discitis, or bone lesion. Diffuse decrease of the T1 signal within the visualized spine, may represent red marrow reconversion in the setting of anemia. Interval postsurgical changes from interbody fusion, laminectomy  and posterior fixation at L4-5. Conus medullaris and cauda equina: Conus extends to the L1 level. Conus and cauda equina appear normal. Paraspinal and other soft tissues: Negative. Disc levels: T12-L1: No spinal canal or neural foraminal stenosis. L1-2: No spinal canal or neural foraminal stenosis. L2-3: Shallow disc bulge and mild facet degenerative changes. No significant spinal canal or neural foraminal stenosis. L3-4: Disc bulge, hypertrophic facet degenerative changes mild ligamentum flavum resulting in mild narrowing of the bilateral subarticular zones. No significant neural foraminal narrowing. L4-5: No residual spinal canal or neural foraminal stenosis. L5-S1: Loss disc height, disc bulge superimposed right central protrusion,  mild-to-moderate facet degenerative changes resulting in mild narrowing of the bilateral subarticular zones and mild bilateral neural foraminal narrowing. No significant spinal canal stenosis. IMPRESSION: 1. 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 Moderate right neural foraminal narrowing at C5-6 and C6-7. 2. Mild degenerative changes of the thoracic spine without significant spinal canal or neural foraminal stenosis at any level. 3. Postsurgical changes at L4-5 without residual spinal canal or neural foraminal stenosis. 4. Degenerative changes at L3-4 with small retrolisthesis and mild narrowing of the bilateral subarticular zones. 5. Degenerative changes at L5-S1 with mild narrowing of the bilateral subarticular zones and mild bilateral neural foraminal narrowing. 6. Mild diffuse decrease of the T1 signal within the visualized spine, may represent red marrow reconversion in the setting of anemia. Electronically Signed   By: Baldemar Lenis M.D.   On: 06/10/2021 14:45   MR THORACIC SPINE WO CONTRAST  Result Date: 06/10/2021 CLINICAL DATA:  Increased lower extremity weakness. Symptomatic bradycardia. Mid back pain, neuro deficit. EXAM: MRI CERVICAL, THORACIC AND LUMBAR SPINE WITHOUT CONTRAST TECHNIQUE: Multiplanar and multiecho pulse sequences of the cervical spine, to include the craniocervical junction and cervicothoracic junction, and thoracic and lumbar spine, were obtained without intravenous contrast. COMPARISON:  CT of the cervical spine June 09, 2021. MRI of the lumbar spine July 11, 2014. FINDINGS: MRI CERVICAL SPINE FINDINGS Alignment: Straightening of the cervical curvature. Vertebrae: Diffuse decrease of the T1 signal within the visualized spine, may represent red marrow reconversion in the setting of anemia. No evidence discitis. Endplate degenerative changes C5-6 and C6-7. Cord: Limited evaluation due to motion artifact. No  gross cord signal abnormality. Posterior Fossa, vertebral arteries, paraspinal tissues: Negative. Disc levels: C2-3: No spinal canal or neural foraminal stenosis. C3-4: No spinal canal or neural foraminal stenosis. C4-5: Uncovertebral and facet degenerative changes resulting in mild right neural foraminal narrowing. No spinal canal stenosis. C5-6: Posterior disc osteophyte complex resulting in mild spinal canal stenosis. Uncovertebral and facet degenerative changes resulting in moderate right neural foraminal narrowing. C6-7: Posterior disc osteophyte complex resulting in mild spinal canal stenosis. Uncovertebral and facet degenerative changes resulting in moderate right and mild left neural foraminal narrowing. C7-T1: No spinal canal or neural foraminal stenosis. MRI THORACIC SPINE FINDINGS Alignment:  Physiologic. Vertebrae: No fracture, evidence of discitis, or bone lesion. Diffuse decrease of the T1 signal within the visualized spine, may represent red marrow reconversion in the setting of anemia. Cord:  Normal signal and morphology. Paraspinal and other soft tissues: Negative. Disc levels: A central disc protrusion at T7-8 causing small indentation of the thecal sac without significant spinal canal or neural foraminal stenosis. No significant disc herniation, spinal canal or neural foraminal stenosis in the other thoracic levels. MRI LUMBAR SPINE FINDINGS Segmentation:  Standard. Alignment:  New small retrolisthesis of L3 over L4. Vertebrae: No fracture,  evidence of discitis, or bone lesion. Diffuse decrease of the T1 signal within the visualized spine, may represent red marrow reconversion in the setting of anemia. Interval postsurgical changes from interbody fusion, laminectomy and posterior fixation at L4-5. Conus medullaris and cauda equina: Conus extends to the L1 level. Conus and cauda equina appear normal. Paraspinal and other soft tissues: Negative. Disc levels: T12-L1: No spinal canal or neural  foraminal stenosis. L1-2: No spinal canal or neural foraminal stenosis. L2-3: Shallow disc bulge and mild facet degenerative changes. No significant spinal canal or neural foraminal stenosis. L3-4: Disc bulge, hypertrophic facet degenerative changes mild ligamentum flavum resulting in mild narrowing of the bilateral subarticular zones. No significant neural foraminal narrowing. L4-5: No residual spinal canal or neural foraminal stenosis. L5-S1: Loss disc height, disc bulge superimposed right central protrusion, mild-to-moderate facet degenerative changes resulting in mild narrowing of the bilateral subarticular zones and mild bilateral neural foraminal narrowing. No significant spinal canal stenosis. IMPRESSION: 1. 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 Moderate right neural foraminal narrowing at C5-6 and C6-7. 2. Mild degenerative changes of the thoracic spine without significant spinal canal or neural foraminal stenosis at any level. 3. Postsurgical changes at L4-5 without residual spinal canal or neural foraminal stenosis. 4. Degenerative changes at L3-4 with small retrolisthesis and mild narrowing of the bilateral subarticular zones. 5. Degenerative changes at L5-S1 with mild narrowing of the bilateral subarticular zones and mild bilateral neural foraminal narrowing. 6. Mild diffuse decrease of the T1 signal within the visualized spine, may represent red marrow reconversion in the setting of anemia. Electronically Signed   By: Baldemar Lenis M.D.   On: 06/10/2021 14:45   MR LUMBAR SPINE WO CONTRAST  Result Date: 06/10/2021 CLINICAL DATA:  Increased lower extremity weakness. Symptomatic bradycardia. Mid back pain, neuro deficit. EXAM: MRI CERVICAL, THORACIC AND LUMBAR SPINE WITHOUT CONTRAST TECHNIQUE: Multiplanar and multiecho pulse sequences of the cervical spine, to include the craniocervical junction and cervicothoracic junction, and  thoracic and lumbar spine, were obtained without intravenous contrast. COMPARISON:  CT of the cervical spine June 09, 2021. MRI of the lumbar spine July 11, 2014. FINDINGS: MRI CERVICAL SPINE FINDINGS Alignment: Straightening of the cervical curvature. Vertebrae: Diffuse decrease of the T1 signal within the visualized spine, may represent red marrow reconversion in the setting of anemia. No evidence discitis. Endplate degenerative changes C5-6 and C6-7. Cord: Limited evaluation due to motion artifact. No gross cord signal abnormality. Posterior Fossa, vertebral arteries, paraspinal tissues: Negative. Disc levels: C2-3: No spinal canal or neural foraminal stenosis. C3-4: No spinal canal or neural foraminal stenosis. C4-5: Uncovertebral and facet degenerative changes resulting in mild right neural foraminal narrowing. No spinal canal stenosis. C5-6: Posterior disc osteophyte complex resulting in mild spinal canal stenosis. Uncovertebral and facet degenerative changes resulting in moderate right neural foraminal narrowing. C6-7: Posterior disc osteophyte complex resulting in mild spinal canal stenosis. Uncovertebral and facet degenerative changes resulting in moderate right and mild left neural foraminal narrowing. C7-T1: No spinal canal or neural foraminal stenosis. MRI THORACIC SPINE FINDINGS Alignment:  Physiologic. Vertebrae: No fracture, evidence of discitis, or bone lesion. Diffuse decrease of the T1 signal within the visualized spine, may represent red marrow reconversion in the setting of anemia. Cord:  Normal signal and morphology. Paraspinal and other soft tissues: Negative. Disc levels: A central disc protrusion at T7-8 causing small indentation of the thecal sac without significant spinal canal or neural foraminal stenosis. No  significant disc herniation, spinal canal or neural foraminal stenosis in the other thoracic levels. MRI LUMBAR SPINE FINDINGS Segmentation:  Standard. Alignment:  New small  retrolisthesis of L3 over L4. Vertebrae: No fracture, evidence of discitis, or bone lesion. Diffuse decrease of the T1 signal within the visualized spine, may represent red marrow reconversion in the setting of anemia. Interval postsurgical changes from interbody fusion, laminectomy and posterior fixation at L4-5. Conus medullaris and cauda equina: Conus extends to the L1 level. Conus and cauda equina appear normal. Paraspinal and other soft tissues: Negative. Disc levels: T12-L1: No spinal canal or neural foraminal stenosis. L1-2: No spinal canal or neural foraminal stenosis. L2-3: Shallow disc bulge and mild facet degenerative changes. No significant spinal canal or neural foraminal stenosis. L3-4: Disc bulge, hypertrophic facet degenerative changes mild ligamentum flavum resulting in mild narrowing of the bilateral subarticular zones. No significant neural foraminal narrowing. L4-5: No residual spinal canal or neural foraminal stenosis. L5-S1: Loss disc height, disc bulge superimposed right central protrusion, mild-to-moderate facet degenerative changes resulting in mild narrowing of the bilateral subarticular zones and mild bilateral neural foraminal narrowing. No significant spinal canal stenosis. IMPRESSION: 1. 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 Moderate right neural foraminal narrowing at C5-6 and C6-7. 2. Mild degenerative changes of the thoracic spine without significant spinal canal or neural foraminal stenosis at any level. 3. Postsurgical changes at L4-5 without residual spinal canal or neural foraminal stenosis. 4. Degenerative changes at L3-4 with small retrolisthesis and mild narrowing of the bilateral subarticular zones. 5. Degenerative changes at L5-S1 with mild narrowing of the bilateral subarticular zones and mild bilateral neural foraminal narrowing. 6. Mild diffuse decrease of the T1 signal within the visualized spine, may represent red  marrow reconversion in the setting of anemia. Electronically Signed   By: Baldemar Lenis M.D.   On: 06/10/2021 14:45   DG Chest Portable 1 View  Result Date: 06/09/2021 CLINICAL DATA:  Vomiting and seizures. EXAM: PORTABLE CHEST 1 VIEW COMPARISON:  November 23, 2013 FINDINGS: The heart size and mediastinal contours are within normal limits. Both lungs are clear. The visualized skeletal structures are unremarkable. IMPRESSION: No active cardiopulmonary disease. Electronically Signed   By: Aram Candela M.D.   On: 06/09/2021 20:36   ECHOCARDIOGRAM COMPLETE  Result Date: 06/10/2021    ECHOCARDIOGRAM REPORT   Patient Name:   Mckenzie Baker Date of Exam: 06/10/2021 Medical Rec #:  [id number]        Height:       69.0 in Accession #:    [id number]       Weight:       182.5 lb Date of Birth:  Jul 14, 1976        BSA:          1.987 m Patient Age:    45 years         BP:           117/48 mmHg Patient Gender: F                HR:           51 bpm. [facility] Procedure: 2D Echo, Cardiac Doppler and Color Doppler Indications:    Chest Pain  History:        Patient has no prior history of Echocardiogram examinations.                 Arrythmias:Bradycardia.  Witnessed seizure-like activity.  Sonographer:    Mikki Harbor Referring Phys: [phone] ERIC CHEN IMPRESSIONS  1. Left ventricular ejection fraction, by estimation, is 60 to 65%. The left ventricle has normal function. The left ventricle has no regional wall motion abnormalities. Left ventricular diastolic parameters were normal.  2. Right ventricular systolic function is normal. The right ventricular size is normal. Tricuspid regurgitation signal is inadequate for assessing PA pressure.  3. The mitral valve is grossly normal. Trivial mitral valve regurgitation.  4. The aortic valve is tricuspid. Aortic valve regurgitation is not visualized. Aortic valve mean gradient measures 5.0 mmHg.  5. The inferior vena cava is dilated in size  with >50% respiratory variability, suggesting right atrial pressure of 8 mmHg. Comparison(s): No prior Echocardiogram. FINDINGS  Left Ventricle: Left ventricular ejection fraction, by estimation, is 60 to 65%. The left ventricle has normal function. The left ventricle has no regional wall motion abnormalities. The left ventricular internal cavity size was normal in size. There is  no left ventricular hypertrophy. Left ventricular diastolic parameters were normal. Right Ventricle: The right ventricular size is normal. No increase in right ventricular wall thickness. Right ventricular systolic function is normal. Tricuspid regurgitation signal is inadequate for assessing PA pressure. Left Atrium: Left atrial size was normal in size. Right Atrium: Right atrial size was normal in size. Pericardium: There is no evidence of pericardial effusion. Mitral Valve: The mitral valve is grossly normal. Trivial mitral valve regurgitation. MV peak gradient, 4.4 mmHg. The mean mitral valve gradient is 1.0 mmHg. Tricuspid Valve: The tricuspid valve is grossly normal. Tricuspid valve regurgitation is trivial. Aortic Valve: The aortic valve is tricuspid. There is mild aortic valve annular calcification. Aortic valve regurgitation is not visualized. Aortic valve mean gradient measures 5.0 mmHg. Aortic valve peak gradient measures 8.2 mmHg. Aortic valve area, by  VTI measures 2.31 cm. Pulmonic Valve: The pulmonic valve was grossly normal. Pulmonic valve regurgitation is trivial. Aorta: The aortic root is normal in size and structure. Venous: The inferior vena cava is dilated in size with greater than 50% respiratory variability, suggesting right atrial pressure of 8 mmHg. IAS/Shunts: No atrial level shunt detected by color flow Doppler.  LEFT VENTRICLE PLAX 2D LVIDd:         5.36 cm  Diastology LVIDs:         3.27 cm  LV e' medial:    10.90 cm/s LV PW:         0.98 cm  LV E/e' medial:  9.5 LV IVS:        0.91 cm  LV e' lateral:   15.50  cm/s LVOT diam:     1.90 cm  LV E/e' lateral: 6.7 LV SV:         85 LV SV Index:   43 LVOT Area:     2.84 cm  RIGHT VENTRICLE RV Basal diam:  3.05 cm RV Mid diam:    3.21 cm RV S prime:     16.50 cm/s TAPSE (M-mode): 3.5 cm LEFT ATRIUM             Index       RIGHT ATRIUM           Index LA diam:        3.80 cm 1.91 cm/m  RA Area:     12.60 cm LA Vol (A2C):   61.4 ml 30.90 ml/m RA Volume:   28.40 ml  14.29 ml/m LA Vol (A4C):   44.9 ml 22.59  ml/m LA Biplane Vol: 51.0 ml 25.66 ml/m  AORTIC VALVE AV Area (Vmax):    2.52 cm AV Area (Vmean):   2.26 cm AV Area (VTI):     2.31 cm AV Vmax:           143.00 cm/s AV Vmean:          101.000 cm/s AV VTI:            0.368 m AV Peak Grad:      8.2 mmHg AV Mean Grad:      5.0 mmHg LVOT Vmax:         127.00 cm/s LVOT Vmean:        80.600 cm/s LVOT VTI:          0.300 m LVOT/AV VTI ratio: 0.82 MITRAL VALVE MV Area (PHT): 3.91 cm     SHUNTS MV Area VTI:   2.39 cm     Systemic VTI:  0.30 m MV Peak grad:  4.4 mmHg     Systemic Diam: 1.90 cm MV Mean grad:  1.0 mmHg MV Vmax:       1.05 m/s MV Vmean:      45.0 cm/s MV Decel Time: 194 msec MV E velocity: 104.00 cm/s MV A velocity: 57.20 cm/s MV E/A ratio:  1.82 Nona Dell MD Electronically signed by Nona Dell MD Signature Date/Time: 06/10/2021/12:14:18 PM    Final     Microbiology: Recent Results (from the past 240 hour(s))  Resp Panel by RT-PCR (Flu A&B, Covid) Nasopharyngeal Swab     Status: None   Collection Time: 06/09/21  8:54 PM   Specimen: Nasopharyngeal Swab; Nasopharyngeal(NP) swabs in vial transport medium  Result Value Ref Range Status   SARS Coronavirus 2 by RT PCR NEGATIVE NEGATIVE Final    Comment: (NOTE) SARS-CoV-2 target nucleic acids are NOT DETECTED.  The SARS-CoV-2 RNA is generally detectable in upper respiratory specimens during the acute phase of infection. The lowest concentration of SARS-CoV-2 viral copies this assay can detect is 138 copies/mL. A negative result does not preclude  SARS-Cov-2 infection and should not be used as the sole basis for treatment or other patient management decisions. A negative result may occur with  improper specimen collection/handling, submission of specimen other than nasopharyngeal swab, presence of viral mutation(s) within the areas targeted by this assay, and inadequate number of viral copies(<138 copies/mL). A negative result must be combined with clinical observations, patient history, and epidemiological information. The expected result is Negative.  Fact Sheet for Patients:  BloggerCourse.com  Fact Sheet for Healthcare Providers:  SeriousBroker.it  This test is no t yet approved or cleared by the [place] FDA and  has been authorized for detection and/or diagnosis of SARS-CoV-2 by FDA under an Emergency Use Authorization (EUA). This EUA will remain  in effect (meaning this test can be used) for the duration of the COVID-19 declaration under Section 564(b)(1) of the Act, 21 U.S.C.section 360bbb-3(b)(1), unless the authorization is terminated  or revoked sooner.       Influenza A by PCR NEGATIVE NEGATIVE Final   Influenza B by PCR NEGATIVE NEGATIVE Final    Comment: (NOTE) The Xpert Xpress SARS-CoV-2/FLU/RSV plus assay is intended as an aid in the diagnosis of influenza from Nasopharyngeal swab specimens and should not be used as a sole basis for treatment. Nasal washings and aspirates are unacceptable for Xpert Xpress SARS-CoV-2/FLU/RSV testing.  Fact Sheet for Patients: BloggerCourse.com  Fact Sheet for Healthcare Providers: SeriousBroker.it  This test is  not yet approved or cleared by the [place] and has been authorized for detection and/or diagnosis of SARS-CoV-2 by FDA under an Emergency Use Authorization (EUA). This EUA will remain in effect (meaning this test can be used) for the duration of  the COVID-19 declaration under Section 564(b)(1) of the Act, 21 U.S.C. section 360bbb-3(b)(1), unless the authorization is terminated or revoked.  Performed at [facility], [address], [place], [place] [address]   MRSA Next Gen by PCR, Nasal     Status: None   Collection Time: 06/10/21 11:31 AM   Specimen: Nasal Mucosa; Nasal Swab  Result Value Ref Range Status   MRSA by PCR Next Gen NOT DETECTED NOT DETECTED Final    Comment: (NOTE) The GeneXpert MRSA Assay (FDA approved for NASAL specimens only), is one component of a comprehensive MRSA colonization surveillance program. It is not intended to diagnose MRSA infection nor to guide or monitor treatment for MRSA infections. Test performance is not FDA approved in patients less than 97 years old. Performed at [facility], [address] [place], [place] [address]      Labs: Basic Metabolic Panel: Recent Labs  Lab 06/09/21 1923 06/10/21 0427 06/11/21 0446 06/12/21 0446  NA 136 136 139 140  K 3.7 3.9 3.4* 3.5  CL 103 106 105 106  CO2 23 24 30 27   GLUCOSE 170* 106* 93 101*  BUN 14 12 8 6   CREATININE 1.21* 0.82 0.79 0.76  CALCIUM 9.2 8.2* 8.8* 8.7*  MG 2.8* 2.1 1.9 1.8  PHOS  --   --  3.3 3.3   Liver Function Tests: Recent Labs  Lab 06/09/21 1923 06/10/21 0427 06/11/21 0446 06/12/21 0446  AST 37 20 24 24   ALT 5 16 18 25   ALKPHOS 78 59 58 54  BILITOT 0.5 0.4 0.5 0.4  PROT 7.8 6.3* 6.2* 5.9*  ALBUMIN 4.5 3.6 3.7 3.5   Recent Labs  Lab 06/09/21 1923  LIPASE 19   No results for input(s): AMMONIA in the last 168 hours. CBC: Recent Labs  Lab 06/09/21 1923 06/10/21 0427 06/11/21 0446 06/12/21 0446  WBC 13.5* 16.9* 7.9 7.7  NEUTROABS  --  14.8* 4.4 4.1  HGB 14.4 12.0 12.2 11.5*  HCT 42.7 35.2* 36.6 34.2*  MCV 93.6 93.6 96.1 95.3  PLT 292 213 182 192   Cardiac Enzymes: No results for input(s): CKTOTAL, CKMB, CKMBINDEX, TROPONINI in the last 168 hours. BNP: BNP (last 3 results) No results for  input(s): BNP in the last 8760 hours.  ProBNP (last 3 results) No results for input(s): PROBNP in the last 8760 hours.  CBG: Recent Labs  Lab 06/09/21 1904  GLUCAP 156*       Signed:  08/11/21, MD [facility]

## 2021-07-08 ENCOUNTER — Telehealth: Payer: Self-pay | Admitting: Physician Assistant

## 2021-07-08 ENCOUNTER — Telehealth: Payer: Self-pay

## 2021-07-08 NOTE — Telephone Encounter (Signed)
-----   Message from Jonelle Sidle, MD sent at 07/08/2021 10:56 AM EDT ----- Results reviewed.  Patient already scheduled for office follow-up after recent hospital consultation.  Cardiac monitor did not show any prolonged episodes of bradycardia with average heart rate in the 70s.  She had only rare atrial and ventricular ectopy.  There were 2 brief episodes of second-degree type I block with no significant pauses, this would not be expected to cause any symptoms.  Evaluation in the hospital raised likelihood of vasovagal syncope, monitor does not confirm any other specific rhythms of concern at this point.  Keep follow-up as scheduled.

## 2021-07-08 NOTE — Telephone Encounter (Signed)
  Patient Consent for Virtual Visit        Mckenzie Baker has provided verbal consent on 07/08/2021 for a virtual visit (video or telephone).   CONSENT FOR VIRTUAL VISIT FOR:  Mckenzie Baker  By participating in this virtual visit I agree to the following:  I hereby voluntarily request, consent and authorize [facility] and its employed or contracted physicians, physician assistants, nurse practitioners or other licensed health care professionals (the Practitioner), to provide me with telemedicine health care services (the "Services") as deemed necessary by the treating Practitioner. I acknowledge and consent to receive the Services by the Practitioner via telemedicine. I understand that the telemedicine visit will involve communicating with the Practitioner through live audiovisual communication technology and the disclosure of certain medical information by electronic transmission. I acknowledge that I have been given the opportunity to request an in-person assessment or other available alternative prior to the telemedicine visit and am voluntarily participating in the telemedicine visit.  I understand that I have the right to withhold or withdraw my consent to the use of telemedicine in the course of my care at any time, without affecting my right to future care or treatment, and that the Practitioner or I may terminate the telemedicine visit at any time. I understand that I have the right to inspect all information obtained and/or recorded in the course of the telemedicine visit and may receive copies of available information for a reasonable fee.  I understand that some of the potential risks of receiving the Services via telemedicine include:  Delay or interruption in medical evaluation due to technological equipment failure or disruption; Information transmitted may not be sufficient (e.g. poor resolution of images) to allow for appropriate medical decision making by the Practitioner;  and/or  In rare instances, security protocols could fail, causing a breach of personal health information.  Furthermore, I acknowledge that it is my responsibility to provide information about my medical history, conditions and care that is complete and accurate to the best of my ability. I acknowledge that Practitioner's advice, recommendations, and/or decision may be based on factors not within their control, such as incomplete or inaccurate data provided by me or distortions of diagnostic images or specimens that may result from electronic transmissions. I understand that the practice of medicine is not an exact science and that Practitioner makes no warranties or guarantees regarding treatment outcomes. I acknowledge that a copy of this consent can be made available to me via my patient portal [facility] MyChart), or I can request a printed copy by calling the office of [facility].    I understand that my insurance will be billed for this visit.   I have read or had this consent read to me. I understand the contents of this consent, which adequately explains the benefits and risks of the Services being provided via telemedicine.  I have been provided ample opportunity to ask questions regarding this consent and the Services and have had my questions answered to my satisfaction. I give my informed consent for the services to be provided through the use of telemedicine in my medical care

## 2021-07-08 NOTE — Telephone Encounter (Signed)
Pt notified of results. Pt unaware of upcoming visit with D. Dunn, PA-C. Pt stated that she now lives in [place]. Pt agreeable to being scheduled as VV.

## 2021-07-19 ENCOUNTER — Encounter: Payer: Self-pay | Admitting: Physician Assistant

## 2021-07-19 NOTE — Progress Notes (Signed)
Virtual Visit via Telephone Note   This visit type was conducted due to national recommendations for restrictions regarding the COVID-19 Pandemic (e.g. social distancing) in an effort to limit this patient's exposure and mitigate transmission in our community.  Due to her co-morbid illnesses, this patient is at least at moderate risk for complications without adequate follow up.  This format is felt to be most appropriate for this patient at this time.  The patient did not have access to video technology/had technical difficulties with video requiring transitioning to audio format only (telephone).  All issues noted in this document were discussed and addressed.  No physical exam could be performed with this format.  Please refer to the patient's chart for her  consent to telehealth for [facility].   The patient was identified using 2 identifiers.  Date:  07/20/2021   ID:  Mckenzie Baker, DOB Feb 28, 1976, MRN [id number]  Patient Location: Home Provider Location: Office/Clinic  PCP:  Gareth Morgan, MD  Cardiologist:  Nona Dell, MD  Electrophysiologist:  None   Evaluation Performed:  Follow-Up Visit  Chief Complaint:  f/u  History of Present Illness:    Mckenzie Baker is a 45 y.o. female with history of anxiety, depression, fibromyalgia, GERD, IBS, cannibis use presents for follow-up of recent hospitalization for syncope vs seizure. Cardiac consult note reviewed. On 06/09/21, she was in the kitchen when she developed intense pain along her back and neck. She then began to shake and collapse to the floor for about 30-60 seconds. She was confused upon awakening. In the ED, she was noted to have episodic bradycardia in her evaluation. Per consult note, her HR would drop into the 30's and she did receive IV Atropine. Dr. Diona Browner had reviewed this and felt that telemetry showed NSR/SB with isorhythmic dissociation along with junctional rhythm and also had a low atrial rhythm  evident. There was no evidence of high grade heart block. RMSF and Lyme were negative. 2D Echo showed normal EF, dilated IVC (after IV fluids) otherwise unrevealing. CT head without acute intracranial abnormality.  She had not had much oral intake that day. Her labwork did demonstrate mild AKI on admission with resolution and a change in her Hgb from 14-11 range after IV fluids, so question dehydration as well. The episode was ultimately felt to be vasovagal in etiology with witnessed seizure activity felt secondary to cerebral hypoperfusion with the event. It was felt she may benefit from seeing neurology as an outpatient. Outpatient event monitor showed predominantly NSR with average HR 74bpm, range 42 to 165bpm, rare PACs/PVCs,  few brief and not clearly symptomatic episodes of second-degree type I block (Wenkebach conduction) with no significant pauses.  She is seen back virtually by phone today because she has since moved to [place]. She states that she did not have any recurrent episodes whatsoever while wearing the monitor. However, she has begun to have recurrent paroxysms of transient dizziness since then. She has previously attributed them to panic attacks, associated by a feeling of dread, then sweating and dizziness. There are no obvious inciting factors. She has not fully passed out since the hospitalization. She otherwise does relay a history of feeling dizzy when taking hot showers as well and has purchased a shower chair. She also relays that she had a remote hospitalization with a kidney infection complicated by several months of persistent vomiting and ever since that hospitalization has struggled with chronic nausea.   Labs Independently Reviewed Last labs reviewed 06/2021 Hgb  11.5 (initial value 14.4), K 3.5, Cr 0.76 (initial value 1.21), ALT/ALT OK, Mg 1.9, UDS +bzd/THC, TSH wnl  Past Medical History:  Diagnosis Date   Anxiety    Bell's palsy    Chronic back pain    Depression     Fibromyalgia    GERD (gastroesophageal reflux disease)    Headache    History of IBS    Junctional rhythm    Second degree AV block, Mobitz type I    Sinus bradycardia    Wears glasses    Past Surgical History:  Procedure Laterality Date   DILATION AND CURETTAGE OF UTERUS     LUMBAR LAMINECTOMY/DECOMPRESSION MICRODISCECTOMY Bilateral 03/30/2013   Procedure: LUMBAR LAMINECTOMY/DECOMPRESSION MICRODISCECTOMY 1 LEVEL;  Surgeon: Hewitt Shorts, MD;  Location: [facility];  Service: Neurosurgery;  Laterality: Bilateral;  bilateral L45 laminotomy and microdiskectomy   TOOTH EXTRACTION       Current Meds  Medication Sig   ALPRAZolam (XANAX) 1 MG tablet Take 1 mg by mouth at bedtime as needed for anxiety.   ALPRAZolam (XANAX) 1 MG tablet Take 1 mg by mouth 4 (four) times daily as needed for anxiety.   amphetamine-dextroamphetamine (ADDERALL) 20 MG tablet Take 20 mg by mouth daily.   methocarbamol (ROBAXIN) 500 MG tablet Take 750 mg by mouth daily as needed for muscle spasms.   oxyCODONE-acetaminophen (PERCOCET/ROXICET) 5-325 MG tablet Take 1 tablet by mouth every 8 (eight) hours as needed for severe pain.   pantoprazole (PROTONIX) 40 MG tablet Take 40 mg by mouth daily.     Allergies:   Cyclobenzaprine   Social History   Tobacco Use   Smoking status: Every Day    Packs/day: 0.25    Types: Cigarettes   Smokeless tobacco: Never  Vaping Use   Vaping Use: Some days  Substance Use Topics   Alcohol use: Yes    Comment: occasional   Drug use: Yes    Types: Marijuana     Family Hx: The patient's family history includes Alcoholism in her father; Cancer in her father; Diabetes in her sister.  ROS:   Please see the history of present illness.    + spotty BRBPR at times which she attributed to hemorrhoids All other systems reviewed and are negative.   Prior CV studies:   The following studies were reviewed today:  2D echo 05/2021   1. Left ventricular ejection fraction, by  estimation, is 60 to 65%. The  left ventricle has normal function. The left ventricle has no regional  wall motion abnormalities. Left ventricular diastolic parameters were  normal.   2. Right ventricular systolic function is normal. The right ventricular  size is normal. Tricuspid regurgitation signal is inadequate for assessing  PA pressure.   3. The mitral valve is grossly normal. Trivial mitral valve  regurgitation.   4. The aortic valve is tricuspid. Aortic valve regurgitation is not  visualized. Aortic valve mean gradient measures 5.0 mmHg.   5. The inferior vena cava is dilated in size with >50% respiratory  variability, suggesting right atrial pressure of 8 mmHg.   Comparison(s): No prior Echocardiogram.   Event Monitor 06/2021 ZIO XT reviewed.  13 days, 3 hours analyzed.  Predominant rhythm is sinus with heart rate ranging from 42 bpm up to 165 bpm and average heart rate 74 bpm.  Rare PACs and PVCs were noted representing less than 1% total beats.  There were a few brief and not clearly symptomatic episodes of second-degree type I  block (Wenkebach conduction) with no significant pauses.    Labs/Other Tests and Data Reviewed:    EKG:  An ECG dated 06/09/21 was personally reviewed today and demonstrated:  SB 40bpm, TWI III, avF  Recent Labs: 06/09/2021: TSH 2.672 06/12/2021: ALT 25; BUN 6; Creatinine, Ser 0.76; Hemoglobin 11.5; Magnesium 1.8; Platelets 192; Potassium 3.5; Sodium 140   Recent Lipid Panel No results found for: CHOL, TRIG, HDL, CHOLHDL, LDLCALC, LDLDIRECT  Wt Readings from Last 3 Encounters:  07/20/21 170 lb (77.1 kg)  06/10/21 182 lb 8.7 oz (82.8 kg)  02/04/16 168 lb (76.2 kg)     Objective:    Vital Signs:  Ht 5\' 9"  (1.753 m)   Wt 170 lb (77.1 kg)   BMI 25.10 kg/m    VS reviewed. General - female in no acute distress Pulm - No labored breathing, no coughing during visit, no audible wheezing, speaking in full sentences Neuro - A+Ox3, no slurred speech,  answers questions appropriately Psych - Pleasant affect  ASSESSMENT & PLAN:    1. Syncope/dizziness - she did not have any episodes while wearing the monitor, but has begun to have intermittent episodes again. Historically she thought they were panic attacks but she now wonders if they perhaps could be related to recent events. We discussed repeating her cardiac monitor to capture these episodes and she'd like to pursue this. Will arrange 7 day live [facility] monitor. We also discussed checking her BP and HR during episodes and notifying for any abnormal values. I will put information on her AVS about obtaining a blood pressure cuff. I wonder if there is any component of vagal hypertonia to her bradycardia with her chronic nausea. I will refer to our electrophysiology team for further input. She also has historical dizziness while taking hot showers which is more suggestive of a separate orthostasis-type of phenomena. Since today's visit is virtual, unable to perform orthostatics at present time. We discussed importance of hydration, slight increase in sodium intake as a trial, and avoidance of triggers like hot showers. Given recent syncope, I also advised she not drive until cleared by her EP team (and possibly neurologist if they feel this is necessary).  2. Second degree type 1 AV block with recent bradycardia - will refer to EP as above. Avoid AV nodal blocking agents.  3. Anemia - Hgb went from 14.4 to 11.2 in the hospital but this have been in the setting of dilultional from IV fluids. We'll try to see if we can get her up for a CBC closer to a lab in [place].  4. Seizure-like activity - given the description of symptoms in the hospital, I think it is worthwhile to pursue neurology evaluation. She is agreeable. We will refer.  Time:   Today, I have spent 13 minutes with the patient with telehealth technology discussing the above problems.     Medication Adjustments/Labs and Tests  Ordered: Current medicines are reviewed at length with the patient today.  Testing and concerns regarding medicines are outlined above.    Follow Up:  Refer to EP. Will also arrange f/u with Dr. New Nathan in 4 months  Signed, Diona Browner, PA-C  07/20/2021 1:07 PM    [facility]

## 2021-07-20 ENCOUNTER — Other Ambulatory Visit: Payer: Self-pay

## 2021-07-20 ENCOUNTER — Encounter: Payer: Self-pay | Admitting: Physician Assistant

## 2021-07-20 ENCOUNTER — Ambulatory Visit (INDEPENDENT_AMBULATORY_CARE_PROVIDER_SITE_OTHER): Payer: Self-pay

## 2021-07-20 ENCOUNTER — Ambulatory Visit (INDEPENDENT_AMBULATORY_CARE_PROVIDER_SITE_OTHER): Payer: Self-pay | Admitting: Physician Assistant

## 2021-07-20 VITALS — Ht 69.0 in | Wt 170.0 lb

## 2021-07-20 DIAGNOSIS — R55 Syncope and collapse: Secondary | ICD-10-CM

## 2021-07-20 DIAGNOSIS — R001 Bradycardia, unspecified: Secondary | ICD-10-CM

## 2021-07-20 DIAGNOSIS — I441 Atrioventricular block, second degree: Secondary | ICD-10-CM

## 2021-07-20 DIAGNOSIS — R569 Unspecified convulsions: Secondary | ICD-10-CM

## 2021-07-20 DIAGNOSIS — D649 Anemia, unspecified: Secondary | ICD-10-CM

## 2021-07-20 NOTE — Patient Instructions (Signed)
Medication Instructions:  Your physician recommends that you continue on your current medications as directed. Please refer to the Current Medication list given to you today.  *If you need a refill on your cardiac medications before your next appointment, please call your pharmacy*   Lab Work: [place]:  CBC  If you have labs (blood work) drawn today and your tests are completely normal, you will receive your results only by: [place] (if you have MyChart) OR A paper copy in the mail If you have any lab test that is abnormal or we need to change your treatment, we will call you to review the results.   Testing/Procedures: ZIO AT Long term monitor-Live Telemetry  Your physician has requested you wear a ZIO patch monitor for 7 days.  This is a single patch monitor. [employer] supplies one patch monitor per enrollment. Additional  stickers are not available.  Please do not apply patch if you will be having a Nuclear Stress Test, Echocardiogram, Cardiac CT, MRI,  or Chest Xray during the period you would be wearing the monitor. The patch cannot be worn during  these tests. You cannot remove and re-apply the ZIO AT patch monitor.  Your ZIO patch monitor will be mailed 3 day USPS to your address on file. It may take 3-5 days to  receive your monitor after you have been enrolled.  Once you have received your monitor, please review the enclosed instructions. Your monitor has  already been registered assigning a specific monitor serial # to you.   Billing and Patient Assistance Program information  Mckenzie Baker has been supplied with any insurance information on record for billing. [employer] offers a sliding scale Patient Assistance Program for patients without insurance, or whose  insurance does not completely cover the cost of the ZIO patch monitor. You must apply for the  Patient Assistance Program to qualify for the discounted rate. To apply, call [employer] at [phone],  select  option 4, select option 2 , ask to apply for the Patient Assistance Program, (you can request an  interpreter if needed). [employer] will ask your household income and how many people are in your  household. [employer] will quote your out-of-pocket cost based on this information. They will also be able  to set up a 12 month interest free payment plan if needed.  Applying the monitor   Shave hair from upper left chest.  Hold the abrader disc by orange tab. Rub the abrader in 40 strokes over left upper chest as indicated in  your monitor instructions.  Clean area with 4 enclosed alcohol pads. Use all pads to ensure the area is cleaned thoroughly. Let  dry.  Apply patch as indicated in monitor instructions. Patch will be placed under collarbone on left side of  chest with arrow pointing upward.  Rub patch adhesive wings for 2 minutes. Remove the white label marked "1". Remove the white label  marked "2". Rub patch adhesive wings for 2 additional minutes.  While looking in a mirror, press and release button in center of patch. A small green light will flash 3-4  times. This will be your only indicator that the monitor has been turned on.  Do not shower for the first 24 hours. You may shower after the first 24 hours.  Press the button if you feel a symptom. You will hear a small click. Record Date, Time and Symptom in  the Patient Log.   Starting the Gateway  In your kit there  is a small plastic box the size of a cellphone. This is Airline pilot. It transmits all your  recorded data to [facility]. This box must always stay within 10 feet of you. Open the box and push the *  button. There will be a light that blinks orange and then green a few times. When the light stops  blinking, the Gateway is connected to the ZIO patch. Call [employer] at [phone] to confirm your monitor is transmitting.  Returning your monitor  Remove your patch and place it inside the [place]. In the lower half of the Gateway  there is a white  bag with prepaid postage on it. Place Gateway in bag and seal. Mail package back to [place] as soon as  possible. Your physician should have your final report approximately 7 days after you have mailed back  your monitor. Call [place] at [phone] if you have questions regarding your ZIO AT  patch monitor. Call them immediately if you see an orange light blinking on your monitor.  If your monitor falls off in less than 4 days, contact our Monitor department at [phone]. If your  monitor becomes loose or falls off after 4 days call [employer] at [phone] for suggestions on  securing your monitor   You have been referred to Neurology. They will contact you to schedule   You have been referred to  Electrophysiology.  They will call you to schedule.  Follow-Up: At [facility], you and your health needs are our priority.  As part of our continuing mission to provide you with exceptional heart care, we have created designated Provider Care Teams.  These Care Teams include your primary Cardiologist (physician) and Advanced Practice Providers (APPs -  Physician Assistants and Nurse Practitioners) who all work together to provide you with the care you need, when you need it.  We recommend signing up for the patient portal called "MyChart".  Sign up information is provided on this After Visit Summary.  MyChart is used to connect with patients for Virtual Visits (Telemedicine).  Patients are able to view lab/test results, encounter notes, upcoming appointments, etc.  Non-urgent messages can be sent to your provider as well.   To learn more about what you can do with MyChart, go to [email or web address].    Your next appointment:   11/18/21 ARRIVE AT 1:25   The format for your next appointment:   In Person  Provider:   Rozann Lesches, MD   Other Instructions It is important to stay well hydrated - aim for at least 64 oz of fluid per  day. Sometimes increasing your sodium intake slightly can also help prevent episodes of dizziness in the shower.   I would recommend using a blood pressure cuff that goes on your arm. The wrist ones can be inaccurate. If you're purchasing one for the first time, try to select one that also reports your heart rate because this can be helpful information as well. When you have symptoms, check your blood pressure and heart rate and notify your cardiologist if the values are abnormal.

## 2021-07-20 NOTE — Progress Notes (Unsigned)
Patient enrolled for [employer] to mail a 7 day ZIO AT monitor to her address on file.

## 2021-07-23 ENCOUNTER — Encounter: Payer: Self-pay | Admitting: Neurology

## 2021-08-28 ENCOUNTER — Other Ambulatory Visit: Payer: Self-pay

## 2021-08-28 ENCOUNTER — Ambulatory Visit (INDEPENDENT_AMBULATORY_CARE_PROVIDER_SITE_OTHER): Payer: Self-pay | Admitting: Neurology

## 2021-08-28 ENCOUNTER — Encounter: Payer: Self-pay | Admitting: Neurology

## 2021-08-28 VITALS — BP 121/89 | HR 103 | Ht 69.0 in | Wt 172.4 lb

## 2021-08-28 DIAGNOSIS — R569 Unspecified convulsions: Secondary | ICD-10-CM

## 2021-08-28 DIAGNOSIS — G629 Polyneuropathy, unspecified: Secondary | ICD-10-CM

## 2021-08-28 MED ORDER — LEVETIRACETAM 500 MG PO TABS: 500.0000 mg | ORAL_TABLET | Freq: Two times a day (BID) | ORAL | 11 refills | Status: DC

## 2021-08-28 NOTE — Patient Instructions (Signed)
Continue Keppra 500mg  twice a day  2. Continue with Cardiology follow-up and testing  3. Follow-up in 3 months, call for any changes   Seizure Precautions: 1. If medication has been prescribed for you to prevent seizures, take it exactly as directed.  Do not stop taking the medicine without talking to your doctor first, even if you have not had a seizure in a long time.   2. Avoid activities in which a seizure would cause danger to yourself or to others.  Don't operate dangerous machinery, swim alone, or climb in high or dangerous places, such as on ladders, roofs, or girders.  Do not drive unless your doctor says you may.  3. If you have any warning that you may have a seizure, lay down in a safe place where you can't hurt yourself.    4.  No driving for 6 months from last seizure, as per [facility].   Please refer to the following link on the [employer]ile[employer] of [employer]'s website for more information: http://www.epilepsyfoundation.org/answerplace/Social/driving/drivingu.cfm   5.  Maintain good sleep hygiene.   6.  Notify your neurology if you are planning pregnancy or if you become pregnant.  7.  Contact your doctor if you have any problems that may be related to the medicine you are taking.  8.  Call 911 and bring the patient back to the ED if:        A.  The seizure lasts longer than 5 minutes.       B.  The patient doesn't awaken shortly after the seizure  C.  The patient has new problems such as difficulty seeing, speaking or moving  D.  The patient was injured during the seizure  E.  The patient has a temperature over 102 F (39C)  F.  The patient vomited and now is having trouble breathing
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
## 2021-08-31 DIAGNOSIS — R569 Unspecified convulsions: Secondary | ICD-10-CM

## 2021-08-31 DIAGNOSIS — R55 Syncope and collapse: Secondary | ICD-10-CM

## 2021-08-31 DIAGNOSIS — D649 Anemia, unspecified: Secondary | ICD-10-CM

## 2021-08-31 DIAGNOSIS — R001 Bradycardia, unspecified: Secondary | ICD-10-CM

## 2021-08-31 DIAGNOSIS — I441 Atrioventricular block, second degree: Secondary | ICD-10-CM

## 2021-09-01 ENCOUNTER — Ambulatory Visit (INDEPENDENT_AMBULATORY_CARE_PROVIDER_SITE_OTHER): Payer: Self-pay | Admitting: Internal Medicine

## 2021-09-01 ENCOUNTER — Encounter: Payer: Self-pay | Admitting: Internal Medicine

## 2021-09-01 ENCOUNTER — Other Ambulatory Visit: Payer: Self-pay

## 2021-09-01 VITALS — BP 124/80 | HR 82 | Ht 69.0 in | Wt 173.2 lb

## 2021-09-01 DIAGNOSIS — R55 Syncope and collapse: Secondary | ICD-10-CM

## 2021-09-01 NOTE — Progress Notes (Signed)
HPI Mckenzie Baker is referred today by Ronie Spies for evaluation of altered mentation. The pratient is a pleasant 45 yo woman with a long h/o anxiety and a h/o meningitis as a child. She has a h/o sinus brady and AVWB. She has had several episodes of altered mentation associated with generalized tonic-clonic seizure like activity. She has bitten her tongue. No incontinence of stool or urine. She was seen by neuro and placed on keppra. She has not had any more symptoms. She denies sob. She has non-cardiac chest discomfort.  Allergies  Allergen Reactions   Cyclobenzaprine Hives and Other (See Comments)    Bad reaction      Current Outpatient Medications  Medication Sig Dispense Refill   ALPRAZolam (XANAX) 1 MG tablet Take 1 mg by mouth at bedtime as needed for anxiety.     ALPRAZolam (XANAX) 1 MG tablet Take 1 mg by mouth 4 (four) times daily as needed for anxiety.     levETIRAcetam (KEPPRA) 500 MG tablet Take 1 tablet (500 mg total) by mouth 2 (two) times daily. 60 tablet 11   methocarbamol (ROBAXIN) 500 MG tablet Take 750 mg by mouth daily as needed for muscle spasms.     oxyCODONE-acetaminophen (PERCOCET/ROXICET) 5-325 MG tablet Take 1 tablet by mouth every 8 (eight) hours as needed for severe pain. 5 tablet 0   pantoprazole (PROTONIX) 40 MG tablet Take 40 mg by mouth daily.     No current facility-administered medications for this visit.     Past Medical History:  Diagnosis Date   Anxiety    Bell's palsy    Chronic back pain    Depression    Fibromyalgia    GERD (gastroesophageal reflux disease)    Headache    History of IBS    Junctional rhythm    Second degree AV block, Mobitz type I    Sinus bradycardia    Wears glasses     ROS:   All systems reviewed and negative except as noted in the HPI.   Past Surgical History:  Procedure Laterality Date   DILATION AND CURETTAGE OF UTERUS     LUMBAR LAMINECTOMY/DECOMPRESSION MICRODISCECTOMY Bilateral 03/30/2013    Procedure: LUMBAR LAMINECTOMY/DECOMPRESSION MICRODISCECTOMY 1 LEVEL;  Surgeon: Hewitt Shorts, MD;  Location: [facility];  Service: Neurosurgery;  Laterality: Bilateral;  bilateral L45 laminotomy and microdiskectomy   TOOTH EXTRACTION       Family History  Problem Relation Age of Onset   Cancer Father    Alcoholism Father    Diabetes Sister      Social History   Socioeconomic History   Marital status: Married    Spouse name: Not on file   Number of children: Not on file   Years of education: Not on file   Highest education level: Not on file  Occupational History   Not on file  Tobacco Use   Smoking status: Every Day    Packs/day: 0.25    Types: Cigarettes   Smokeless tobacco: Never  Vaping Use   Vaping Use: Some days  Substance and Sexual Activity   Alcohol use: Not Currently    Comment: occasional   Drug use: Yes    Types: Marijuana   Sexual activity: Not Currently  Other Topics Concern   Not on file  Social History Narrative   Right handed    Social Determinants of Health   Financial Resource Strain: Not on file  Food Insecurity: Not on file  Transportation Needs: Not on file  Physical Activity: Not on file  Stress: Not on file  Social Connections: Not on file  Intimate Partner Violence: Not on file     BP 124/80   Pulse 82   Ht 5\' 9"  (1.753 m)   Wt 173 lb 3.2 oz (78.6 kg)   SpO2 99%   BMI 25.58 kg/m   Physical Exam:  Well appearing NAD HEENT: Unremarkable Neck:  No JVD, no thyromegally Lymphatics:  No adenopathy Back:  No CVA tenderness Lungs:  Clear with no wheezes HEART:  Regular rate rhythm, no murmurs, no rubs, no clicks Abd:  soft, positive bowel sounds, no organomegally, no rebound, no guarding Ext:  2 plus pulses, no edema, no cyanosis, no clubbing Skin:  No rashes no nodules Neuro:  CN II through XII intact, motor grossly intact  EKG - reviewed. NSR  Assess/Plan:  Altered mentation - I am not sure if the spells are due to  autonomic dysfunction or related to seizures. I suspect the latter. She could have both. If her symptoms resolve with Keppra I think no change. I have asked her to increase her salt intake, avoid caffeine and ETOH and THC, and to exercise and to quickly lie down if she has an episode.  Sinus brady/AVWB - I think that this is related to elevated vagal tone and is of no clinical consequence  

## 2021-09-01 NOTE — Patient Instructions (Signed)
Medication Instructions:  Your physician recommends that you continue on your current medications as directed. Please refer to the Current Medication list given to you today.  *If you need a refill on your cardiac medications before your next appointment, please call your pharmacy*   Lab Work: NONE   If you have labs (blood work) drawn today and your tests are completely normal, you will receive your results only by: MyChart Message (if you have MyChart) OR A paper copy in the mail If you have any lab test that is abnormal or we need to change your treatment, we will call you to review the results.   Testing/Procedures: NONE    Follow-Up: At [facility], you and your health needs are our priority.  As part of our continuing mission to provide you with exceptional heart care, we have created designated Provider Care Teams.  These Care Teams include your primary Cardiologist (physician) and Advanced Practice Providers (APPs -  Physician Assistants and Nurse Practitioners) who all work together to provide you with the care you need, when you need it.  We recommend signing up for the patient portal called "MyChart".  Sign up information is provided on this After Visit Summary.  MyChart is used to connect with patients for Virtual Visits (Telemedicine).  Patients are able to view lab/test results, encounter notes, upcoming appointments, etc.  Non-urgent messages can be sent to your provider as well.   To learn more about what you can do with MyChart, go to [email or web address].    Your next appointment:   6 month(s)  The format for your next appointment:   In Person  Provider:   Gregg Taylor, MD   Other Instructions Thank you for choosing [facility]!    

## 2021-10-08 IMAGING — DX DG LUMBAR SPINE 2-3V
3 series · 3 of 3 positions shown · non-contrast
Comparison: MRI lumbar spine 06/10/2021

CLINICAL DATA: Back pain several days

EXAM:
LUMBAR SPINE - 2-3 VIEW

[l-spine ap]
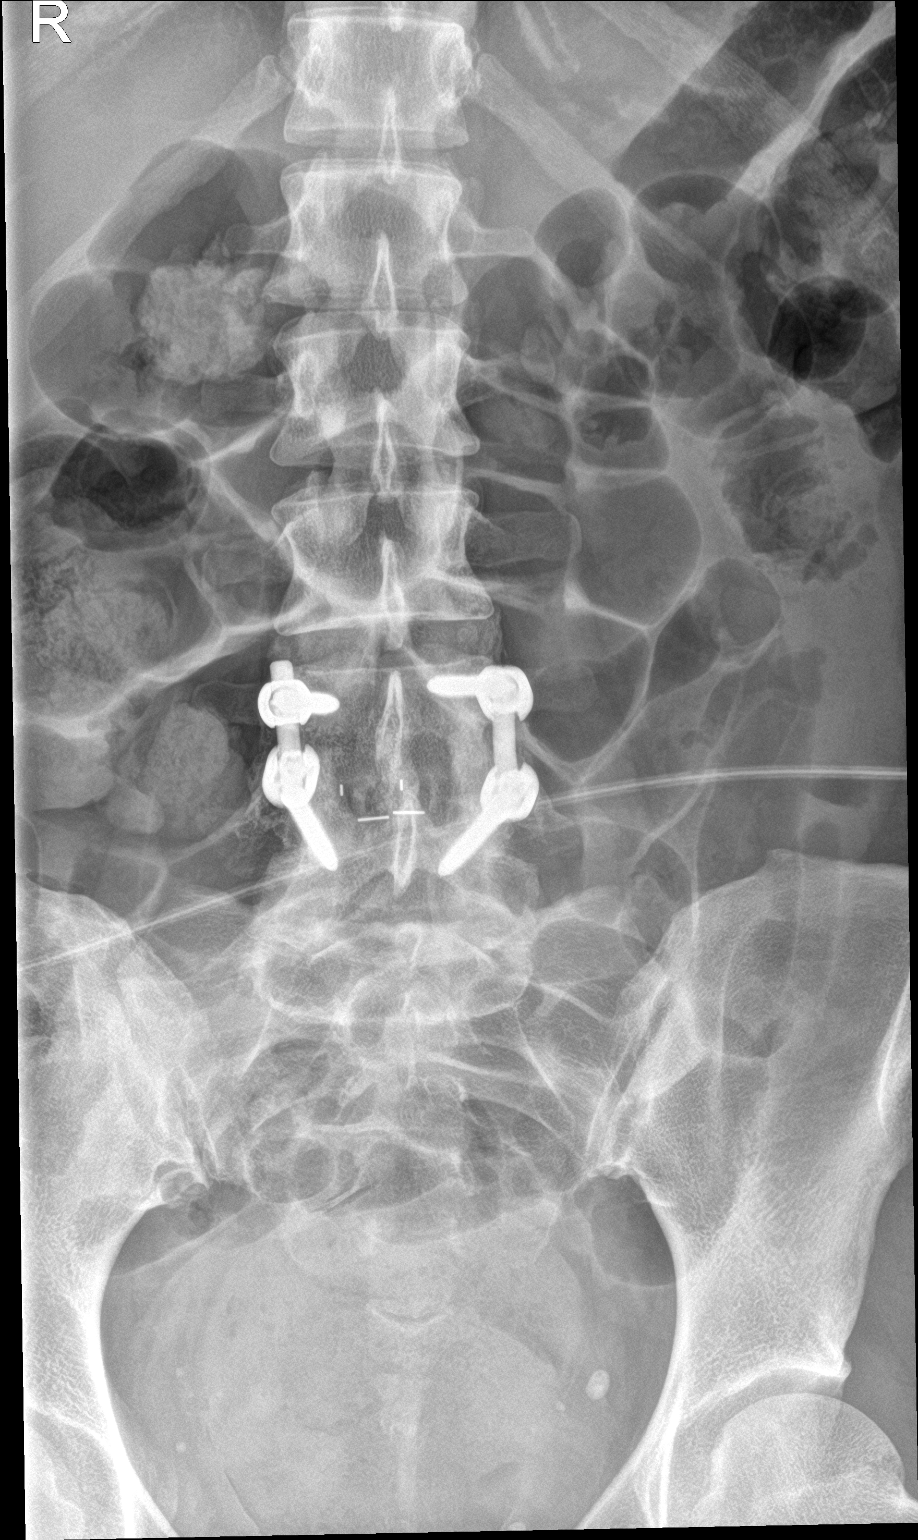

[l-spine lat]
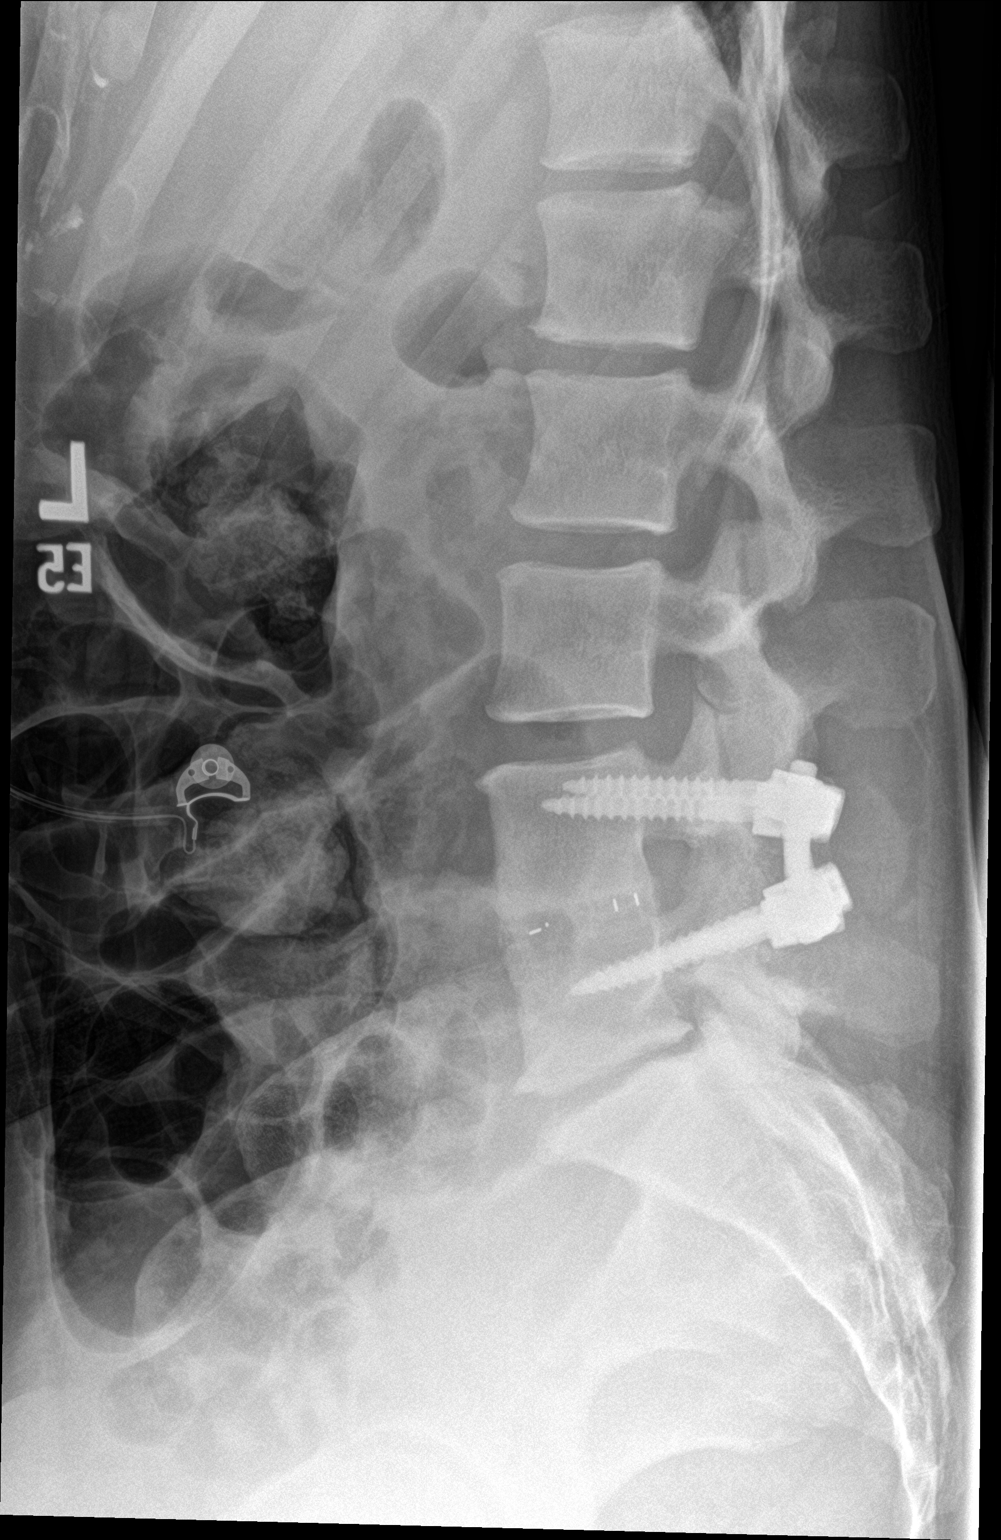

[l-spine spot]
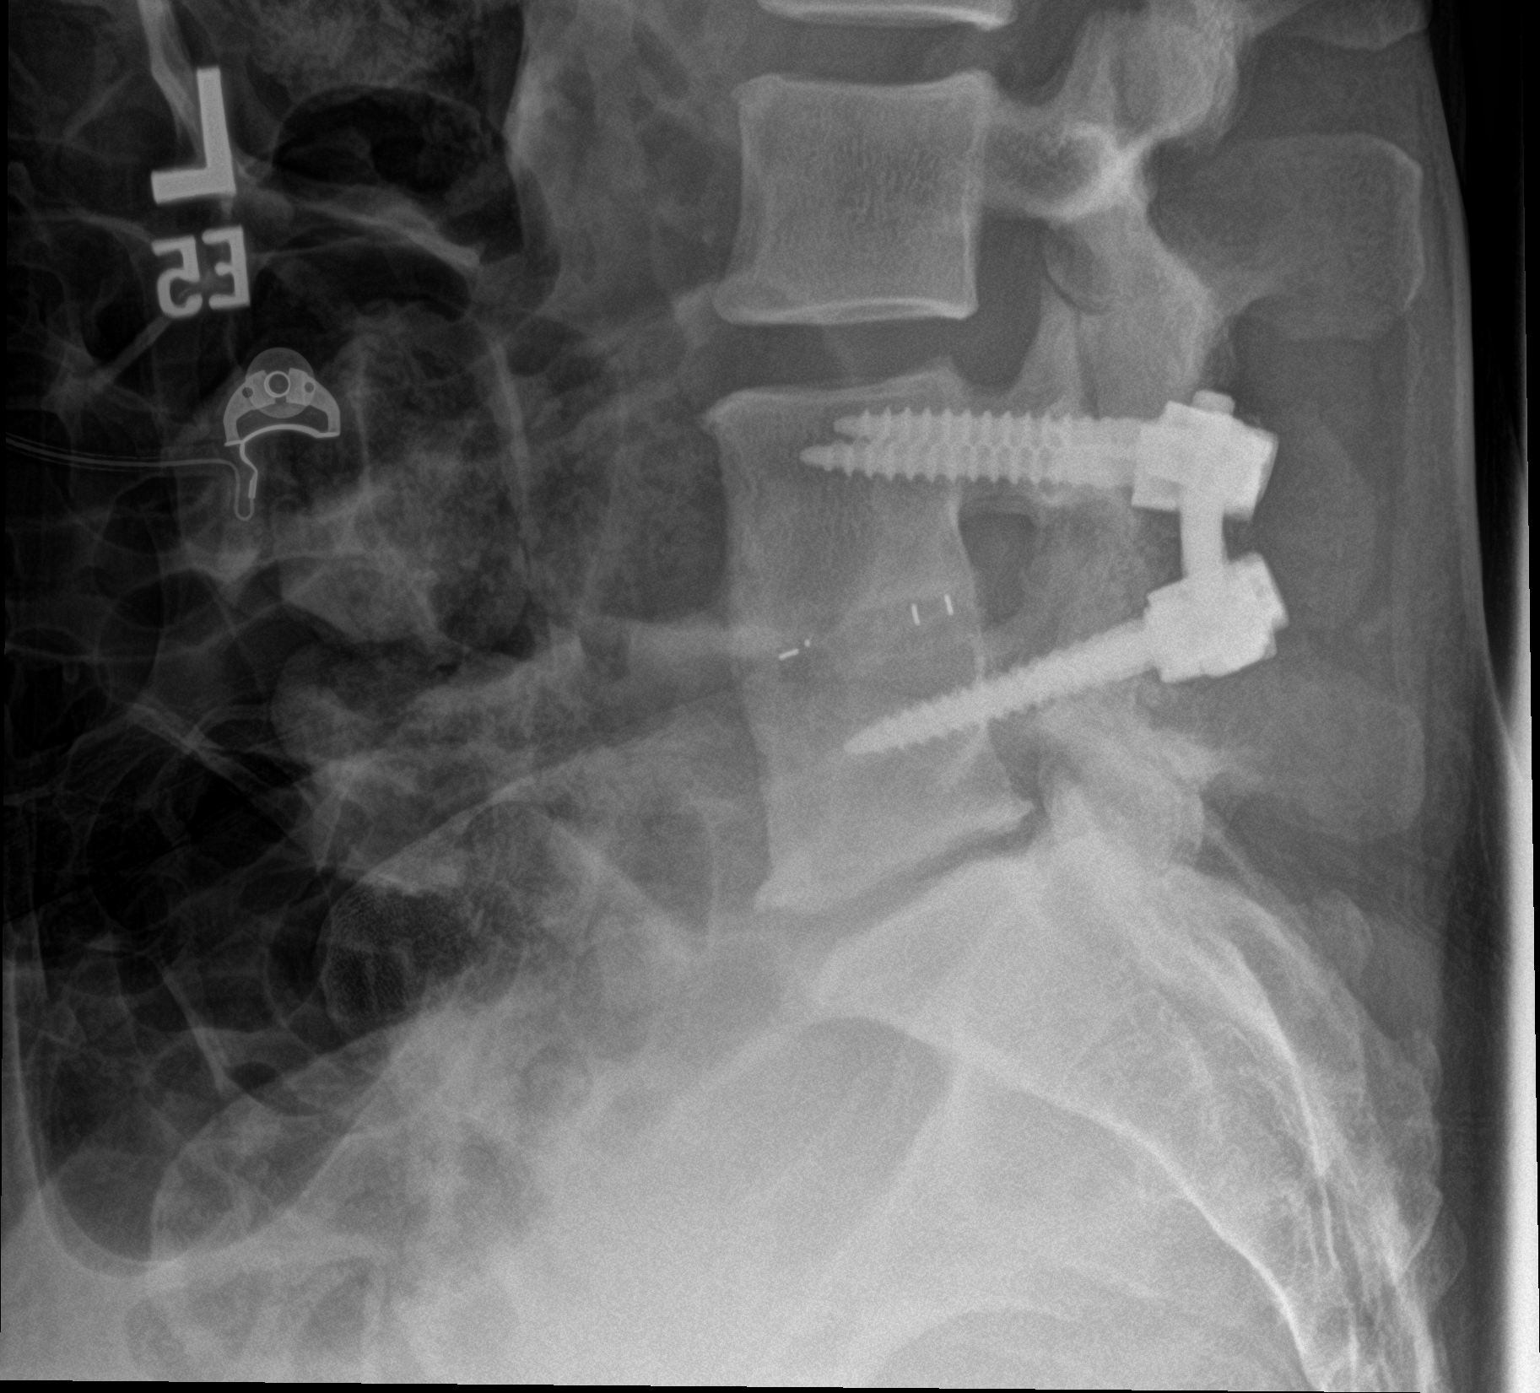

[3 of 3 positions shown; findings below may reference images not displayed]

FINDINGS: Mild retrolisthesis L2-3 and L3-4. PLIF L4-5 with solid interbody
bone fusion. Disc degeneration and spurring L5-S1

Negative for fracture or mass

Distended large and small bowel loops compatible with ileus.
IMPRESSION: PLIF L4-5 with solid fusion

Lumbar degenerative changes as above

Adynamic ileus.

## 2021-10-15 ENCOUNTER — Telehealth: Payer: Self-pay

## 2021-10-15 DIAGNOSIS — R9431 Abnormal electrocardiogram [ECG] [EKG]: Secondary | ICD-10-CM

## 2021-10-15 NOTE — Telephone Encounter (Signed)
-----   Message from Jonelle Sidle, MD sent at 10/03/2021  1:55 PM EST ----- Results reviewed.  Monitor ordered after hospital stay for assessment of bradycardia.  The present monitor does not show any sustained bradycardia or pauses.  There were two very brief episodes of second-degree Mobitz 2 block that both occurred during early morning hours presumably while sleeping.  No clear indication for pacemaker, could be secondary to increased vagal tone, also worth evaluation to exclude obstructive sleep apnea if not already done.  I see that Ms. Boulier has been evaluated by Dr. Ladona Ridgel and will forward these results to him as well.

## 2021-10-15 NOTE — Telephone Encounter (Signed)
Referral placed to pulmonary and patient is aware.

## 2021-11-18 ENCOUNTER — Ambulatory Visit: Payer: Medicaid Other | Admitting: Cardiology

## 2021-11-24 ENCOUNTER — Ambulatory Visit: Payer: Medicaid Other | Admitting: Neurology

## 2021-12-01 ENCOUNTER — Other Ambulatory Visit: Payer: Self-pay

## 2021-12-01 ENCOUNTER — Institutional Professional Consult (permissible substitution): Payer: Medicaid Other | Admitting: Pulmonary Disease

## 2021-12-01 ENCOUNTER — Encounter: Payer: Self-pay | Admitting: Neurology

## 2021-12-01 ENCOUNTER — Ambulatory Visit: Payer: BLUE CROSS/BLUE SHIELD | Admitting: Neurology

## 2021-12-01 VITALS — BP 126/73 | HR 78 | Ht 69.0 in | Wt 170.2 lb

## 2021-12-01 DIAGNOSIS — R569 Unspecified convulsions: Secondary | ICD-10-CM

## 2021-12-01 MED ORDER — LEVETIRACETAM 500 MG PO TABS: ORAL_TABLET | ORAL | 11 refills | Status: DC
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
## 2022-02-26 ENCOUNTER — Encounter: Payer: Self-pay | Admitting: Student

## 2022-02-26 ENCOUNTER — Ambulatory Visit (INDEPENDENT_AMBULATORY_CARE_PROVIDER_SITE_OTHER): Payer: BLUE CROSS/BLUE SHIELD | Admitting: Student

## 2022-02-26 VITALS — BP 110/78 | HR 76 | Ht 69.0 in | Wt 164.0 lb

## 2022-02-26 DIAGNOSIS — R55 Syncope and collapse: Secondary | ICD-10-CM | POA: Diagnosis not present

## 2022-02-26 DIAGNOSIS — R42 Dizziness and giddiness: Secondary | ICD-10-CM | POA: Diagnosis not present

## 2022-02-26 DIAGNOSIS — R569 Unspecified convulsions: Secondary | ICD-10-CM

## 2022-02-26 DIAGNOSIS — R001 Bradycardia, unspecified: Secondary | ICD-10-CM | POA: Diagnosis not present

## 2022-02-26 NOTE — Patient Instructions (Signed)
Medication Instructions:  We will mail you a box for your monitor.  Your provider would like you to get some compression socks   *If you need a refill on your cardiac medications before your next appointment, please call your pharmacy*   Lab Work: NONE   If you have labs (blood work) drawn today and your tests are completely normal, you will receive your results only by: MyChart Message (if you have MyChart) OR A paper copy in the mail If you have any lab test that is abnormal or we need to change your treatment, we will call you to review the results.   Testing/Procedures: NONE    Follow-Up: At [facility], you and your health needs are our priority.  As part of our continuing mission to provide you with exceptional heart care, we have created designated Provider Care Teams.  These Care Teams include your primary Cardiologist (physician) and Advanced Practice Providers (APPs -  Physician Assistants and Nurse Practitioners) who all work together to provide you with the care you need, when you need it.  We recommend signing up for the patient portal called "MyChart".  Sign up information is provided on this After Visit Summary.  MyChart is used to connect with patients for Virtual Visits (Telemedicine).  Patients are able to view lab/test results, encounter notes, upcoming appointments, etc.  Non-urgent messages can be sent to your provider as well.   To learn more about what you can do with MyChart, go to [email or web address].    Your next appointment:   3 month(s)  The format for your next appointment:   In Person  Provider:   Lewayne Bunting, MD    Other Instructions Thank you for choosing [facility]!    Important Information About Sugar

## 2022-02-26 NOTE — Progress Notes (Unsigned)
Cardiology Office Note    Date:  02/27/2022   ID:  Mckenzie Baker, DOB 1976/01/03, MRN [id number]  PCP:  Lemmie Evens, MD  Cardiologist: Rozann Lesches, MD    Chief Complaint  Patient presents with   Follow-up    4 month visit    History of Present Illness:    Mckenzie Baker is a 46 y.o. female with past medical history of GERD, fibromyalgia, depression and bradycardia who presents to the office today for 68-monthfollow-up.  She had a telehalth visit with DMelina Copa PA in 07/2021 following a recent hospitalization for an episode of syncope which was overall felt to be vasovagal in etiology.  She did have bradycardia during admission but no evidence of high-grade AV block.  She did wear an outpatient monitor which showed predominantly normal sinus rhythm and rare PAC's and PVC's with brief episodes of Wenckebach.  She reported not having any symptoms while wearing the monitor but had experienced recurrent episodes of intermittent dizziness.  She was referred to EP for further evaluation and met with Dr. TLovena Lein 08/2021.  Reported intermittent episodes of altered mentation but it was felt that her episodes were possibly due to seizures. She was encouraged to increase her salt intake and avoid caffeine and alcohol use. She did wear a second monitor in 09/2021 following a recurrent hospitalization and this showed predominantly normal sinus rhythm with 2 very brief episodes of second-degree Mobitz 2 which occurred during the early morning hours presumably while sleeping and was not felt to be an indication for PPM placement.  In talking with the patient today, she continues to have intermittent episodes of altered awareness which feels like an out of body experience and is unaware of any specific triggers. Neurology recently titrated her Keppra and she thinks this helped some. She does experience dizziness with positional changes and associated palpitations at times. Palpitations can  occur while resting or with activity. Says she is not active at baseline due to chronic back pain. No recent orthopnea, PND or pitting edema. She is scheduled for a sleep consult next week due to her nocturnal bradycardia and snoring.    Past Medical History:  Diagnosis Date   Anxiety    Bell's palsy    Chronic back pain    Depression    Fibromyalgia    GERD (gastroesophageal reflux disease)    Headache    History of IBS    Junctional rhythm    Second degree AV block, Mobitz type I    Sinus bradycardia    Wears glasses     Past Surgical History:  Procedure Laterality Date   DILATION AND CURETTAGE OF UTERUS     LUMBAR LAMINECTOMY/DECOMPRESSION MICRODISCECTOMY Bilateral 03/30/2013   Procedure: LUMBAR LAMINECTOMY/DECOMPRESSION MICRODISCECTOMY 1 LEVEL;  Surgeon: RHosie Spangle MD;  Location: [facility];  Service: Neurosurgery;  Laterality: Bilateral;  bilateral L45 laminotomy and microdiskectomy   TOOTH EXTRACTION      Current Medications: Outpatient Medications Prior to Visit  Medication Sig Dispense Refill   ALPRAZolam (XANAX) 1 MG tablet Take 1 mg by mouth 4 (four) times daily as needed for anxiety.     baclofen (LIORESAL) 10 MG tablet Take 10 mg by mouth 2 (two) times daily.     DULoxetine (CYMBALTA) 30 MG capsule Take 30 mg by mouth daily.     levETIRAcetam (KEPPRA) 500 MG tablet Take 1 and 1/2 tablets twice a day 90 tablet 11   oxyCODONE-acetaminophen (PERCOCET) 10-325 MG  tablet Take 1 tablet by mouth every 8 (eight) hours as needed for pain.     pantoprazole (PROTONIX) 40 MG tablet Take 40 mg by mouth as needed.     ALPRAZolam (XANAX) 1 MG tablet Take 1 mg by mouth at bedtime as needed for anxiety. (Patient not taking: Reported on 02/26/2022)     No facility-administered medications prior to visit.     Allergies:   Cyclobenzaprine   Social History   Socioeconomic History   Marital status: Married    Spouse name: Not on file   Number of children: Not on file    Years of education: Not on file   Highest education level: Not on file  Occupational History   Not on file  Tobacco Use   Smoking status: Former    Packs/day: 0.25    Types: Cigarettes   Smokeless tobacco: Never  Vaping Use   Vaping Use: Every day  Substance and Sexual Activity   Alcohol use: Not Currently    Comment: occasional   Drug use: Yes    Types: Marijuana   Sexual activity: Not Currently  Other Topics Concern   Not on file  Social History Narrative   Right handed    Social Determinants of Health   Financial Resource Strain: Not on file  Food Insecurity: Not on file  Transportation Needs: Not on file  Physical Activity: Not on file  Stress: Not on file  Social Connections: Not on file     Family History:  The patient's family history includes Alcoholism in her father; Cancer in her father; Diabetes in her sister.   Review of Systems:    Please see the history of present illness.     All other systems reviewed and are otherwise negative except as noted above.   Physical Exam:    VS:  BP 110/78   Pulse 76   Ht 5' 9"  (1.753 m)   Wt 164 lb (74.4 kg)   SpO2 97%   BMI 24.22 kg/m    General: Well developed, well nourished,female appearing in no acute distress. Head: Normocephalic, atraumatic. Neck: No carotid bruits. JVD not elevated.  Lungs: Respirations regular and unlabored, without wheezes or rales.  Heart: Regular rate and rhythm. No S3 or S4.  No murmur, no rubs, or gallops appreciated. Abdomen: Appears non-distended. No obvious abdominal masses. Msk:  Strength and tone appear normal for age. No obvious joint deformities or effusions. Extremities: No clubbing or cyanosis. No pitting edema.  Distal pedal pulses are 2+ bilaterally. Neuro: Alert and oriented X 3. Moves all extremities spontaneously. No focal deficits noted. Psych:  Responds to questions appropriately with a normal affect. Skin: No rashes or lesions noted  Wt Readings from Last 3  Encounters:  02/26/22 164 lb (74.4 kg)  12/01/21 170 lb 3.2 oz (77.2 kg)  09/01/21 173 lb 3.2 oz (78.6 kg)     Studies/Labs Reviewed:   EKG:  EKG is not ordered today.    Recent Labs: 06/09/2021: TSH 2.672 06/12/2021: ALT 25; BUN 6; Creatinine, Ser 0.76; Hemoglobin 11.5; Magnesium 1.8; Platelets 192; Potassium 3.5; Sodium 140   Lipid Panel No results found for: CHOL, TRIG, HDL, CHOLHDL, VLDL, LDLCALC, LDLDIRECT  Additional studies/ records that were reviewed today include:   Echocardiogram: 05/2021 IMPRESSIONS     1. Left ventricular ejection fraction, by estimation, is 60 to 65%. The  left ventricle has normal function. The left ventricle has no regional  wall motion abnormalities. Left ventricular diastolic parameters  were  normal.   2. Right ventricular systolic function is normal. The right ventricular  size is normal. Tricuspid regurgitation signal is inadequate for assessing  PA pressure.   3. The mitral valve is grossly normal. Trivial mitral valve  regurgitation.   4. The aortic valve is tricuspid. Aortic valve regurgitation is not  visualized. Aortic valve mean gradient measures 5.0 mmHg.   5. The inferior vena cava is dilated in size with >50% respiratory  variability, suggesting right atrial pressure of 8 mmHg.   Comparison(s): No prior Echocardiogram.   Event Monitor: 09/2021 ZIO XT reviewed.  13 days, 3 hours analyzed.  Predominant rhythm is sinus with heart rate ranging from 42 bpm up to 165 bpm and average heart rate 74 bpm.  Rare PACs and PVCs were noted representing less than 1% total beats.  There were a few brief and not clearly symptomatic episodes of second-degree type I block (Wenkebach conduction) with no significant pauses.  Assessment:    1. Dizziness   2. Syncope and collapse   3. Bradycardia   4. Seizure-like activity ([place])      Plan:   In order of problems listed above:  1. Dizziness/Episodes of AMS - Unclear etiology as this could be  related to her known seizure disorder as symptoms previously improved with Keppra dose adjustment. She does experience worsening symptoms with positional changes at times which suggests a possible orthostatic component but orthostatic vitals were negative during her visit today. I recommended she increase her fluid intake and liberalize her sodium intake. Also recommended wearing compression stockings.   2. Nocturnal Bradycardia - Prior Zio monitors showed brief episodes of Wenckebach and her second monitor showed predominantly normal sinus rhythm with 2 very brief episodes of second-degree Mobitz 2 which occurred during the early morning hours presumably while sleeping and was not felt to be an indication for PPM placement. She reports she recently located a 3rd monitor she wore temporarily and will mail this back as it was misplaced during a hospitalization. She is scheduled for a sleep consult next week and will likely undergo a sleep study for evaluation of OSA given her nocturnal bradycardia and snoring.   3. Seizure Disorder - Followed by Neurology. She remains on Keppra.    Medication Adjustments/Labs and Tests Ordered: Current medicines are reviewed at length with the patient today.  Concerns regarding medicines are outlined above.  Medication changes, Labs and Tests ordered today are listed in the Patient Instructions below. Patient Instructions  Medication Instructions:  We will mail you a box for your monitor.  Your provider would like you to get some compression socks   *If you need a refill on your cardiac medications before your next appointment, please call your pharmacy*   Lab Work: NONE   If you have labs (blood work) drawn today and your tests are completely normal, you will receive your results only by: [place] (if you have MyChart) OR A paper copy in the mail If you have any lab test that is abnormal or we need to change your treatment, we will call you to review  the results.   Testing/Procedures: NONE    Follow-Up: At [facility], you and your health needs are our priority.  As part of our continuing mission to provide you with exceptional heart care, we have created designated Provider Care Teams.  These Care Teams include your primary Cardiologist (physician) and Advanced Practice Providers (APPs -  Physician Assistants and Nurse Practitioners) who all  work together to provide you with the care you need, when you need it.  We recommend signing up for the patient portal called "MyChart".  Sign up information is provided on this After Visit Summary.  MyChart is used to connect with patients for Virtual Visits (Telemedicine).  Patients are able to view lab/test results, encounter notes, upcoming appointments, etc.  Non-urgent messages can be sent to your provider as well.   To learn more about what you can do with MyChart, go to [email or web address].    Your next appointment:   3 month(s)  The format for your next appointment:   In Person  Provider:   Cristopher Peru, MD    Other Instructions Thank you for choosing [facility]!    Important Information About Sugar         Signed, Erma Heritage, PA-C  02/27/2022 9:51 AM    [facility] [address] [place], [place] [address] Phone: [phone] Fax: [phone]

## 2022-02-27 ENCOUNTER — Encounter: Payer: Self-pay | Admitting: Student

## 2022-03-01 ENCOUNTER — Ambulatory Visit (INDEPENDENT_AMBULATORY_CARE_PROVIDER_SITE_OTHER): Payer: BLUE CROSS/BLUE SHIELD | Admitting: Adult Health

## 2022-03-01 ENCOUNTER — Encounter: Payer: Self-pay | Admitting: Adult Health

## 2022-03-01 VITALS — BP 110/70 | HR 77 | Temp 97.9°F | Ht 69.0 in | Wt 165.2 lb

## 2022-03-01 DIAGNOSIS — M542 Cervicalgia: Secondary | ICD-10-CM | POA: Diagnosis not present

## 2022-03-01 DIAGNOSIS — G8929 Other chronic pain: Secondary | ICD-10-CM

## 2022-03-01 DIAGNOSIS — R0683 Snoring: Secondary | ICD-10-CM

## 2022-03-01 DIAGNOSIS — F419 Anxiety disorder, unspecified: Secondary | ICD-10-CM | POA: Insufficient documentation

## 2022-03-01 NOTE — Assessment & Plan Note (Signed)
Chronic neck and back pain.  Continue follow-up with primary care.  Patient is using multiple sedating medications.  Have advised her to use caution with sedating medications

## 2022-03-01 NOTE — Assessment & Plan Note (Signed)
Underlying history of anxiety disorder, panic attacks patient is continue follow-up with primary care Suspect this is impacting her sleep as well.  Discussed a healthy sleep regimen.

## 2022-03-01 NOTE — Progress Notes (Signed)
@Patient  ID: , female    DOB: 1976-08-03, 46 y.o.   MRN: [id number]  Chief Complaint  Patient presents with   Consult    Referring provider: [id number], MD  HPI: 46 year old female seen for sleep consult Mar 01, 2022 for snoring, daytime sleepiness nocturnal gasping for air and restless sleep  TEST/EVENTS :   03/01/2022 Sleep consult  Patient presents for a sleep consult today.  Patient complains of snoring, restless sleep, daytime sleepiness.  The patient feels that she gasp for air at times.  Patient is currently being evaluated by cardiology for bradycardia.  She is also been seen by neurology for seizure like activity currently on antiseizure medicine.  Holter monitor did showed nocturnal episodes of brief second-degree Mobitz 2.  Patient has a medical history of chronic pain, fibromyalgia, anxiety.  She is now currently taking  6 Percocet daily , 2 baclofen daily  and Xanax four times daily . Cymbalta added recently   Patient quit smoking cigarettes.  Does vape.  Also uses marijuana. Patient says she has trouble falling asleep and staying asleep at times.  Typically goes to bed about 10 PM to 12 AM.  Can take as long as 2 hours to go to sleep.  Can be up anywhere between 5-10 times a night.  And typically gets out of the bed in the mornings at 10 AM to 12 PM.  She is currently unemployed.  Does not operate heavy machinery.  She has never had a previous sleep study.  Caffeine intake less than 1 soda daily  Epworth score is 5 out of 24.  Typically gets sleepy with inactivity such as sitting or reading and watching TV.  And also in the afternoon hours. Does usually nap in am , sometimes till 1 pm .  No symptoms suspicious for cataplexy or sleep paralysis Under a lot of stress with anxiety , panic attacks. Lost job couple of years ago , was unable to get to work due to pain and fatigue. Stress at home with husband. Sister died of suicide couple of years ago.     Social history patient is married.  Has 1 child.  Quit smoking November 2022.  Does use marijuana intermittently.  No alcohol use.  Does vape.   Family history positive for allergies, heart disease and cancer  Medical history significant for chronic neck pain.  Seizure-like activity, bradycardia, fibromyalgia, chronic back pain, headaches  Surgical history positive for a laminectomy with decompression in 2014  Allergies  Allergen Reactions   Cyclobenzaprine Hives and Other (See Comments)    Bad reaction     Immunization History  Administered Date(s) Administered   Janssen (J&J) SARS-COV-2 Vaccination 09/11/2020    Past Medical History:  Diagnosis Date   Anxiety    Bell's palsy    Chronic back pain    Depression    Fibromyalgia    GERD (gastroesophageal reflux disease)    Headache    History of IBS    Junctional rhythm    Second degree AV block, Mobitz type I    Sinus bradycardia    Wears glasses     Tobacco History: Social History   Tobacco Use  Smoking Status Former   Packs/day: 1.00   Years: 20.00   Pack years: 20.00   Types: Cigarettes, E-cigarettes   Quit date: 08/11/2021   Years since quitting: 0.5  Smokeless Tobacco Never  Tobacco Comments   Used to vape more often.  Vapes 2-3  times per day.   Counseling given: Not Answered Tobacco comments: Used to vape more often.  Vapes 2-3 times per day.   Outpatient Medications Prior to Visit  Medication Sig Dispense Refill   ALPRAZolam (XANAX) 1 MG tablet Take 1 mg by mouth at bedtime as needed for anxiety.     ALPRAZolam (XANAX) 1 MG tablet Take 1 mg by mouth 4 (four) times daily as needed for anxiety.     baclofen (LIORESAL) 10 MG tablet Take 10 mg by mouth 2 (two) times daily.     levETIRAcetam (KEPPRA) 500 MG tablet Take 1 and 1/2 tablets twice a day 90 tablet 11   oxyCODONE-acetaminophen (PERCOCET) 10-325 MG tablet Take 1 tablet by mouth every 8 (eight) hours as needed for pain.     pantoprazole  (PROTONIX) 40 MG tablet Take 40 mg by mouth as needed.     DULoxetine (CYMBALTA) 30 MG capsule Take 30 mg by mouth daily. (Patient not taking: Reported on 03/01/2022)     No facility-administered medications prior to visit.     Review of Systems:   Constitutional:   No  weight loss, night sweats,  Fevers, chills,  +fatigue, or  lassitude.  HEENT:   No headaches,  Difficulty swallowing,  Tooth/dental problems, or  Sore throat,                No sneezing, itching, ear ache, nasal congestion, post nasal drip,   CV:  No chest pain,  Orthopnea, PND, swelling in lower extremities, anasarca, dizziness, palpitations, syncope.   GI  No heartburn, indigestion, abdominal pain, nausea, vomiting, diarrhea, change in bowel habits, loss of appetite, bloody stools.   Resp:   No chest wall deformity  Skin: no rash or lesions.  GU: no dysuria, change in color of urine, no urgency or frequency.  No flank pain, no hematuria   MS: Chronic neck and back pain   Physical Exam  BP 110/70 (BP Location: Left Arm, Patient Position: Sitting, Cuff Size: Normal)   Pulse 77   Temp 97.9 F (36.6 C) (Oral)   Ht 5\' 9"  (1.753 m)   Wt 165 lb 3.2 oz (74.9 kg)   SpO2 98%   BMI 24.40 kg/m   GEN: A/Ox3; pleasant , NAD, well nourished    HEENT:  [place]/AT,  EACs-clear, TMs-wnl, NOSE-clear, THROAT-clear, no lesions, no postnasal drip or exudate noted.  Class II MP airway  NECK:  Supple w/ fair ROM; no JVD; normal carotid impulses w/o bruits; no thyromegaly or nodules palpated; no lymphadenopathy.    RESP  Clear  P & A; w/o, wheezes/ rales/ or rhonchi. no accessory muscle use, no dullness to percussion  CARD:  RRR, no m/r/g, no peripheral edema, pulses intact, no cyanosis or clubbing.  GI:   Soft & nt; nml bowel sounds; no organomegaly or masses detected.   Musco: Warm bil, no deformities or joint swelling noted.   Neuro: alert, no focal deficits noted.    Skin: Warm, no lesions or rashes    Lab  Results:  CBC    BNP No results found for: BNP  ProBNP No results found for: PROBNP  Imaging: No results found.        View : No data to display.          No results found for: NITRICOXIDE      Assessment & Plan:   Snoring Snoring, daytime sleepiness, restless sleep all suspicious for underlying sleep apnea.  Also suspect patient has  very fragmented sleep daytime fatigue with her underlying anxiety, panic disorder and chronic pain syndrome.  Patient is also on numerous sedating medications including Xanax, baclofen, Percocet and Keppra.  Also recently started Cymbalta. Patient education on healthy sleep regimen.  We will set patient up for an in lab sleep study.  Considering the number of sedating medications, recent seizure activity and bradycardia in lab study is indicated - discussed how weight can impact sleep and risk for sleep disordered breathing - discussed options to assist with weight loss: combination of diet modification, cardiovascular and strength training exercises   - had an extensive discussion regarding the adverse health consequences related to untreated sleep disordered breathing - specifically discussed the risks for hypertension, coronary artery disease, cardiac dysrhythmias, cerebrovascular disease, and diabetes - lifestyle modification discussed   - discussed how sleep disruption can increase risk of accidents, particularly when driving - safe driving practices were discussed    Plan  Patient Instructions  Set up for in lab sleep study-split-night sleep study Healthy sleep regimen Do not drive if sleepy Follow up in 6-8 weeks to to discuss sleep study results.     Anxiety disorder Underlying history of anxiety disorder, panic attacks patient is continue follow-up with primary care Suspect this is impacting her sleep as well.  Discussed a healthy sleep regimen.  Chronic neck pain Chronic neck and back pain.  Continue follow-up with  primary care.  Patient is using multiple sedating medications.  Have advised her to use caution with sedating medications     Rubye Oaks, NP 03/01/2022

## 2022-03-01 NOTE — Patient Instructions (Signed)
Set up for in lab sleep study-split-night sleep study Healthy sleep regimen Do not drive if sleepy Follow up in 6-8 weeks to to discuss sleep study results.

## 2022-03-01 NOTE — Assessment & Plan Note (Signed)
Snoring, daytime sleepiness, restless sleep all suspicious for underlying sleep apnea.  Also suspect patient has very fragmented sleep daytime fatigue with her underlying anxiety, panic disorder and chronic pain syndrome.  Patient is also on numerous sedating medications including Xanax, baclofen, Percocet and Keppra.  Also recently started Cymbalta. Patient education on healthy sleep regimen.  We will set patient up for an in lab sleep study.  Considering the number of sedating medications, recent seizure activity and bradycardia in lab study is indicated - discussed how weight can impact sleep and risk for sleep disordered breathing - discussed options to assist with weight loss: combination of diet modification, cardiovascular and strength training exercises   - had an extensive discussion regarding the adverse health consequences related to untreated sleep disordered breathing - specifically discussed the risks for hypertension, coronary artery disease, cardiac dysrhythmias, cerebrovascular disease, and diabetes - lifestyle modification discussed   - discussed how sleep disruption can increase risk of accidents, particularly when driving - safe driving practices were discussed    Plan  Patient Instructions  Set up for in lab sleep study-split-night sleep study Healthy sleep regimen Do not drive if sleepy Follow up in 6-8 weeks to to discuss sleep study results.

## 2022-03-02 ENCOUNTER — Ambulatory Visit: Payer: BLUE CROSS/BLUE SHIELD | Admitting: Neurology

## 2022-03-02 ENCOUNTER — Encounter: Payer: Self-pay | Admitting: Neurology

## 2022-03-02 VITALS — BP 138/91 | HR 72 | Ht 69.0 in | Wt 164.4 lb

## 2022-03-02 DIAGNOSIS — R569 Unspecified convulsions: Secondary | ICD-10-CM

## 2022-03-02 MED ORDER — LEVETIRACETAM 1000 MG PO TABS
1000.0000 mg | ORAL_TABLET | Freq: Two times a day (BID) | ORAL | 11 refills | Status: DC
Start: 2022-03-02 — End: 2022-12-10
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
## 2022-03-03 ENCOUNTER — Ambulatory Visit: Payer: Medicaid Other | Admitting: Internal Medicine

## 2022-04-02 ENCOUNTER — Telehealth: Payer: Self-pay | Admitting: Adult Health

## 2022-04-02 DIAGNOSIS — R0683 Snoring: Secondary | ICD-10-CM

## 2022-04-02 NOTE — Telephone Encounter (Signed)
Called and spoke with patient to let her know that in lab sleep study was denied by insurance and that we need to order a home sleep study instead. She expressed understanding. Orders have been changed. Nothing further needed at this time.

## 2022-04-05 ENCOUNTER — Telehealth: Payer: Self-pay | Admitting: Adult Health

## 2022-05-27 ENCOUNTER — Encounter: Payer: Self-pay | Admitting: Adult Health

## 2022-06-03 ENCOUNTER — Encounter: Payer: Self-pay | Admitting: Internal Medicine

## 2022-06-03 ENCOUNTER — Ambulatory Visit (INDEPENDENT_AMBULATORY_CARE_PROVIDER_SITE_OTHER): Payer: BLUE CROSS/BLUE SHIELD | Admitting: Internal Medicine

## 2022-06-03 VITALS — BP 128/88 | HR 74 | Ht 69.0 in | Wt 153.4 lb

## 2022-06-03 DIAGNOSIS — G909 Disorder of the autonomic nervous system, unspecified: Secondary | ICD-10-CM

## 2022-06-03 NOTE — Progress Notes (Signed)
HPI Mr. Kotz returns today for followup. She is a pleasant 46 yo woman with a h/o altered mentation who was thought to have seizures and diagnosed with seizures and placed on Keppra. 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 Since I saw her last she has had a couple of episodes where she became lightheaded, diaphoretic and clammy. She was nauseated and vomited. She began to feel better. She notes that her symptoms have worsened since she started cymbalta. She gets light headed frequently.  Allergies  Allergen Reactions   Cyclobenzaprine Hives and Other (See Comments)    Bad reaction      Current Outpatient Medications  Medication Sig Dispense Refill   ALPRAZolam (XANAX) 1 MG tablet Take 1 mg by mouth 4 (four) times daily as needed for anxiety.     baclofen (LIORESAL) 10 MG tablet Take 10 mg by mouth 2 (two) times daily.     DULoxetine (CYMBALTA) 30 MG capsule Take 30 mg by mouth 2 (two) times daily.     levETIRAcetam (KEPPRA) 1000 MG tablet Take 1 tablet (1,000 mg total) by mouth 2 (two) times daily. 60 tablet 11   oxyCODONE-acetaminophen (PERCOCET) 10-325 MG tablet Take 1 tablet by mouth every 8 (eight) hours as needed for pain.     pantoprazole (PROTONIX) 40 MG tablet Take 40 mg by mouth as needed.     No current facility-administered medications for this visit.     Past Medical History:  Diagnosis Date   Anxiety    Bell's palsy    Chronic back pain    Depression    Fibromyalgia    GERD (gastroesophageal reflux disease)    Headache    History of IBS    Junctional rhythm    Second degree AV block, Mobitz type I    Sinus bradycardia    Wears glasses     ROS:   All systems reviewed and negative except as noted in the HPI.   Past Surgical History:  Procedure Laterality Date   DILATION AND CURETTAGE OF UTERUS     LUMBAR LAMINECTOMY/DECOMPRESSION MICRODISCECTOMY Bilateral 03/30/2013   Procedure: LUMBAR LAMINECTOMY/DECOMPRESSION  MICRODISCECTOMY 1 LEVEL;  Surgeon: Hewitt Shorts, MD;  Location: [facility];  Service: Neurosurgery;  Laterality: Bilateral;  bilateral L45 laminotomy and microdiskectomy   TOOTH EXTRACTION       Family History  Problem Relation Age of Onset   Cancer Father    Alcoholism Father    Diabetes Sister      Social History   Socioeconomic History   Marital status: Married    Spouse name: Not on file   Number of children: Not on file   Years of education: Not on file   Highest education level: Not on file  Occupational History   Not on file  Tobacco Use   Smoking status: Former    Packs/day: 1.00    Years: 20.00    Total pack years: 20.00    Types: Cigarettes, E-cigarettes    Quit date: 08/11/2021    Years since quitting: 0.8   Smokeless tobacco: Never   Tobacco comments:    Used to vape more often.  Vapes 2-3 times per day.  Vaping Use   Vaping Use: Every day  Substance and Sexual Activity   Alcohol use: Not Currently    Comment: occasional   Drug use: Yes    Types: Marijuana   Sexual activity: Not Currently  Other  Topics Concern   Not on file  Social History Narrative   Right handed    Social Determinants of Health   Financial Resource Strain: Not on file  Food Insecurity: Not on file  Transportation Needs: Not on file  Physical Activity: Not on file  Stress: Not on file  Social Connections: Not on file  Intimate Partner Violence: Not on file     BP 128/88   Pulse 74   Ht 5\' 9"  (1.753 m)   Wt 153 lb 6.4 oz (69.6 kg)   LMP 06/03/2022   SpO2 99%   BMI 22.65 kg/m   Physical Exam:  Well appearing NAD HEENT: Unremarkable Neck:  No JVD, no thyromegally Lymphatics:  No adenopathy Back:  No CVA tenderness Lungs:  Clear with no wheezes HEART:  Regular rate rhythm, no murmurs, no rubs, no clicks Abd:  soft, positive bowel sounds, no organomegally, no rebound, no guarding Ext:  2 plus pulses, no edema, no cyanosis, no clubbing Skin:  No rashes no  nodules Neuro:  CN II through XII intact, motor grossly intact  EKG - nsr  Assess/Plan:  Autonomic dysfunction - I discussed the importance of increasing salt and fluid. We discussed adding additional meds to increase her bp. Midodrine or florinef would be a consideration. However for now we will hold and see how she does. If her symptoms worsen, I would first try midodrine 5 mg twice daily. AVWB - of no clinical significance at this point.   06/05/2022 Breyana Follansbee,MD

## 2022-06-03 NOTE — Patient Instructions (Signed)
Medication Instructions:  Your physician recommends that you continue on your current medications as directed. Please refer to the Current Medication list given to you today.  *If you need a refill on your cardiac medications before your next appointment, please call your pharmacy*   Lab Work: NONE   If you have labs (blood work) drawn today and your tests are completely normal, you will receive your results only by: MyChart Message (if you have MyChart) OR A paper copy in the mail If you have any lab test that is abnormal or we need to change your treatment, we will call you to review the results.   Testing/Procedures: NONE    Follow-Up: At [facility], you and your health needs are our priority.  As part of our continuing mission to provide you with exceptional heart care, we have created designated Provider Care Teams.  These Care Teams include your primary Cardiologist (physician) and Advanced Practice Providers (APPs -  Physician Assistants and Nurse Practitioners) who all work together to provide you with the care you need, when you need it.  We recommend signing up for the patient portal called "MyChart".  Sign up information is provided on this After Visit Summary.  MyChart is used to connect with patients for Virtual Visits (Telemedicine).  Patients are able to view lab/test results, encounter notes, upcoming appointments, etc.  Non-urgent messages can be sent to your provider as well.   To learn more about what you can do with MyChart, go to [email or web address].    Your next appointment:   6 month(s)  The format for your next appointment:   In Person  Provider:   Gregg Taylor, MD    Other Instructions Thank you for choosing [facility]!    Important Information About Sugar       

## 2022-08-03 ENCOUNTER — Telehealth: Payer: Self-pay | Admitting: Anesthesiology

## 2022-08-03 NOTE — Telephone Encounter (Signed)
Patient requesting a call back regarding the EEG she was supposed to have done.

## 2022-08-03 NOTE — Telephone Encounter (Signed)
Patient is suffering from new symptoms and is due an appointment for 6 months but not scheduled Patient having same episodes but has been passing out 3-4 times a day and not being able to communicate . Keppra making her feel angrier and she has been acting out more and losing her temper. Patient diagnosed with an auto immune dysfunction. Patient concerned about the changes in her episodes and doesn't have the money at this time for the EEG it is over 2,000$ at this time

## 2022-08-04 NOTE — Telephone Encounter (Signed)
Schedule for earlier appt

## 2022-08-06 ENCOUNTER — Encounter: Payer: Self-pay | Admitting: Neurology

## 2022-08-06 ENCOUNTER — Telehealth (INDEPENDENT_AMBULATORY_CARE_PROVIDER_SITE_OTHER): Payer: BLUE CROSS/BLUE SHIELD | Admitting: Neurology

## 2022-08-06 VITALS — Ht 69.0 in | Wt 160.0 lb

## 2022-08-06 DIAGNOSIS — R569 Unspecified convulsions: Secondary | ICD-10-CM

## 2022-08-06 MED ORDER — OXCARBAZEPINE 300 MG PO TABS: ORAL_TABLET | ORAL | 6 refills | Status: DC

## 2022-08-06 NOTE — Patient Instructions (Signed)
Good to see you.  Start Oxcarbazepine (Trileptal) 300mg : Take 1/2 tablet twice a day for 1 week, then increase to 1 tablet twice a day for 1 week, then increase to 2 tablets twice a day and continue  2. Continue Keppra 1000mg  twice a day for another 3 weeks. Once you are on the maintenance dose of the new medication Oxcarbazepine, reduce Keppra: take 1/2 tablet in AM, 1 tablet in PM for 1 week, then 1/2 tablet twice a day for 1 week, then 1/2 tablet every night for 1 week, then stop  3. Continue to keep a calendar of your symptoms. We may do inpatient video EEG monitoring if symptoms continue  4. Follow-up in 3 months, call for any changes
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
## 2022-10-15 ENCOUNTER — Encounter: Payer: Self-pay | Admitting: Neurology

## 2022-10-25 ENCOUNTER — Encounter: Payer: Self-pay | Admitting: Neurology

## 2022-11-16 ENCOUNTER — Ambulatory Visit: Payer: BLUE CROSS/BLUE SHIELD | Admitting: Neurology

## 2022-11-29 ENCOUNTER — Ambulatory Visit: Payer: BLUE CROSS/BLUE SHIELD | Admitting: Neurology

## 2022-12-06 ENCOUNTER — Emergency Department (HOSPITAL_COMMUNITY): Payer: BLUE CROSS/BLUE SHIELD

## 2022-12-06 ENCOUNTER — Inpatient Hospital Stay (HOSPITAL_COMMUNITY)
Admission: EM | Admit: 2022-12-06 | Discharge: 2022-12-10 | DRG: 101 | Disposition: A | Discharge status: Home / Self Care | Payer: BLUE CROSS/BLUE SHIELD | Attending: Family Medicine | Admitting: Family Medicine

## 2022-12-06 ENCOUNTER — Encounter (HOSPITAL_COMMUNITY): Payer: Self-pay | Admitting: *Deleted

## 2022-12-06 ENCOUNTER — Other Ambulatory Visit: Payer: Self-pay

## 2022-12-06 DIAGNOSIS — G909 Disorder of the autonomic nervous system, unspecified: Secondary | ICD-10-CM | POA: Diagnosis present

## 2022-12-06 DIAGNOSIS — G894 Chronic pain syndrome: Secondary | ICD-10-CM | POA: Diagnosis present

## 2022-12-06 DIAGNOSIS — Z91128 Patient's intentional underdosing of medication regimen for other reason: Secondary | ICD-10-CM

## 2022-12-06 DIAGNOSIS — F32A Depression, unspecified: Secondary | ICD-10-CM | POA: Diagnosis present

## 2022-12-06 DIAGNOSIS — D72828 Other elevated white blood cell count: Secondary | ICD-10-CM | POA: Diagnosis present

## 2022-12-06 DIAGNOSIS — Z87891 Personal history of nicotine dependence: Secondary | ICD-10-CM

## 2022-12-06 DIAGNOSIS — M797 Fibromyalgia: Secondary | ICD-10-CM | POA: Diagnosis present

## 2022-12-06 DIAGNOSIS — Z91199 Patient's noncompliance with other medical treatment and regimen due to unspecified reason: Secondary | ICD-10-CM

## 2022-12-06 DIAGNOSIS — R569 Unspecified convulsions: Secondary | ICD-10-CM | POA: Diagnosis not present

## 2022-12-06 DIAGNOSIS — Z833 Family history of diabetes mellitus: Secondary | ICD-10-CM

## 2022-12-06 DIAGNOSIS — F1290 Cannabis use, unspecified, uncomplicated: Secondary | ICD-10-CM | POA: Diagnosis present

## 2022-12-06 DIAGNOSIS — Z79899 Other long term (current) drug therapy: Secondary | ICD-10-CM

## 2022-12-06 DIAGNOSIS — E876 Hypokalemia: Secondary | ICD-10-CM | POA: Diagnosis not present

## 2022-12-06 DIAGNOSIS — Z6372 Alcoholism and drug addiction in family: Secondary | ICD-10-CM

## 2022-12-06 DIAGNOSIS — N3289 Other specified disorders of bladder: Secondary | ICD-10-CM | POA: Diagnosis present

## 2022-12-06 DIAGNOSIS — R001 Bradycardia, unspecified: Secondary | ICD-10-CM | POA: Diagnosis present

## 2022-12-06 DIAGNOSIS — R4701 Aphasia: Secondary | ICD-10-CM | POA: Diagnosis present

## 2022-12-06 DIAGNOSIS — Z811 Family history of alcohol abuse and dependence: Secondary | ICD-10-CM

## 2022-12-06 DIAGNOSIS — K219 Gastro-esophageal reflux disease without esophagitis: Secondary | ICD-10-CM | POA: Diagnosis present

## 2022-12-06 DIAGNOSIS — G9340 Encephalopathy, unspecified: Secondary | ICD-10-CM | POA: Diagnosis not present

## 2022-12-06 DIAGNOSIS — T4276XA Underdosing of unspecified antiepileptic and sedative-hypnotic drugs, initial encounter: Secondary | ICD-10-CM | POA: Diagnosis present

## 2022-12-06 DIAGNOSIS — I700 Atherosclerosis of aorta: Secondary | ICD-10-CM | POA: Diagnosis present

## 2022-12-06 DIAGNOSIS — G9341 Metabolic encephalopathy: Secondary | ICD-10-CM | POA: Diagnosis present

## 2022-12-06 DIAGNOSIS — I441 Atrioventricular block, second degree: Secondary | ICD-10-CM | POA: Diagnosis present

## 2022-12-06 DIAGNOSIS — G40909 Epilepsy, unspecified, not intractable, without status epilepticus: Secondary | ICD-10-CM | POA: Diagnosis not present

## 2022-12-06 DIAGNOSIS — Z888 Allergy status to other drugs, medicaments and biological substances status: Secondary | ICD-10-CM

## 2022-12-06 DIAGNOSIS — M549 Dorsalgia, unspecified: Secondary | ICD-10-CM | POA: Diagnosis present

## 2022-12-06 DIAGNOSIS — R1310 Dysphagia, unspecified: Secondary | ICD-10-CM | POA: Diagnosis present

## 2022-12-06 DIAGNOSIS — F061 Catatonic disorder due to known physiological condition: Secondary | ICD-10-CM | POA: Diagnosis present

## 2022-12-06 HISTORY — DX: Unspecified convulsions: R56.9

## 2022-12-06 LAB — CBC WITH DIFFERENTIAL/PLATELET
Abs Immature Granulocytes: 0.1 10*3/uL — ABNORMAL HIGH (ref 0.00–0.07)
Basophils Absolute: 0 10*3/uL (ref 0.0–0.1)
Basophils Relative: 0 %
Eosinophils Absolute: 0 10*3/uL (ref 0.0–0.5)
Eosinophils Relative: 0 %
HCT: 39.5 % (ref 36.0–46.0)
Hemoglobin: 13.7 g/dL (ref 12.0–15.0)
Immature Granulocytes: 1 %
Lymphocytes Relative: 5 %
Lymphs Abs: 1 10*3/uL (ref 0.7–4.0)
MCH: 31.9 pg (ref 26.0–34.0)
MCHC: 34.7 g/dL (ref 30.0–36.0)
MCV: 92.1 fL (ref 80.0–100.0)
Monocytes Absolute: 0.9 10*3/uL (ref 0.1–1.0)
Monocytes Relative: 4 %
Neutro Abs: 17.9 10*3/uL — ABNORMAL HIGH (ref 1.7–7.7)
Neutrophils Relative %: 90 %
Platelets: 405 10*3/uL — ABNORMAL HIGH (ref 150–400)
RBC: 4.29 MIL/uL (ref 3.87–5.11)
RDW: 13.4 % (ref 11.5–15.5)
WBC: 19.9 10*3/uL — ABNORMAL HIGH (ref 4.0–10.5)
nRBC: 0 % (ref 0.0–0.2)

## 2022-12-06 LAB — COMPREHENSIVE METABOLIC PANEL
ALT: 20 U/L (ref 0–44)
AST: 25 U/L (ref 15–41)
Albumin: 5.2 g/dL — ABNORMAL HIGH (ref 3.5–5.0)
Alkaline Phosphatase: 79 U/L (ref 38–126)
Anion gap: 14 (ref 5–15)
BUN: 17 mg/dL (ref 6–20)
CO2: 25 mmol/L (ref 22–32)
Calcium: 10.1 mg/dL (ref 8.9–10.3)
Chloride: 102 mmol/L (ref 98–111)
Creatinine, Ser: 0.96 mg/dL (ref 0.44–1.00)
GFR, Estimated: >60 mL/min (ref >60)
Glucose, Bld: 117 mg/dL — ABNORMAL HIGH (ref 70–99)
Potassium: 3.5 mmol/L (ref 3.5–5.1)
Sodium: 141 mmol/L (ref 135–145)
Total Bilirubin: 1 mg/dL (ref 0.3–1.2)
Total Protein: 8.8 g/dL — ABNORMAL HIGH (ref 6.5–8.1)

## 2022-12-06 LAB — URINALYSIS, ROUTINE W REFLEX MICROSCOPIC
Bilirubin Urine: NEGATIVE
Glucose, UA: NEGATIVE mg/dL
Hgb urine dipstick: NEGATIVE
Ketones, ur: 20 mg/dL — AB
Leukocytes,Ua: NEGATIVE
Nitrite: NEGATIVE
Protein, ur: 30 mg/dL — AB
Specific Gravity, Urine: 1.018 (ref 1.005–1.030)
pH: 5 (ref 5.0–8.0)

## 2022-12-06 LAB — RAPID URINE DRUG SCREEN, HOSP PERFORMED
Amphetamines: NOT DETECTED
Barbiturates: NOT DETECTED
Benzodiazepines: POSITIVE — AB
Cocaine: NOT DETECTED
Opiates: POSITIVE — AB
Tetrahydrocannabinol: POSITIVE — AB

## 2022-12-06 LAB — ETHANOL: Alcohol, Ethyl (B): <10 mg/dL (ref <10)

## 2022-12-06 LAB — PROTIME-INR
INR: 1 (ref 0.8–1.2)
Prothrombin Time: 13.1 seconds (ref 11.4–15.2)

## 2022-12-06 LAB — CBG MONITORING, ED: Glucose-Capillary: 128 mg/dL — ABNORMAL HIGH (ref 70–99)

## 2022-12-06 LAB — MAGNESIUM: Magnesium: 2.7 mg/dL — ABNORMAL HIGH (ref 1.7–2.4)

## 2022-12-06 MED ORDER — SODIUM CHLORIDE 0.9 % IV SOLN
1.0000 g | Freq: Once | INTRAVENOUS | Status: AC
  Administered 2022-12-06: 1 g via INTRAVENOUS
  Filled 2022-12-06: qty 10

## 2022-12-06 MED ORDER — ONDANSETRON HCL 4 MG PO TABS: 4.0000 mg | ORAL_TABLET | Freq: Four times a day (QID) | ORAL | Status: DC | PRN

## 2022-12-06 MED ORDER — ACETAMINOPHEN 650 MG RE SUPP: 650.0000 mg | Freq: Four times a day (QID) | RECTAL | Status: DC | PRN

## 2022-12-06 MED ORDER — ENOXAPARIN SODIUM 40 MG/0.4ML IJ SOSY
40.0000 mg | PREFILLED_SYRINGE | INTRAMUSCULAR | Status: DC
  Administered 2022-12-07 – 2022-12-09 (×3): 40 mg via SUBCUTANEOUS
  Filled 2022-12-06 (×4): qty 0.4

## 2022-12-06 MED ORDER — LORAZEPAM 2 MG/ML IJ SOLN
1.0000 mg | Freq: Once | INTRAMUSCULAR | Status: AC
  Administered 2022-12-06: 1 mg via INTRAVENOUS
  Filled 2022-12-06: qty 1

## 2022-12-06 MED ORDER — OXCARBAZEPINE 300 MG PO TABS
600.0000 mg | ORAL_TABLET | Freq: Two times a day (BID) | ORAL | Status: DC
  Administered 2022-12-07 – 2022-12-10 (×6): 600 mg via ORAL
  Filled 2022-12-06 (×7): qty 2

## 2022-12-06 MED ORDER — LACTATED RINGERS IV SOLN: INTRAVENOUS | Status: AC

## 2022-12-06 MED ORDER — DULOXETINE HCL 30 MG PO CPEP
30.0000 mg | ORAL_CAPSULE | Freq: Every day | ORAL | Status: DC
  Administered 2022-12-07 – 2022-12-10 (×3): 30 mg via ORAL
  Filled 2022-12-06 (×4): qty 1

## 2022-12-06 MED ORDER — ONDANSETRON HCL 4 MG/2ML IJ SOLN: 4.0000 mg | Freq: Four times a day (QID) | INTRAMUSCULAR | Status: DC | PRN

## 2022-12-06 MED ORDER — ACETAMINOPHEN 325 MG PO TABS: 650.0000 mg | ORAL_TABLET | Freq: Four times a day (QID) | ORAL | Status: DC | PRN

## 2022-12-06 MED ORDER — ONDANSETRON HCL 4 MG/2ML IJ SOLN
4.0000 mg | Freq: Once | INTRAMUSCULAR | Status: AC
  Administered 2022-12-06: 4 mg via INTRAVENOUS
  Filled 2022-12-06: qty 2

## 2022-12-06 MED ORDER — SODIUM CHLORIDE 0.9% FLUSH
3.0000 mL | Freq: Two times a day (BID) | INTRAVENOUS | Status: DC
  Administered 2022-12-07 – 2022-12-10 (×5): 3 mL via INTRAVENOUS

## 2022-12-06 MED ORDER — POLYETHYLENE GLYCOL 3350 17 G PO PACK: 17.0000 g | PACK | Freq: Every day | ORAL | Status: DC | PRN

## 2022-12-06 MED ORDER — SODIUM CHLORIDE 0.9 % IV BOLUS
1000.0000 mL | Freq: Once | INTRAVENOUS | Status: AC
  Administered 2022-12-06: 1000 mL via INTRAVENOUS

## 2022-12-06 NOTE — H&P (Signed)
History and Physical    Mckenzie Baker [id number] DOB: 1975-10-19 DOA: 12/06/2022  PCP: Lemmie Evens, MD   Patient coming from: Home   Chief Complaint: AMS   HPI: Mckenzie Baker is a pleasant 47 y.o. female with medical history significant for seizures, autonomic dysfunction, and chronic pain who presents to the emergency department with altered mental status.  ***   ED Course: Upon arrival to the ED, patient is found to be afebrile and saturating well on room air with bradycardia and stable blood pressure.  EKG demonstrates sinus bradycardia.  Head CT is negative for acute intracranial abnormality.  Labs are most notable for WBC 19,900 and platelets 405,000.  Patient was treated in the emergency department with 1 L of normal saline, Zofran, Ativan, and Rocephin.  Review of Systems:  All other systems reviewed and apart from HPI, are negative.  Past Medical History:  Diagnosis Date   Anxiety    Bell's palsy    Chronic back pain    Depression    Fibromyalgia    GERD (gastroesophageal reflux disease)    Headache    History of IBS    Junctional rhythm    Second degree AV block, Mobitz type I    Seizures (HCC)    Sinus bradycardia    Wears glasses     Past Surgical History:  Procedure Laterality Date   DILATION AND CURETTAGE OF UTERUS     LUMBAR LAMINECTOMY/DECOMPRESSION MICRODISCECTOMY Bilateral 03/30/2013   Procedure: LUMBAR LAMINECTOMY/DECOMPRESSION MICRODISCECTOMY 1 LEVEL;  Surgeon: Hosie Spangle, MD;  Location: [facility];  Service: Neurosurgery;  Laterality: Bilateral;  bilateral L45 laminotomy and microdiskectomy   TOOTH EXTRACTION      Social History:   reports that she quit smoking about 15 months ago. Her smoking use included cigarettes and e-cigarettes. She has a 20.00 pack-year smoking history. She has never used smokeless tobacco. She reports that she does not currently use alcohol. She reports current drug use. Drug: Marijuana.  Allergies   Allergen Reactions   Cyclobenzaprine Hives and Other (See Comments)    Bad reaction     Family History  Problem Relation Age of Onset   Cancer Father    Alcoholism Father    Diabetes Sister      Prior to Admission medications   Medication Sig Start Date End Date Taking? Authorizing Provider  ALPRAZolam Duanne Moron) 1 MG tablet Take 1 mg by mouth 4 (four) times daily as needed for anxiety.    [provider]  baclofen (LIORESAL) 10 MG tablet Take 10 mg by mouth 2 (two) times daily.    [provider]  DULoxetine (CYMBALTA) 30 MG capsule Take 30 mg by mouth daily. 02/24/22   [provider]  levETIRAcetam (KEPPRA) 1000 MG tablet Take 1 tablet (1,000 mg total) by mouth 2 (two) times daily. 03/02/22   Cameron Sprang, MD  Oxcarbazepine (TRILEPTAL) 300 MG tablet Take 1/2 tablet twice a day for 1 week, then increase to 1 tablet twice a day for 1 week, then 2 tablets twice a day and continue 08/06/22   Cameron Sprang, MD  oxyCODONE-acetaminophen (PERCOCET) 10-325 MG tablet Take 1 tablet by mouth every 8 (eight) hours as needed for pain.    [provider]  pantoprazole (PROTONIX) 40 MG tablet Take 40 mg by mouth as needed.    [provider]    Physical Exam: Vitals:   12/06/22 2000 12/06/22 2030 12/06/22 2045 12/06/22 2100  BP: Marland Kitchen)  146/79 (!) 147/76  (!) 146/81  Pulse: (!) 42 (!) 50  (!) 46  Resp: '13  13 13  '$ Temp:      TempSrc:      SpO2: 100% 100% 100% 100%     Constitutional: NAD, calm  Eyes: PERTLA, lids and conjunctivae normal ENMT: Mucous membranes are moist. Posterior pharynx clear of any exudate or lesions.   Neck: supple, no masses  Respiratory: clear to auscultation bilaterally, no wheezing, no crackles. No accessory muscle use.  Cardiovascular: S1 & S2 heard, regular rate and rhythm. No extremity edema. No significant JVD. Abdomen: No distension, no tenderness, soft. Bowel sounds active.  Musculoskeletal: no clubbing / cyanosis.  No joint deformity upper and lower extremities.   Skin: no significant rashes, lesions, ulcers. Warm, dry, well-perfused. Neurologic: CN 2-12 grossly intact. Sensation intact, DTR normal. Strength 5/5 in all 4 limbs. Alert and oriented.  Psychiatric: Pleasant. Cooperative.    Labs and Imaging on Admission: I have personally reviewed following labs and imaging studies  CBC: Recent Labs  Lab 12/06/22 1854  WBC 19.9*  NEUTROABS 17.9*  HGB 13.7  HCT 39.5  MCV 92.1  PLT 123456*   Basic Metabolic Panel: Recent Labs  Lab 12/06/22 1854  NA 141  K 3.5  CL 102  CO2 25  GLUCOSE 117*  BUN 17  CREATININE 0.96  CALCIUM 10.1  MG 2.7*   GFR: CrCl cannot be calculated (Unknown ideal weight.). Liver Function Tests: Recent Labs  Lab 12/06/22 1854  AST 25  ALT 20  ALKPHOS 79  BILITOT 1.0  PROT 8.8*  ALBUMIN 5.2*   No results for input(s): "LIPASE", "AMYLASE" in the last 168 hours. No results for input(s): "AMMONIA" in the last 168 hours. Coagulation Profile: Recent Labs  Lab 12/06/22 1854  INR 1.0   Cardiac Enzymes: No results for input(s): "CKTOTAL", "CKMB", "CKMBINDEX", "TROPONINI" in the last 168 hours. BNP (last 3 results) No results for input(s): "PROBNP" in the last 8760 hours. HbA1C: No results for input(s): "HGBA1C" in the last 72 hours. CBG: Recent Labs  Lab 12/06/22 1810  GLUCAP 128*   Lipid Profile: No results for input(s): "CHOL", "HDL", "LDLCALC", "TRIG", "CHOLHDL", "LDLDIRECT" in the last 72 hours. Thyroid Function Tests: No results for input(s): "TSH", "T4TOTAL", "FREET4", "T3FREE", "THYROIDAB" in the last 72 hours. Anemia Panel: No results for input(s): "VITAMINB12", "FOLATE", "FERRITIN", "TIBC", "IRON", "RETICCTPCT" in the last 72 hours. Urine analysis:    Component Value Date/Time   COLORURINE YELLOW 12/06/2022 2132   APPEARANCEUR CLEAR 12/06/2022 2132   LABSPEC 1.018 12/06/2022 2132   PHURINE 5.0 12/06/2022 2132   GLUCOSEU NEGATIVE 12/06/2022  2132   HGBUR NEGATIVE 12/06/2022 2132   BILIRUBINUR NEGATIVE 12/06/2022 2132   KETONESUR 20 (A) 12/06/2022 2132   PROTEINUR 30 (A) 12/06/2022 2132   NITRITE NEGATIVE 12/06/2022 2132   LEUKOCYTESUR NEGATIVE 12/06/2022 2132   Sepsis Labs: '@LABRCNTIP'$ (procalcitonin:4,lacticidven:4) )No results found for this or any previous visit (from the past 240 hour(s)).   Radiological Exams on Admission: CT Head Wo Contrast  Result Date: 12/06/2022 CLINICAL DATA:  Altered mental status EXAM: CT HEAD WITHOUT CONTRAST TECHNIQUE: Contiguous axial images were obtained from the base of the skull through the vertex without intravenous contrast. RADIATION DOSE REDUCTION: This exam was performed according to the departmental dose-optimization program which includes automated exposure control, adjustment of the mA and/or kV according to patient size and/or use of iterative reconstruction technique. COMPARISON:  06/09/2021 head CT FINDINGS: Brain: Preserved brain volume and gray-white  matter differentiation. No midline shift, mass effect or hydrocephalus. No abnormal intra or extra-axial fluid collections are identified. Vascular: No hyperdense vessel or unexpected calcification. Skull: Lung windows do not demonstrate a displaced or depressed skull fracture. Sinuses/Orbits: Visualized paranasal sinuses and mastoid air cells are clear. Globes are intact. Other: None. IMPRESSION: No acute intracranial abnormality. Electronically Signed   By: Jill Side M.D.   On: 12/06/2022 19:30    EKG: Independently reviewed. Sinus bradycardia, rate 41.   Assessment/Plan   1. Acute encephalopathy  - ***  -  -  -  -  -  -  -   2. Seizures  - ***  -  -  -  -  -  -   3. Autonomic dysfunction; second degree AV block Mobitz type I  - ***  -  -  -  -  -  -   4. Leukocytosis  - ***  -  -  -  -  -  -  -    DVT prophylaxis: Lovenox  Code Status: Full  Level of Care: Level of care: Telemetry Family  Communication: ***  Disposition Plan:  Patient is from: home  Anticipated d/c is to: Home  Anticipated d/c date is: 2/27 or 12/08/22  Patient currently: Pending EEG, improved mental status  Consults called: none  Admission status: Observation     Vianne Bulls, MD [facility]  12/06/2022, 10:47 PM

## 2022-12-06 NOTE — ED Notes (Signed)
Family in room. Reminded of urine sample. Will cath if urine not provided in a few min.

## 2022-12-06 NOTE — ED Notes (Signed)
Seizure pads applied to stretcher

## 2022-12-06 NOTE — ED Triage Notes (Signed)
Pt arrived via ems for ams; family reports pt has been vomiting and not acting herself; pt is epileptic and has not had her medications for the last few days  Pt is clammy with "goosebumps" all over arms  Pt will follow commands but is not verbal

## 2022-12-06 NOTE — ED Notes (Signed)
Pt alert to person and place

## 2022-12-06 NOTE — ED Provider Notes (Signed)
[facility] Provider Note   CSN: [id number] Arrival date & time: 12/06/22  1733     History  Chief Complaint  Patient presents with   Altered Mental Status    SAMHITA CHAMPEAU is a 47 y.o. female.   Altered Mental Status  This patient is a 47 year old female, she has a history of substance abuse according to the daughter who is at the bedside and is the primary historian.  The patient has a history of epilepsy which was diagnosed in the last couple of years, follows with Dr. Delice Lesch and was recently switched from [place] to oxcarbazepine.  Evidently the patient lives in [place] and has been here for the last 3 days, out of her medications, went to get a refill which she picked up today but never took any medicine.  Per report of the family member she has been at home has not gotten out of bed in many hours, has been confused and not answering questions, seems to be spacing off.  Per report of the patient's seizure history includes some tonic-clonic activity as well as some abnormal sounds of the mouth and then being persistently confused afterwards.  It is unclear whether any of that happened that she was not witnessed to have any seizure-like activity today she has been altered for the better part of the day and nobody saw any seizure activity.  The patient will not answer any questions, level 5 caveat applies    Home Medications Prior to Admission medications   Medication Sig Start Date End Date Taking? Authorizing Provider  ALPRAZolam Duanne Moron) 1 MG tablet Take 1 mg by mouth 4 (four) times daily as needed for anxiety.    [provider]  baclofen (LIORESAL) 10 MG tablet Take 10 mg by mouth 2 (two) times daily.    [provider]  DULoxetine (CYMBALTA) 30 MG capsule Take 30 mg by mouth daily. 02/24/22   [provider]  levETIRAcetam (KEPPRA) 1000 MG tablet Take 1 tablet (1,000 mg total) by mouth 2 (two) times daily.  03/02/22   Cameron Sprang, MD  Oxcarbazepine (TRILEPTAL) 300 MG tablet Take 1/2 tablet twice a day for 1 week, then increase to 1 tablet twice a day for 1 week, then 2 tablets twice a day and continue 08/06/22   Cameron Sprang, MD  oxyCODONE-acetaminophen (PERCOCET) 10-325 MG tablet Take 1 tablet by mouth every 8 (eight) hours as needed for pain.    [provider]  pantoprazole (PROTONIX) 40 MG tablet Take 40 mg by mouth as needed.    [provider]      Allergies    Cyclobenzaprine    Review of Systems   Review of Systems  Unable to perform ROS: Acuity of condition    Physical Exam Updated Vital Signs BP 127/61 (BP Location: Right Arm)   Pulse (!) 40   Temp 97.7 F (36.5 C) (Oral)   Resp 14   SpO2 99%  Physical Exam Vitals and nursing note reviewed.  Constitutional:      General: She is in acute distress.     Appearance: She is well-developed.  HENT:     Head: Normocephalic and atraumatic.     Nose: Nose normal.     Mouth/Throat:     Pharynx: No oropharyngeal exudate.     Comments: Dentition intact, no obvious tongue biting Eyes:     General: No scleral icterus.       Right  eye: No discharge.        Left eye: No discharge.     Conjunctiva/sclera: Conjunctivae normal.     Pupils: Pupils are equal, round, and reactive to light.  Neck:     Thyroid: No thyromegaly.     Vascular: No JVD.  Cardiovascular:     Rate and Rhythm: Regular rhythm. Bradycardia present.     Heart sounds: Normal heart sounds. No murmur heard.    No friction rub. No gallop.     Comments: Heart rate of 45 bpm, normal pulses Pulmonary:     Effort: Pulmonary effort is normal. No respiratory distress.     Breath sounds: Normal breath sounds. No wheezing or rales.  Abdominal:     General: Bowel sounds are normal. There is no distension.     Palpations: Abdomen is soft. There is no mass.     Tenderness: There is no abdominal tenderness.  Musculoskeletal:        General: No  tenderness. Normal range of motion.     Cervical back: Normal range of motion and neck supple.     Right lower leg: No edema.     Left lower leg: No edema.  Lymphadenopathy:     Cervical: No cervical adenopathy.  Skin:    General: Skin is warm and dry.     Findings: No erythema or rash.  Neurological:     Mental Status: She is alert.     Coordination: Coordination normal.     Comments: The patient lays in the bed, eyes are open, she does not pay attention to what is going on around her.  When I ask her to do things she moves very slowly, she is able to lift her left arm and her right arm when asked with significant assistance.  When I cause a painful stimuli she is able to move all 4 extremities with normal strength and will glare at me and wince in pain, then she will go back to being more somnolent  Psychiatric:        Behavior: Behavior normal.     ED Results / Procedures / Treatments   Labs (all labs ordered are listed, but only abnormal results are displayed) Labs Reviewed  CBG MONITORING, ED - Abnormal; Notable for the following components:      Result Value   Glucose-Capillary 128 (*)    All other components within normal limits  CBC WITH DIFFERENTIAL/PLATELET  COMPREHENSIVE METABOLIC PANEL  RAPID URINE DRUG SCREEN, HOSP PERFORMED  URINALYSIS, ROUTINE W REFLEX MICROSCOPIC  ETHANOL  PROTIME-INR  CBG MONITORING, ED    EKG None  Radiology No results found.  Procedures Procedures    Medications Ordered in ED Medications  LORazepam (ATIVAN) injection 1 mg (has no administration in time range)    ED Course/ Medical Decision Making/ A&P                             Medical Decision Making Amount and/or Complexity of Data Reviewed Labs: ordered. Radiology: ordered.  Risk Prescription drug management. Decision regarding hospitalization.   This patient presents to the ED for concern of altered mental status, this involves an extensive number of treatment  options, and is a complaint that carries with it a high risk of complications and morbidity.  The differential diagnosis includes seizure, hypoglycemia, drug overdose, psychiatric nonepileptic seizures, panic attack, anxiety attack   Co morbidities that complicate the patient evaluation  History  of seizures   Additional history obtained:  Additional history obtained from electronic medical record External records from outside source obtained and reviewed including records from the neurology office, the patient was originally admitted in August 2022 after having a loss of consciousness.  She had some seizure activity during that episode.  Had some transient bradycardia at that time.  Had a normal echocardiogram, sinus bradycardia seen on cardiac monitoring afterwards.  During that hospitalization in October 2022 the patient had a normal EEG, and MRI of the brain noted a slight asymmetry in the prominence of the temporal horn on the left but no other significant abnormalities.  She was started on Keppra, the patient did not tolerate this making it feel like she was having angry episodes.  She was switched over to oxcarbazepine at her most recent video visit in October 2023.   Lab Tests:  I Ordered, and personally interpreted labs.  The pertinent results include: Drug screen positive for benzodiazepines opiates and marijuana, urinalysis with some ketones but no signs of infection, leukocytosis of unknown source at [id number], metabolic panel unremarkable, alcohol negative, magnesium high   Imaging Studies ordered:  I ordered imaging studies including CT scan of the head I independently visualized and interpreted imaging which showed no acute findings I agree with the radiologist interpretation   Cardiac Monitoring: / EKG:  The patient was maintained on a cardiac monitor.  I personally viewed and interpreted the cardiac monitored which showed an underlying rhythm of: Sinus  bradycardia   Consultations Obtained:  I requested consultation with the hospitalist,  and discussed lab and imaging findings as well as pertinent plan - they recommend: Admission   Problem List / ED Course / Critical interventions / Medication management  This patient did not really improve while in the emergency department, there was a slight improvement in the mental status and she was able to finally answer questions with a very soft voice but extremely weak.  She had vomited and remained mildly bradycardic.  It is unclear whether she has had some type of overdose or medication side effect or possibly postictal from a seizure but due to the extended symptoms she will be admitted for observation.  I appreciate the hospitalist for admitting this very sick patient I have reviewed the patients home medicines and have made adjustments as needed   Social Determinants of Health:  Substance abuse   Test / Admission - Considered:  Will admit to the hospital         Final Clinical Impression(s) / ED Diagnoses Final diagnoses:  Acute encephalopathy     Noemi Chapel, MD 12/06/22 2338

## 2022-12-07 ENCOUNTER — Ambulatory Visit: Payer: BLUE CROSS/BLUE SHIELD | Admitting: Internal Medicine

## 2022-12-07 ENCOUNTER — Encounter (HOSPITAL_COMMUNITY): Payer: Self-pay | Admitting: Family Medicine

## 2022-12-07 ENCOUNTER — Observation Stay (HOSPITAL_COMMUNITY): Payer: BLUE CROSS/BLUE SHIELD

## 2022-12-07 ENCOUNTER — Inpatient Hospital Stay (HOSPITAL_COMMUNITY)
Admit: 2022-12-07 | Discharge: 2022-12-07 | Disposition: A | Discharge status: Home / Self Care | Payer: BLUE CROSS/BLUE SHIELD | Attending: Internal Medicine | Admitting: Internal Medicine

## 2022-12-07 ENCOUNTER — Other Ambulatory Visit: Payer: Self-pay

## 2022-12-07 DIAGNOSIS — D72828 Other elevated white blood cell count: Secondary | ICD-10-CM | POA: Diagnosis present

## 2022-12-07 DIAGNOSIS — F05 Delirium due to known physiological condition: Secondary | ICD-10-CM | POA: Diagnosis not present

## 2022-12-07 DIAGNOSIS — F061 Catatonic disorder due to known physiological condition: Secondary | ICD-10-CM | POA: Diagnosis present

## 2022-12-07 DIAGNOSIS — G909 Disorder of the autonomic nervous system, unspecified: Secondary | ICD-10-CM | POA: Diagnosis present

## 2022-12-07 DIAGNOSIS — G9340 Encephalopathy, unspecified: Secondary | ICD-10-CM | POA: Diagnosis present

## 2022-12-07 DIAGNOSIS — G894 Chronic pain syndrome: Secondary | ICD-10-CM | POA: Diagnosis present

## 2022-12-07 DIAGNOSIS — R001 Bradycardia, unspecified: Secondary | ICD-10-CM | POA: Diagnosis present

## 2022-12-07 DIAGNOSIS — F1290 Cannabis use, unspecified, uncomplicated: Secondary | ICD-10-CM | POA: Diagnosis present

## 2022-12-07 DIAGNOSIS — Z91199 Patient's noncompliance with other medical treatment and regimen due to unspecified reason: Secondary | ICD-10-CM | POA: Diagnosis not present

## 2022-12-07 DIAGNOSIS — R4701 Aphasia: Secondary | ICD-10-CM | POA: Diagnosis present

## 2022-12-07 DIAGNOSIS — G9341 Metabolic encephalopathy: Secondary | ICD-10-CM | POA: Diagnosis present

## 2022-12-07 DIAGNOSIS — T4276XA Underdosing of unspecified antiepileptic and sedative-hypnotic drugs, initial encounter: Secondary | ICD-10-CM | POA: Diagnosis present

## 2022-12-07 DIAGNOSIS — R1310 Dysphagia, unspecified: Secondary | ICD-10-CM | POA: Diagnosis present

## 2022-12-07 DIAGNOSIS — G40909 Epilepsy, unspecified, not intractable, without status epilepticus: Secondary | ICD-10-CM | POA: Diagnosis present

## 2022-12-07 DIAGNOSIS — I441 Atrioventricular block, second degree: Secondary | ICD-10-CM | POA: Diagnosis present

## 2022-12-07 DIAGNOSIS — Z833 Family history of diabetes mellitus: Secondary | ICD-10-CM | POA: Diagnosis not present

## 2022-12-07 DIAGNOSIS — E876 Hypokalemia: Secondary | ICD-10-CM | POA: Diagnosis not present

## 2022-12-07 DIAGNOSIS — Z87891 Personal history of nicotine dependence: Secondary | ICD-10-CM | POA: Diagnosis not present

## 2022-12-07 DIAGNOSIS — M797 Fibromyalgia: Secondary | ICD-10-CM | POA: Diagnosis present

## 2022-12-07 DIAGNOSIS — Z91128 Patient's intentional underdosing of medication regimen for other reason: Secondary | ICD-10-CM | POA: Diagnosis not present

## 2022-12-07 DIAGNOSIS — R569 Unspecified convulsions: Secondary | ICD-10-CM | POA: Diagnosis not present

## 2022-12-07 DIAGNOSIS — M549 Dorsalgia, unspecified: Secondary | ICD-10-CM | POA: Diagnosis present

## 2022-12-07 DIAGNOSIS — F1210 Cannabis abuse, uncomplicated: Secondary | ICD-10-CM | POA: Diagnosis not present

## 2022-12-07 DIAGNOSIS — F32A Depression, unspecified: Secondary | ICD-10-CM | POA: Diagnosis present

## 2022-12-07 DIAGNOSIS — N3289 Other specified disorders of bladder: Secondary | ICD-10-CM | POA: Diagnosis present

## 2022-12-07 DIAGNOSIS — Z811 Family history of alcohol abuse and dependence: Secondary | ICD-10-CM | POA: Diagnosis not present

## 2022-12-07 DIAGNOSIS — R4182 Altered mental status, unspecified: Secondary | ICD-10-CM | POA: Diagnosis not present

## 2022-12-07 DIAGNOSIS — I700 Atherosclerosis of aorta: Secondary | ICD-10-CM | POA: Diagnosis present

## 2022-12-07 DIAGNOSIS — K219 Gastro-esophageal reflux disease without esophagitis: Secondary | ICD-10-CM | POA: Diagnosis present

## 2022-12-07 DIAGNOSIS — Z6372 Alcoholism and drug addiction in family: Secondary | ICD-10-CM | POA: Diagnosis not present

## 2022-12-07 LAB — CBC
HCT: 37.2 % (ref 36.0–46.0)
Hemoglobin: 12.7 g/dL (ref 12.0–15.0)
MCH: 31.8 pg (ref 26.0–34.0)
MCHC: 34.1 g/dL (ref 30.0–36.0)
MCV: 93 fL (ref 80.0–100.0)
Platelets: 357 10*3/uL (ref 150–400)
RBC: 4 MIL/uL (ref 3.87–5.11)
RDW: 13.6 % (ref 11.5–15.5)
WBC: 15.4 10*3/uL — ABNORMAL HIGH (ref 4.0–10.5)
nRBC: 0 % (ref 0.0–0.2)

## 2022-12-07 LAB — BASIC METABOLIC PANEL
Anion gap: 12 (ref 5–15)
BUN: 18 mg/dL (ref 6–20)
CO2: 23 mmol/L (ref 22–32)
Calcium: 9.4 mg/dL (ref 8.9–10.3)
Chloride: 105 mmol/L (ref 98–111)
Creatinine, Ser: 0.99 mg/dL (ref 0.44–1.00)
GFR, Estimated: >60 mL/min (ref >60)
Glucose, Bld: 116 mg/dL — ABNORMAL HIGH (ref 70–99)
Potassium: 4.1 mmol/L (ref 3.5–5.1)
Sodium: 140 mmol/L (ref 135–145)

## 2022-12-07 LAB — HIV ANTIBODY (ROUTINE TESTING W REFLEX): HIV Screen 4th Generation wRfx: NONREACTIVE

## 2022-12-07 MED ORDER — LACTATED RINGERS IV SOLN
INTRAVENOUS | Status: AC
  Administered 2022-12-07: 600 mL via INTRAVENOUS

## 2022-12-07 MED ORDER — IOHEXOL 300 MG/ML  SOLN
100.0000 mL | Freq: Once | INTRAMUSCULAR | Status: AC | PRN
  Administered 2022-12-07: 100 mL via INTRAVENOUS

## 2022-12-07 NOTE — Progress Notes (Signed)
PROGRESS NOTE    AHSLEY Baker  [id number] DOB: 01/21/1976 DOA: 12/06/2022 PCP: Lemmie Evens, MD    Brief Narrative:   Mckenzie Baker is a 47 y.o. female with past medical history significant for seizure disorder, autonomic dysfunction, chronic pain syndrome who presented to [facility] ED on 2/26 via EMS with confusion, nausea/vomiting.  Family reports has not had her antiseizure medications for the last few days.  Daughter concerned that she had not been out of bed all day seem confusion and not answering questions.  Upon arrival to the ED, patient's confusion was slightly improved and provided some history.  She states was fine day prior to admission but complained of some abdominal discomfort with nausea, nonbloody vomiting that day.  She denies dysuria, no fever, no chills, no headache, no neck stiffness.  In the ED, temperature 97.7 F, HR 40, RR 14, BP 127/61, SpO2 99% on room air.  WBC 19.9, hemoglobin 13.7, platelets 405.  Sodium 141, potassium 3.5, chloride 102, CO2 25, glucose 117, BUN 17, creatinine 0.96.  AST 25, ALT 20, total bilirubin 1.0.  Urinalysis unrevealing.  UDS positive for benzodiazepines, opiates, THC.  CT head without contrast with no acute intracranial abnormality.  CT abdomen/pelvis with contrast with markedly distended urinary bladder, postoperative changes L4/L5, moderate severity chronic and degenerative changes at the level of L5-S1, aortic atherosclerosis.  EKG with sinus bradycardia.  Patient was given 1 L normal saline, Zofran, Ativan and started on ceftriaxone.  TRH consulted for admission for further evaluation and management of altered mental status likely secondary to postictal state in the setting of noncompliance with antiepileptic therapy outpatient.  Assessment & Plan:   Acute metabolic encephalopathy, POA Suspect breakthrough seizure 2/2 medication noncompliance Hx seizure disorder Patient presenting to ED with confusion.  In the setting of  noncompliance with outpatient antiepileptics.  Follows with neurology, Dr. Delice Lesch outpatient.  Urinalysis unrevealing.  CT head without contrast with no acute intracranial findings.  UDS positive for benzos, opiates, THC. -- EEG: Pending -- Restart home oxcarbazepine 6 mg p.o. twice daily -- Seizure precautions  Bladder distention Abdominal pain, nausea/vomiting CT abdomen/pelvis with markedly distended urinary bladder.  Patient had been complaining of abdominal discomfort just below her umbilicus, likely secondary to urinary retention; otherwise no other acute findings on imaging. --Continue monitor urinary output closely -- Bladder scan as needed -- Antiemetics as needed  Leukocytosis WBC elevated 19.9 on admission, likely secondary to reactive in the setting of likely breakthrough seizure.  Patient is afebrile.  Urinalysis unrevealing.  CT abdomen/pelvis also unrevealing. --WBC 19.9>15.4 -- Continue monitor clinically off of antibiotics -- BC in a.m.  Autonomic dysfunction Hx second-degree AV block Mobitz type I Was seen by electrophysiology August 2023 for persistent bradycardia was diagnosed with autonomic dysfunction, second-degree AV block; although was not to be felt clinically significant.  EP recommended increased fluid and salt intake.  Also consideration for midodrine versus Florinef.  Outpatient follow-up with cardiology/EP.  Depression: -- Cymbalta 30 mg p.o. daily  Marijuana use disorder Counseled on need for abstinence/cessation.   DVT prophylaxis: enoxaparin (LOVENOX) injection 40 mg Start: 12/07/22 1000    Code Status: Full Code Family Communication: No family present at bedside this morning  Disposition Plan:  Level of care: Telemetry Status is: Observation The patient remains OBS appropriate and will d/c before 2 midnights.    Consultants:  None  Procedures:  EEG: Pending  Antimicrobials:  Ceftriaxone 2/26 - 2/26   Subjective: Patient seen  examined  bedside, resting calmly.  Remains in ED holding area.  Lying in bed.  Remains confused.  Understands she is at the hospital but believes the president currently is President Trump, year unknown.  Unable to tell me what her seizure medication is or who her primary neurologist is.  Reports her abdominal discomfort improved.  Denies any further nausea or vomiting.  No family present.  No other specific complaints or concerns at this time.  Denies headache, no visual changes, no chest pain, no shortness of breath, no fever/chills/night sweats, no current nausea/vomiting, no diarrhea, no focal weakness, no fatigue, no paresthesias.  No acute events overnight per nursing staff.  Objective: Vitals:   12/07/22 0700 12/07/22 0900 12/07/22 1130 12/07/22 1309  BP: 136/69 130/61 124/81 (!) 156/74  Pulse:  83 70 (!) 38  Resp:  '16 12 18  '$ Temp:    98.3 F (36.8 C)  TempSrc:    Oral  SpO2:  95% 96% 100%   No intake or output data in the 24 hours ending 12/07/22 1333 There were no vitals filed for this visit.  Examination:  Physical Exam: GEN: NAD, alert to place (hospital) but not time (no answer), or person (President: Trump) HEENT: NCAT, PERRL, EOMI, sclera clear, MMM PULM: CTAB w/o wheezes/crackles, normal respiratory effort, on room air CV: RRR w/o M/G/R GI: abd soft, NTND, NABS, no R/G/M MSK: no peripheral edema, moves all extremities independently  NEURO: No focal deficits PSYCH: Depressed mood, flat affect Integumentary: No concerning rashes/lesions/wounds noted on exposed skin surfaces    Data Reviewed: I have personally reviewed following labs and imaging studies  CBC: Recent Labs  Lab 12/06/22 1854 12/07/22 0518  WBC 19.9* 15.4*  NEUTROABS 17.9*  --   HGB 13.7 12.7  HCT 39.5 37.2  MCV 92.1 93.0  PLT 405* [id number]   Basic Metabolic Panel: Recent Labs  Lab 12/06/22 1854 12/07/22 0518  NA 141 140  K 3.5 4.1  CL 102 105  CO2 25 23  GLUCOSE 117* 116*  BUN 17 18   CREATININE 0.96 0.99  CALCIUM 10.1 9.4  MG 2.7*  --    GFR: CrCl cannot be calculated (Unknown ideal weight.). Liver Function Tests: Recent Labs  Lab 12/06/22 1854  AST 25  ALT 20  ALKPHOS 79  BILITOT 1.0  PROT 8.8*  ALBUMIN 5.2*   No results for input(s): "LIPASE", "AMYLASE" in the last 168 hours. No results for input(s): "AMMONIA" in the last 168 hours. Coagulation Profile: Recent Labs  Lab 12/06/22 1854  INR 1.0   Cardiac Enzymes: No results for input(s): "CKTOTAL", "CKMB", "CKMBINDEX", "TROPONINI" in the last 168 hours. BNP (last 3 results) No results for input(s): "PROBNP" in the last 8760 hours. HbA1C: No results for input(s): "HGBA1C" in the last 72 hours. CBG: Recent Labs  Lab 12/06/22 1810  GLUCAP 128*   Lipid Profile: No results for input(s): "CHOL", "HDL", "LDLCALC", "TRIG", "CHOLHDL", "LDLDIRECT" in the last 72 hours. Thyroid Function Tests: No results for input(s): "TSH", "T4TOTAL", "FREET4", "T3FREE", "THYROIDAB" in the last 72 hours. Anemia Panel: No results for input(s): "VITAMINB12", "FOLATE", "FERRITIN", "TIBC", "IRON", "RETICCTPCT" in the last 72 hours. Sepsis Labs: No results for input(s): "PROCALCITON", "LATICACIDVEN" in the last 168 hours.  No results found for this or any previous visit (from the past 240 hour(s)).       Radiology Studies: CT ABDOMEN PELVIS W CONTRAST  Result Date: 12/07/2022 CLINICAL DATA:  Vomiting. EXAM: CT ABDOMEN AND PELVIS WITH CONTRAST TECHNIQUE: Multidetector CT  imaging of the abdomen and pelvis was performed using the standard protocol following bolus administration of intravenous contrast. RADIATION DOSE REDUCTION: This exam was performed according to the departmental dose-optimization program which includes automated exposure control, adjustment of the mA and/or kV according to patient size and/or use of iterative reconstruction technique. CONTRAST:  160m OMNIPAQUE IOHEXOL 300 MG/ML  SOLN COMPARISON:  None  Available. FINDINGS: Lower chest: No acute abnormality. Hepatobiliary: No focal liver abnormality is seen. No gallstones, gallbladder wall thickening, or biliary dilatation. Pancreas: Unremarkable. No pancreatic ductal dilatation or surrounding inflammatory changes. Spleen: Normal in size without focal abnormality. Adrenals/Urinary Tract: Adrenal glands are unremarkable. Kidneys are normal, without renal calculi, focal lesion, or hydronephrosis. Urinary bladder is markedly distended. Stomach/Bowel: Stomach is within normal limits. The appendix is not clearly identified. No evidence of bowel wall thickening, distention, or inflammatory changes. Vascular/Lymphatic: Aortic atherosclerosis. No enlarged abdominal or pelvic lymph nodes. Reproductive: Uterus and bilateral adnexa are unremarkable. Other: No abdominal wall hernia or abnormality. No abdominopelvic ascites. Musculoskeletal: Postoperative changes are seen at the levels of L4 and L5. Moderate severity chronic and degenerative changes are seen at the level of L5-S1. IMPRESSION: 1. Markedly distended urinary bladder. 2. Postoperative changes at the levels of L4 and L5. 3. Moderate severity chronic and degenerative changes at the level of L5-S1. 4. Aortic atherosclerosis. Aortic Atherosclerosis (ICD10-I70.0). Electronically Signed   By: TVirgina NorfolkM.D.   On: 12/07/2022 01:22   CT Head Wo Contrast  Result Date: 12/06/2022 CLINICAL DATA:  Altered mental status EXAM: CT HEAD WITHOUT CONTRAST TECHNIQUE: Contiguous axial images were obtained from the base of the skull through the vertex without intravenous contrast. RADIATION DOSE REDUCTION: This exam was performed according to the departmental dose-optimization program which includes automated exposure control, adjustment of the mA and/or kV according to patient size and/or use of iterative reconstruction technique. COMPARISON:  06/09/2021 head CT FINDINGS: Brain: Preserved brain volume and gray-white matter  differentiation. No midline shift, mass effect or hydrocephalus. No abnormal intra or extra-axial fluid collections are identified. Vascular: No hyperdense vessel or unexpected calcification. Skull: Lung windows do not demonstrate a displaced or depressed skull fracture. Sinuses/Orbits: Visualized paranasal sinuses and mastoid air cells are clear. Globes are intact. Other: None. IMPRESSION: No acute intracranial abnormality. Electronically Signed   By: AJill SideM.D.   On: 12/06/2022 19:30        Scheduled Meds:  DULoxetine  30 mg Oral Daily   enoxaparin (LOVENOX) injection  40 mg Subcutaneous Q24H   Oxcarbazepine  600 mg Oral BID   sodium chloride flush  3 mL Intravenous Q12H   Continuous Infusions:  lactated ringers 100 mL/hr at 12/07/22 0036     LOS: 0 days    Time spent: 52 minutes spent on chart review, discussion with nursing staff, consultants, updating family and interview/physical exam; more than 50% of that time was spent in counseling and/or coordination of care.    Gwynneth Fabio J [place] DO [facility] Available via Epic secure chat 7am-7pm After these hours, please refer to coverage provider listed on [email or web address] 12/07/2022, 1:33 PM

## 2022-12-07 NOTE — TOC Initial Note (Signed)
Transition of Care [facility]) - Initial/Assessment Note    Patient Details  Name: Mckenzie Baker MRN: [id number] Date of Birth: Feb 09, 1976  Transition of Care (TOC) CM/SW Contact:    Joaquin Courts, RN Phone Number: 12/07/2022, 10:14 AM  Clinical Narrative:                     Transition of Care Department [facility]) has reviewed patient and no TOC needs have been identified at this time. We will continue to monitor patient advancement through interdisciplinary progression rounds. If new patient transition needs arise, please place a TOC consult.   Expected Discharge Plan: Home/Self Care

## 2022-12-07 NOTE — Progress Notes (Signed)
EEG complete - results pending 

## 2022-12-07 NOTE — Plan of Care (Signed)
  Problem: Education: Goal: Knowledge of General Education information will improve Description: Including pain rating scale, medication(s)/side effects and non-pharmacologic comfort measures 12/07/2022 1548 by Emmaline Life, RN Outcome: Progressing 12/07/2022 1545 by Emmaline Life, RN Outcome: Progressing

## 2022-12-07 NOTE — Procedures (Signed)
Patient Name: Mckenzie Baker  MRN: [id number]  Epilepsy Attending: Lora Havens  Referring Physician/Provider: Vianne Bulls, MD  Date: 12/07/2022 Duration: 22.32 mins  Patient history:  47 y.o. female with past medical history significant for seizure disorder, autonomic dysfunction, chronic pain syndrome who presented to [facility] ED on 2/26 via EMS with confusion, nausea/vomiting.   EEG to evaluate for seizure  Level of alertness: Awake  AEDs during EEG study: None  Technical aspects: This EEG study was done with scalp electrodes positioned according to the 10-20 International system of electrode placement. Electrical activity was reviewed with band pass filter of 1-'70Hz'$ , sensitivity of 7 uV/mm, display speed of 26m/sec with a '60Hz'$  notched filter applied as appropriate. EEG data were recorded continuously and digitally stored.  Video monitoring was available and reviewed as appropriate.  Description: The posterior dominant rhythm consists of 7.5 Hz activity of moderate voltage (25-35 uV) seen predominantly in posterior head regions, symmetric and reactive to eye opening and eye closing. Drowsiness was characterized by attenuation of the posterior background rhythm. EEG showed continuous generalized 6 to 7 Hz theta slowing. Hyperventilation and photic stimulation were not performed.     ABNORMALITY - Continuous slow, generalized  IMPRESSION: This study is suggestive of mild diffuse encephalopathy, nonspecific etiology. No seizures or epileptiform discharges were seen throughout the recording.  Please note lack of epileptiform activity during interictal EEG does not exclude the diagnosis of epilepsy.  Brucha Ahlquist OBarbra Sarks

## 2022-12-07 NOTE — ED Notes (Signed)
ED TO INPATIENT HANDOFF REPORT  ED Nurse Name and Phone #: Donella Stade [phone]  S Name/Age/Gender Mckenzie Baker 47 y.o. female Room/Bed: APA06/APA06  Code Status   Code Status: Full Code  Home/SNF/Other Home Patient oriented to: self, place, and time Is this baseline? No   Triage Complete: Triage complete  Chief Complaint Acute encephalopathy [G93.40]  Triage Note Pt arrived via ems for ams; family reports pt has been vomiting and not acting herself; pt is epileptic and has not had her medications for the last few days  Pt is clammy with "goosebumps" all over arms  Pt will follow commands but is not verbal   Allergies Allergies  Allergen Reactions   Cyclobenzaprine Hives and Other (See Comments)    Bad reaction     Level of Care/Admitting Diagnosis ED Disposition     ED Disposition  Admit   Condition  --   [facility]: [facility] [id number]  Level of Care: Telemetry [5]  Covid Evaluation: Asymptomatic - no recent exposure (last 10 days) testing not required  Diagnosis: Acute encephalopathy [id number]  Admitting Physician: Vianne Bulls [id number]  Attending Physician: Vianne Bulls [id number]          B Medical/Surgery History Past Medical History:  Diagnosis Date   Anxiety    Bell's palsy    Chronic back pain    Depression    Fibromyalgia    GERD (gastroesophageal reflux disease)    Headache    History of IBS    Junctional rhythm    Second degree AV block, Mobitz type I    Seizures (HCC)    Sinus bradycardia    Wears glasses    Past Surgical History:  Procedure Laterality Date   DILATION AND CURETTAGE OF UTERUS     LUMBAR LAMINECTOMY/DECOMPRESSION MICRODISCECTOMY Bilateral 03/30/2013   Procedure: LUMBAR LAMINECTOMY/DECOMPRESSION MICRODISCECTOMY 1 LEVEL;  Surgeon: Hosie Spangle, MD;  Location: [facility];  Service: Neurosurgery;  Laterality: Bilateral;  bilateral L45 laminotomy and microdiskectomy   TOOTH  EXTRACTION       A IV Location/Drains/Wounds Patient Lines/Drains/Airways Status     Active Line/Drains/Airways     Name Placement date Placement time Site Days   Peripheral IV 12/07/22 22 G Anterior;Distal;Left Forearm 12/07/22  0114  Forearm  less than 1   External Urinary Catheter 12/06/22  2048  --  1   Incision 03/30/13 Back Other (Comment) 03/30/13  0842  -- 3539   Incision (Closed) 10/30/14 Back Other (Comment) 10/30/14  1249  -- 2960            Intake/Output Last 24 hours No intake or output data in the 24 hours ending 12/07/22 1202  Labs/Imaging Results for orders placed or performed during the hospital encounter of 12/06/22 (from the past 48 hour(s))  CBG monitoring, ED     Status: Abnormal   Collection Time: 12/06/22  6:10 PM  Result Value Ref Range   Glucose-Capillary 128 (H) 70 - 99 mg/dL    Comment: Glucose reference range applies only to samples taken after fasting for at least 8 hours.  CBC with Differential     Status: Abnormal   Collection Time: 12/06/22  6:54 PM  Result Value Ref Range   WBC 19.9 (H) 4.0 - 10.5 K/uL   RBC 4.29 3.87 - 5.11 MIL/uL   Hemoglobin 13.7 12.0 - 15.0 g/dL   HCT 39.5 36.0 - 46.0 %   MCV 92.1 80.0 - 100.0 fL  MCH 31.9 26.0 - 34.0 pg   MCHC 34.7 30.0 - 36.0 g/dL   RDW 13.4 11.5 - 15.5 %   Platelets 405 (H) 150 - 400 K/uL   nRBC 0.0 0.0 - 0.2 %   Neutrophils Relative % 90 %   Neutro Abs 17.9 (H) 1.7 - 7.7 K/uL   Lymphocytes Relative 5 %   Lymphs Abs 1.0 0.7 - 4.0 K/uL   Monocytes Relative 4 %   Monocytes Absolute 0.9 0.1 - 1.0 K/uL   Eosinophils Relative 0 %   Eosinophils Absolute 0.0 0.0 - 0.5 K/uL   Basophils Relative 0 %   Basophils Absolute 0.0 0.0 - 0.1 K/uL   Immature Granulocytes 1 %   Abs Immature Granulocytes 0.10 (H) 0.00 - 0.07 K/uL    Comment: Performed at [facility], [address], [place], [place] [address]  Comprehensive metabolic panel     Status: Abnormal   Collection Time: 12/06/22  6:54 PM  Result  Value Ref Range   Sodium 141 135 - 145 mmol/L   Potassium 3.5 3.5 - 5.1 mmol/L   Chloride 102 98 - 111 mmol/L   CO2 25 22 - 32 mmol/L   Glucose, Bld 117 (H) 70 - 99 mg/dL    Comment: Glucose reference range applies only to samples taken after fasting for at least 8 hours.   BUN 17 6 - 20 mg/dL   Creatinine, Ser 0.96 0.44 - 1.00 mg/dL   Calcium 10.1 8.9 - 10.3 mg/dL   Total Protein 8.8 (H) 6.5 - 8.1 g/dL   Albumin 5.2 (H) 3.5 - 5.0 g/dL   AST 25 15 - 41 U/L   ALT 20 0 - 44 U/L   Alkaline Phosphatase 79 38 - 126 U/L   Total Bilirubin 1.0 0.3 - 1.2 mg/dL   GFR, Estimated >60 >60 mL/min    Comment: (NOTE) Calculated using the CKD-EPI Creatinine Equation (2021)    Anion gap 14 5 - 15    Comment: Performed at [facility], [address] [place], [place] [address]  Ethanol     Status: None   Collection Time: 12/06/22  6:54 PM  Result Value Ref Range   Alcohol, Ethyl (B) <10 <10 mg/dL    Comment: (NOTE) Lowest detectable limit for serum alcohol is 10 mg/dL.  For medical purposes only. Performed at [facility], [address], [place], [place] [address]   Protime-INR     Status: None   Collection Time: 12/06/22  6:54 PM  Result Value Ref Range   Prothrombin Time 13.1 11.4 - 15.2 seconds   INR 1.0 0.8 - 1.2    Comment: (NOTE) INR goal varies based on device and disease states. Performed at [facility], [address] [place], [place] [address]   Magnesium     Status: Abnormal   Collection Time: 12/06/22  6:54 PM  Result Value Ref Range   Magnesium 2.7 (H) 1.7 - 2.4 mg/dL    Comment: Performed at [facility], [address] [place],  [address]  Rapid urine drug screen (hospital performed)     Status: Abnormal   Collection Time: 12/06/22  9:32 PM  Result Value Ref Range   Opiates POSITIVE (A) NONE DETECTED   Cocaine NONE DETECTED NONE DETECTED   Benzodiazepines POSITIVE (A) NONE DETECTED   Amphetamines NONE DETECTED NONE DETECTED   Tetrahydrocannabinol POSITIVE (A)  NONE DETECTED   Barbiturates NONE DETECTED NONE DETECTED    Comment: (NOTE) [place]  PURPOSES ONLY.  IF CONFIRMATION IS NEEDED FOR ANY PURPOSE, NOTIFY LAB WITHIN 5 DAYS.  LOWEST DETECTABLE LIMITS FOR URINE DRUG SCREEN Drug Class                     Cutoff (ng/mL) Amphetamine and metabolites    1000 Barbiturate and metabolites    200 Benzodiazepine                 200 Opiates and metabolites        300 Cocaine and metabolites        300 THC                            50 Performed at [facility], [address] [place], [place] [address]   Urinalysis, Routine w reflex microscopic -Urine, Clean Catch     Status: Abnormal   Collection Time: 12/06/22  9:32 PM  Result Value Ref Range   Color, Urine YELLOW YELLOW   APPearance CLEAR CLEAR   Specific Gravity, Urine 1.018 1.005 - 1.030   pH 5.0 5.0 - 8.0   Glucose, UA NEGATIVE NEGATIVE mg/dL   Hgb urine dipstick NEGATIVE NEGATIVE   Bilirubin Urine NEGATIVE NEGATIVE   Ketones, ur 20 (A) NEGATIVE mg/dL   Protein, ur 30 (A) NEGATIVE mg/dL   Nitrite NEGATIVE NEGATIVE   Leukocytes,Ua NEGATIVE NEGATIVE   RBC / HPF 0-5 0 - 5 RBC/hpf   WBC, UA 0-5 0 - 5 WBC/hpf   Bacteria, UA RARE (A) NONE SEEN   Squamous Epithelial / HPF 0-5 0 - 5 /HPF   Mucus PRESENT     Comment: Performed at [facility], [address], [place], [place] [address]  HIV Antibody (routine testing w rflx)     Status: None   Collection Time: 12/07/22  5:18 AM  Result Value Ref Range   HIV Screen 4th Generation wRfx Non Reactive Non Reactive    Comment: Performed at [facility], [place] [address], [place], [place] [id number]  Basic metabolic panel     Status: Abnormal   Collection Time: 12/07/22  5:18 AM  Result Value Ref Range   Sodium 140 135 - 145 mmol/L   Potassium 4.1 3.5 - 5.1 mmol/L   Chloride 105 98 - 111 mmol/L   CO2 23 22 - 32 mmol/L   Glucose, Bld 116 (H) 70 - 99 mg/dL    Comment: Glucose reference range applies only to samples taken after  fasting for at least 8 hours.   BUN 18 6 - 20 mg/dL   Creatinine, Ser 0.99 0.44 - 1.00 mg/dL   Calcium 9.4 8.9 - 10.3 mg/dL   GFR, Estimated >60 >60 mL/min    Comment: (NOTE) Calculated using the CKD-EPI Creatinine Equation (2021)    Anion gap 12 5 - 15    Comment: Performed at [facility], [address] [place], [place] [address]  CBC     Status: Abnormal   Collection Time: 12/07/22  5:18 AM  Result Value Ref Range   WBC 15.4 (H) 4.0 - 10.5 K/uL   RBC 4.00 3.87 - 5.11 MIL/uL   Hemoglobin 12.7 12.0 - 15.0 g/dL   HCT 37.2 36.0 - 46.0 %   MCV 93.0 80.0 - 100.0 fL   MCH 31.8 26.0 - 34.0 pg   MCHC 34.1 30.0 - 36.0 g/dL   RDW 13.6 11.5 - 15.5 %   Platelets 357 150 -  400 K/uL   nRBC 0.0 0.0 - 0.2 %    Comment: Performed at [facility], [address], [place], [place] [address]   CT ABDOMEN PELVIS W CONTRAST  Result Date: 12/07/2022 CLINICAL DATA:  Vomiting. EXAM: CT ABDOMEN AND PELVIS WITH CONTRAST TECHNIQUE: Multidetector CT imaging of the abdomen and pelvis was performed using the standard protocol following bolus administration of intravenous contrast. RADIATION DOSE REDUCTION: This exam was performed according to the departmental dose-optimization program which includes automated exposure control, adjustment of the mA and/or kV according to patient size and/or use of iterative reconstruction technique. CONTRAST:  159m OMNIPAQUE IOHEXOL 300 MG/ML  SOLN COMPARISON:  None Available. FINDINGS: Lower chest: No acute abnormality. Hepatobiliary: No focal liver abnormality is seen. No gallstones, gallbladder wall thickening, or biliary dilatation. Pancreas: Unremarkable. No pancreatic ductal dilatation or surrounding inflammatory changes. Spleen: Normal in size without focal abnormality. Adrenals/Urinary Tract: Adrenal glands are unremarkable. Kidneys are normal, without renal calculi, focal lesion, or hydronephrosis. Urinary bladder is markedly distended. Stomach/Bowel: Stomach is within normal  limits. The appendix is not clearly identified. No evidence of bowel wall thickening, distention, or inflammatory changes. Vascular/Lymphatic: Aortic atherosclerosis. No enlarged abdominal or pelvic lymph nodes. Reproductive: Uterus and bilateral adnexa are unremarkable. Other: No abdominal wall hernia or abnormality. No abdominopelvic ascites. Musculoskeletal: Postoperative changes are seen at the levels of L4 and L5. Moderate severity chronic and degenerative changes are seen at the level of L5-S1. IMPRESSION: 1. Markedly distended urinary bladder. 2. Postoperative changes at the levels of L4 and L5. 3. Moderate severity chronic and degenerative changes at the level of L5-S1. 4. Aortic atherosclerosis. Aortic Atherosclerosis (ICD10-I70.0). Electronically Signed   By: TVirgina NorfolkM.D.   On: 12/07/2022 01:22   CT Head Wo Contrast  Result Date: 12/06/2022 CLINICAL DATA:  Altered mental status EXAM: CT HEAD WITHOUT CONTRAST TECHNIQUE: Contiguous axial images were obtained from the base of the skull through the vertex without intravenous contrast. RADIATION DOSE REDUCTION: This exam was performed according to the departmental dose-optimization program which includes automated exposure control, adjustment of the mA and/or kV according to patient size and/or use of iterative reconstruction technique. COMPARISON:  06/09/2021 head CT FINDINGS: Brain: Preserved brain volume and gray-white matter differentiation. No midline shift, mass effect or hydrocephalus. No abnormal intra or extra-axial fluid collections are identified. Vascular: No hyperdense vessel or unexpected calcification. Skull: Lung windows do not demonstrate a displaced or depressed skull fracture. Sinuses/Orbits: Visualized paranasal sinuses and mastoid air cells are clear. Globes are intact. Other: None. IMPRESSION: No acute intracranial abnormality. Electronically Signed   By: AJill SideM.D.   On: 12/06/2022 19:30    Pending Labs Unresulted  Labs (From admission, onward)     Start     Ordered   12/13/22 0500  Creatinine, serum  (enoxaparin (LOVENOX)    CrCl >/= 30 ml/min)  Weekly,   R     Comments: while on enoxaparin therapy    12/06/22 2247            Vitals/Pain Today's Vitals   12/07/22 0645 12/07/22 0700 12/07/22 0823 12/07/22 0900  BP:  136/69  130/61  Pulse: (!) 56   83  Resp: 18   16  Temp:      TempSrc:      SpO2: 97%   95%  PainSc:   Asleep     Isolation Precautions No active isolations  Medications Medications  DULoxetine (CYMBALTA) DR capsule 30 mg (30 mg Oral Given 12/07/22  1001)  Oxcarbazepine (TRILEPTAL) tablet 600 mg (600 mg Oral Given 12/07/22 1001)  enoxaparin (LOVENOX) injection 40 mg (40 mg Subcutaneous Given 12/07/22 1001)  sodium chloride flush (NS) 0.9 % injection 3 mL (3 mLs Intravenous Not Given 12/07/22 0933)  lactated ringers infusion ( Intravenous New Bag/Given 12/07/22 0036)  acetaminophen (TYLENOL) tablet 650 mg (has no administration in time range)    Or  acetaminophen (TYLENOL) suppository 650 mg (has no administration in time range)  polyethylene glycol (MIRALAX / GLYCOLAX) packet 17 g (has no administration in time range)  ondansetron (ZOFRAN) tablet 4 mg (has no administration in time range)    Or  ondansetron (ZOFRAN) injection 4 mg (has no administration in time range)  LORazepam (ATIVAN) injection 1 mg (1 mg Intravenous Given 12/06/22 2025)  sodium chloride 0.9 % bolus 1,000 mL (0 mLs Intravenous Stopped 12/07/22 0113)  ondansetron (ZOFRAN) injection 4 mg (4 mg Intravenous Given 12/06/22 2220)  cefTRIAXone (ROCEPHIN) 1 g in sodium chloride 0.9 % 100 mL IVPB (0 g Intravenous Stopped 12/06/22 2339)  iohexol (OMNIPAQUE) 300 MG/ML solution 100 mL (100 mLs Intravenous Contrast Given 12/07/22 0043)    Mobility walks     Focused Assessments Neuro Assessment Handoff:  Swallow screen pass?  Able to swallow pills without difficulty         Neuro Assessment: Within Defined  Limits Neuro Checks:      Has TPA been given? No If patient is a Neuro Trauma and patient is going to OR before floor call report to [place] nurse: [phone] or [phone]   R Recommendations: See Admitting Provider Note  Report given to:   Additional Notes: pt is alert and oriented but not able to really tell the situation that brought her in. She is slow to respond and does not engage appropriately.

## 2022-12-07 NOTE — Progress Notes (Signed)
I had been in the patients room earlier this shift to look to start a new IV and the patient would not move nor attempt to move. She just stares at the tv. I asked the tech to help me clean her and when the tech sprayed her with the cleaning spray it was cold and the patient sat up in the bed and yelled at the tech. It was no words, just a loud noise. I informed the patient that I have seen her wiggle her feet and I just saw her rise up in the bed and that she could help [place] get her cleaned up. She did not roll on her own but she did help put her legs in the mesh under wear and raised her buttocks off the bed so we could pull them up.

## 2022-12-07 NOTE — Progress Notes (Signed)
Patients daughter at bedside, daughter works EMS, stating patients pupils are uneven and not patients baseline. Daughter is requesting toxicology screen to be ran tomorrow.

## 2022-12-07 NOTE — Plan of Care (Signed)
  Problem: Education: Goal: Knowledge of General Education information will improve Description Including pain rating scale, medication(s)/side effects and non-pharmacologic comfort measures Outcome: Progressing   

## 2022-12-08 ENCOUNTER — Inpatient Hospital Stay (HOSPITAL_COMMUNITY): Payer: BLUE CROSS/BLUE SHIELD

## 2022-12-08 DIAGNOSIS — G9340 Encephalopathy, unspecified: Secondary | ICD-10-CM | POA: Diagnosis not present

## 2022-12-08 LAB — BASIC METABOLIC PANEL
Anion gap: 11 (ref 5–15)
BUN: 18 mg/dL (ref 6–20)
CO2: 23 mmol/L (ref 22–32)
Calcium: 9.2 mg/dL (ref 8.9–10.3)
Chloride: 104 mmol/L (ref 98–111)
Creatinine, Ser: 0.69 mg/dL (ref 0.44–1.00)
GFR, Estimated: >60 mL/min (ref >60)
Glucose, Bld: 98 mg/dL (ref 70–99)
Potassium: 3.2 mmol/L — ABNORMAL LOW (ref 3.5–5.1)
Sodium: 138 mmol/L (ref 135–145)

## 2022-12-08 LAB — CBC
HCT: 37.6 % (ref 36.0–46.0)
Hemoglobin: 12.8 g/dL (ref 12.0–15.0)
MCH: 31.8 pg (ref 26.0–34.0)
MCHC: 34 g/dL (ref 30.0–36.0)
MCV: 93.5 fL (ref 80.0–100.0)
Platelets: 283 10*3/uL (ref 150–400)
RBC: 4.02 MIL/uL (ref 3.87–5.11)
RDW: 13.7 % (ref 11.5–15.5)
WBC: 13.5 10*3/uL — ABNORMAL HIGH (ref 4.0–10.5)
nRBC: 0 % (ref 0.0–0.2)

## 2022-12-08 LAB — MAGNESIUM: Magnesium: 2.1 mg/dL (ref 1.7–2.4)

## 2022-12-08 MED ORDER — ENSURE ENLIVE PO LIQD
237.0000 mL | Freq: Two times a day (BID) | ORAL | Status: DC
  Administered 2022-12-09 (×2): 237 mL via ORAL

## 2022-12-08 MED ORDER — POTASSIUM CHLORIDE CRYS ER 20 MEQ PO TBCR
40.0000 meq | EXTENDED_RELEASE_TABLET | Freq: Once | ORAL | Status: DC
  Filled 2022-12-08: qty 2

## 2022-12-08 MED ORDER — LORAZEPAM 2 MG/ML IJ SOLN
2.0000 mg | Freq: Once | INTRAMUSCULAR | Status: AC
  Administered 2022-12-08: 2 mg via INTRAVENOUS
  Filled 2022-12-08: qty 1

## 2022-12-08 NOTE — Progress Notes (Signed)
Initial Nutrition Assessment  DOCUMENTATION CODES:      INTERVENTION:  Recommend Speech Therapy assess swallow function and recommend safe diet   Ensure Enlive po BID, each supplement provides 350 kcal and 20 grams of protein.   NUTRITION DIAGNOSIS:   Inadequate oral intake related to  (altered mental status) as evidenced by per patient/family report.   GOAL:  Patient will meet greater than or equal to 90% of their needs   MONITOR:  Supplement acceptance, Labs, Weight trends  REASON FOR ASSESSMENT:   Malnutrition Screening Tool    ASSESSMENT: Patient is a 47 yo female with hx of anxiety / depression, seizures, substance abuse, GERD, IBS. Presents with altered mental status.   Patient daughter is bedside and provided history. Patient not speaking but will answer yes or no by shaking her head. Patient ate no food on Monday, she had a few bites of meal last night and bacon and peaches this morning. Currently holding food in mouth and coughing with liquids.   Patient weight history reviewed. Usual range 70-75 kg. Currently (159.7 lb)  72.6 kg.   Medications: klor-con, Cymbalta. Lactated Ringers discontinued.   Labs:    Latest Ref Rng & Units 12/08/2022    4:04 AM 12/07/2022    5:18 AM 12/06/2022    6:54 PM  BMP  Glucose 70 - 99 mg/dL 98  116  117   BUN 6 - 20 mg/dL '18  18  17   '$ Creatinine 0.44 - 1.00 mg/dL 0.69  0.99  0.96   Sodium 135 - 145 mmol/L 138  140  141   Potassium 3.5 - 5.1 mmol/L 3.2  4.1  3.5   Chloride 98 - 111 mmol/L 104  105  102   CO2 22 - 32 mmol/L '23  23  25   '$ Calcium 8.9 - 10.3 mg/dL 9.2  9.4  10.1        NUTRITION - FOCUSED PHYSICAL EXAM: Deferred today    Diet Order:   Diet Order             Diet regular Room service appropriate? Yes; Fluid consistency: Thin  Diet effective now                   EDUCATION NEEDS:  Not appropriate for education at this time  Skin:  Skin Assessment: Reviewed RN Assessment  Last BM:   2/28  Height:   Ht Readings from Last 1 Encounters:  12/07/22 '5\' 9"'$  (1.753 m)    Weight:   Wt Readings from Last 1 Encounters:  12/07/22 72.6 kg    Ideal Body Weight:   66 kg  BMI:  Body mass index is 23.64 kg/m.  Estimated Nutritional Needs:   Kcal:  1900-2100  Protein:  88-95 gr  Fluid:  2 liters daily   Colman Cater MS,RD,CSG,LDN Contact: Shea Evans

## 2022-12-08 NOTE — Progress Notes (Signed)
Patient was able to eat peaches and bacon per daughter, but patient will not swallow crushed pills with apple sauce and water. MD Adhikari notified.

## 2022-12-08 NOTE — Plan of Care (Signed)
  Problem: Pain Managment: Goal: General experience of comfort will improve Outcome: Progressing   

## 2022-12-08 NOTE — Consult Note (Signed)
I connected with  Mckenzie Baker on 12/08/22 by a video enabled telemedicine application and verified that I am speaking with the correct person using two identifiers.   I discussed the limitations of evaluation and management by telemedicine. The patient's daughter expressed understanding and agreed to proceed.  Location of patient: [facility] Location of physician: [facility]   Neurology Consultation Reason for Consult: Nonverbal Referring Physician: Dr Shelly Coss  CC: non verbal  History is obtained from: Chart review, patient daughter at bedside.  Patient is mute  HPI: Mckenzie Baker is a 47 y.o. female with past medical history of seizure-like episodes on oxcarbazepine, bents palsy, depression, second-degree AV block who was brought in due to altered mental status.  Patient is mute and unable to provide any history but does not at times.  Per daughter at bedside, patient has been complaining of not feeling well since Sunday, did not elaborate any further.  However on 12/06/2022, patient daughter noticed that patient was nonverbal.  Since being in the emergency room, patient has been able to follow commands but continues to remain nonverbal.  She has been refusing her medications, takes them out.  Is able to drink fluids.  Neurology was consulted due to history of seizures.  Routine EEG has been performed and did not show any Clavin interictal abnormality.  MRI brain without contrast did not show any abnormality.  Urine drug screen was positive for opiates, benzos (both prescribed) as well as THC.  No evidence of infection.  ROS: All other systems reviewed and negative except as noted in the HPI.  Past Medical History:  Diagnosis Date   Anxiety    Bell's palsy    Chronic back pain    Depression    Fibromyalgia    GERD (gastroesophageal reflux disease)    Headache    History of IBS    Junctional rhythm    Second degree AV block, Mobitz type I    Seizures  (HCC)    Sinus bradycardia    Wears glasses      Family History  Problem Relation Age of Onset   Cancer Father    Alcoholism Father    Diabetes Sister     Social History:  reports that she quit smoking about 15 months ago. Her smoking use included cigarettes and e-cigarettes. She has a 20.00 pack-year smoking history. She has never used smokeless tobacco. She reports that she does not currently use alcohol. She reports current drug use. Drug: Marijuana.   Medications Prior to Admission  Medication Sig Dispense Refill Last Dose   baclofen (LIORESAL) 10 MG tablet Take 10 mg by mouth 2 (two) times daily.   Past Week   DULoxetine (CYMBALTA) 30 MG capsule Take 30 mg by mouth daily.   Past Week   LORazepam (ATIVAN) 2 MG tablet Take 4 mg by mouth 2 (two) times daily.   Past Week   Oxcarbazepine (TRILEPTAL) 300 MG tablet Take 1/2 tablet twice a day for 1 week, then increase to 1 tablet twice a day for 1 week, then 2 tablets twice a day and continue 120 tablet 6 Past Week   oxyCODONE-acetaminophen (PERCOCET) 10-325 MG tablet Take 1 tablet by mouth every 4 (four) hours as needed for pain.   Past Week   levETIRAcetam (KEPPRA) 1000 MG tablet Take 1 tablet (1,000 mg total) by mouth 2 (two) times daily. (Patient not taking: Reported on 12/07/2022) 60 tablet 11 Not Taking  Exam: Current vital signs: BP (!) 152/93 (BP Location: Left Arm)   Pulse 76   Temp 99 F (37.2 C) (Oral)   Resp 16   Ht '5\' 9"'$  (1.753 m)   Wt 72.6 kg   SpO2 100%   BMI 23.64 kg/m  Vital signs in last 24 hours: Temp:  [98.3 F (36.8 C)-99.4 F (37.4 C)] 99 F (37.2 C) (02/28 0618) Pulse Rate:  [38-76] 76 (02/28 0618) Resp:  [16-18] 16 (02/28 0618) BP: (139-157)/(74-93) 152/93 (02/28 0618) SpO2:  [97 %-100 %] 100 % (02/28 0618) Weight:  [72.6 kg] 72.6 kg (02/27 1425)   Physical Exam  Constitutional: Appears well-developed and well-nourished.  Eyes: No scleral injection Neuro: Patient is awake laying in bed,  able to look at me and nod appropriately, she will follow simple one-step commands at times but remains mute, did not participate in rest of the exam  I have reviewed labs in epic and the results pertinent to this consultation are: CBC:  Recent Labs  Lab 12/06/22 1854 12/07/22 0518 12/08/22 0404  WBC 19.9* 15.4* 13.5*  NEUTROABS 17.9*  --   --   HGB 13.7 12.7 12.8  HCT 39.5 37.2 37.6  MCV 92.1 93.0 93.5  PLT 405* 357 [id number]    Basic Metabolic Panel:  Lab Results  Component Value Date   NA 138 12/08/2022   K 3.2 (L) 12/08/2022   CO2 23 12/08/2022   GLUCOSE 98 12/08/2022   BUN 18 12/08/2022   CREATININE 0.69 12/08/2022   CALCIUM 9.2 12/08/2022   GFRNONAA >60 12/08/2022   GFRAA 79 (L) 10/23/2014   Lipid Panel: No results found for: "LDLCALC" HgbA1c: No results found for: "HGBA1C" Urine Drug Screen:     Component Value Date/Time   LABOPIA POSITIVE (A) 12/06/2022 2132   COCAINSCRNUR NONE DETECTED 12/06/2022 2132   LABBENZ POSITIVE (A) 12/06/2022 2132   AMPHETMU NONE DETECTED 12/06/2022 2132   THCU POSITIVE (A) 12/06/2022 2132   LABBARB NONE DETECTED 12/06/2022 2132    Alcohol Level     Component Value Date/Time   ETH <10 12/06/2022 1854    I have reviewed the images obtained: MRI brain without contrast 12/08/2022: No acute abnormality. CT head without contrast 12/06/2022: No acute abnormality.  ASSESSMENT/PLAN: 47 year old female with history of seizure-like episodes on oxcarbazepine who was brought in after being nonverbal.  Speech disturbance -On exam, patient is able to follow commands but is not speaking at all, laying with eyes open.  This is not typical for expressive aphasia.  Low suspicion for catatonia.  No seizures on EEG, no acute abnormality on MRI.  Patient is able to drink water but spitting out food and medications.  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 -Recommend psychiatry consult -Will give  IV Ativan 2 mg for catatonia although low suspicion -Discussed plan with patient and daughter at bedside as well as Dr. Tawanna Solo via secure chat  Thank you for allowing [place] to participate in the care of this patient. If you have any further questions, please contact  me or neurohospitalist.   [facility] neurohospitalist

## 2022-12-08 NOTE — Progress Notes (Signed)
PROGRESS NOTE  Mckenzie Baker  [id number] DOB: 07-Feb-1976 DOA: 12/06/2022 PCP: Lemmie Evens, MD   Brief Narrative: Patient is a 47 year old female with history of seizure disorder, autonomic dysfunction, chronic pain syndrome who presented here with confusion, nausea/vomiting and concern for seizure episode.  As per the report, she was not taking her antiseizure medication for last few days.  Patient was found to be confused.  On presentation ,she was hemodynamically stable.  Lab work showed WBC of 19.9.  UDS positive for benzodiazepines, opiates, THC.  CT head did not show any acute intracranial  abnormalities.  CT abdomen/pelvis showed distended urinary bladder, chronic changes of the spine.  Hospital course remarkable for persistent postictal state.  She remains confused today.  Doing MRI.  Assessment & Plan:  Principal Problem:   Acute encephalopathy Active Problems:   Seizures (HCC)   Autonomic dysfunction   Acute metabolic encephalopathy   Acute metabolic encephalopathy secondary to seizures: History of seizure disorder.  Noncompliant with outpatient antiepileptics.  Follows with Dr. Delice Lesch as an outpatient.  UA not suspicious for UTI.  CT head did not show  any acute findings.  UDS positive for benzo, opiates, THC.  EEG was done here: Showed mild diffuse encephalopathy, no seizure or epileptiform discharge.  Home antiseizure medication oxcarbazepine is started here.  Continue seizure precaution. Patient remains aphasic today, staring blankly.  As per the daughter, no history of psychiatric problems.  Obeys commands.  Will do MRI of the brain.  Will also request neurology evaluation.  Nausea/vomiting/bladder distention: CT abdomen/pelvis showed distended urinary bladder.  Today abdomen is nondistended.  Urine is draining on pu reek  Bladder scan will be done as needed.  Leukocytosis: WBC of 19.9 on admission.  Likely reactive in the setting of seizure.  Patient is afebrile.   Urinalysis not positive for UTI.  CT abdomen/pelvis also nonrevealing.  WC count improving.  History of autoimmune dysfunction/secondary AV block: Follows with electrophysiology for persistent bradycardia.  EP recommended increase fluid/salt intake, consideration of midodrine/Florinef.  Will recommend outpatient follow-up with cardiology/EP.  Depression: Continue Cymbalta  Marijuana use: Counseled cessation.  UDS positive for benzodiazepines, THC, opiates  Back pain/decreased mobiity: Will request PT evaluation  Hypokalemia: Supplemented potassium       DVT prophylaxis:enoxaparin (LOVENOX) injection 40 mg Start: 12/07/22 1000     Code Status: Full Code  Family Communication: Discussed with daughter at bedside  Patient status: In patient  Patient is from : Home  Anticipated discharge to: Home  Estimated DC date: In 1 to 2 days, remains postictal, mental status will be back to baseline before discharge   Consultants: Neurology  Procedures: None  Antimicrobials:  Anti-infectives (From admission, onward)    Start     Dose/Rate Route Frequency Ordered Stop   12/06/22 2145  cefTRIAXone (ROCEPHIN) 1 g in sodium chloride 0.9 % 100 mL IVPB        1 g 200 mL/hr over 30 Minutes Intravenous  Once 12/06/22 2138 12/06/22 2339       Subjective:  Patient seen and examined at bedside today.  Hemodynamically stable.  Lying in bed.  Appears comfortable.  But aphasic, does not speak.  Daughter at bedside.  Obeys commands moves all extremities.   Objective: Vitals:   12/07/22 1642 12/07/22 2048 12/08/22 0048 12/08/22 0618  BP: (!) 157/80 139/83 (!) 149/91 (!) 152/93  Pulse: 64 72 76 76  Resp: '18  16 16  '$ Temp: 99 F (37.2 C) 99.4 F (37.4 C)  99.4 F (37.4 C) 99 F (37.2 C)  TempSrc: Oral Oral Oral Oral  SpO2: 97% 100% 99% 100%  Weight:      Height:        Intake/Output Summary (Last 24 hours) at 12/08/2022 0819 Last data filed at 12/08/2022 [id number] Gross per 24 hour   Intake 891.03 ml  Output 1050 ml  Net -158.97 ml   Filed Weights   2022-12-10 1425  Weight: 72.6 kg    Examination:  General exam: Overall comfortable, not in distress,lying on bed HEENT: PERRL Respiratory system:  no wheezes or crackles  Cardiovascular system: S1 & S2 heard, RRR.  Gastrointestinal system: Abdomen is nondistended, soft and nontender. Central nervous system: Alert and awake, obeys commands, doesnot speak Extremities: No edema, no clubbing ,no cyanosis Skin: No rashes, no ulcers,no icterus     Data Reviewed: I have personally reviewed following labs and imaging studies  CBC: Recent Labs  Lab 12/06/22 1854 Dec 10, 2022 0518 12/08/22 0404  WBC 19.9* 15.4* 13.5*  NEUTROABS 17.9*  --   --   HGB 13.7 12.7 12.8  HCT 39.5 37.2 37.6  MCV 92.1 93.0 93.5  PLT 405* 357 [id number]   Basic Metabolic Panel: Recent Labs  Lab 12/06/22 1854 12-10-2022 0518 12/08/22 0404  NA 141 140 138  K 3.5 4.1 3.2*  CL 102 105 104  CO2 '25 23 23  '$ GLUCOSE 117* 116* 98  BUN '17 18 18  '$ CREATININE 0.96 0.99 0.69  CALCIUM 10.1 9.4 9.2  MG 2.7*  --  2.1     No results found for this or any previous visit (from the past 240 hour(s)).   Radiology Studies: EEG adult  Result Date: 12/10/2022 Lora Havens, MD     2022/12/10  4:18 PM Patient Name: Mckenzie Baker MRN: [id number] Epilepsy Attending: Lora Havens Referring Physician/Provider: Vianne Bulls, MD Date: 12/10/22 Duration: 22.32 mins Patient history:  47 y.o. female with past medical history significant for seizure disorder, autonomic dysfunction, chronic pain syndrome who presented to [facility] ED on 2/26 via EMS with confusion, nausea/vomiting.   EEG to evaluate for seizure Level of alertness: Awake AEDs during EEG study: None Technical aspects: This EEG study was done with scalp electrodes positioned according to the 10-20 International system of electrode placement. Electrical activity was reviewed with band pass filter of  1-'70Hz'$ , sensitivity of 7 uV/mm, display speed of 31m/sec with a '60Hz'$  notched filter applied as appropriate. EEG data were recorded continuously and digitally stored.  Video monitoring was available and reviewed as appropriate. Description: The posterior dominant rhythm consists of 7.5 Hz activity of moderate voltage (25-35 uV) seen predominantly in posterior head regions, symmetric and reactive to eye opening and eye closing. Drowsiness was characterized by attenuation of the posterior background rhythm. EEG showed continuous generalized 6 to 7 Hz theta slowing. Hyperventilation and photic stimulation were not performed.   ABNORMALITY - Continuous slow, generalized IMPRESSION: This study is suggestive of mild diffuse encephalopathy, nonspecific etiology. No seizures or epileptiform discharges were seen throughout the recording. Please note lack of epileptiform activity during interictal EEG does not exclude the diagnosis of epilepsy. PLora Havens  CT ABDOMEN PELVIS W CONTRAST  Result Date: 2March 01, 2024CLINICAL DATA:  Vomiting. EXAM: CT ABDOMEN AND PELVIS WITH CONTRAST TECHNIQUE: Multidetector CT imaging of the abdomen and pelvis was performed using the standard protocol following bolus administration of intravenous contrast. RADIATION DOSE REDUCTION: This exam was performed according to the departmental dose-optimization program which  includes automated exposure control, adjustment of the mA and/or kV according to patient size and/or use of iterative reconstruction technique. CONTRAST:  134m OMNIPAQUE IOHEXOL 300 MG/ML  SOLN COMPARISON:  None Available. FINDINGS: Lower chest: No acute abnormality. Hepatobiliary: No focal liver abnormality is seen. No gallstones, gallbladder wall thickening, or biliary dilatation. Pancreas: Unremarkable. No pancreatic ductal dilatation or surrounding inflammatory changes. Spleen: Normal in size without focal abnormality. Adrenals/Urinary Tract: Adrenal glands are  unremarkable. Kidneys are normal, without renal calculi, focal lesion, or hydronephrosis. Urinary bladder is markedly distended. Stomach/Bowel: Stomach is within normal limits. The appendix is not clearly identified. No evidence of bowel wall thickening, distention, or inflammatory changes. Vascular/Lymphatic: Aortic atherosclerosis. No enlarged abdominal or pelvic lymph nodes. Reproductive: Uterus and bilateral adnexa are unremarkable. Other: No abdominal wall hernia or abnormality. No abdominopelvic ascites. Musculoskeletal: Postoperative changes are seen at the levels of L4 and L5. Moderate severity chronic and degenerative changes are seen at the level of L5-S1. IMPRESSION: 1. Markedly distended urinary bladder. 2. Postoperative changes at the levels of L4 and L5. 3. Moderate severity chronic and degenerative changes at the level of L5-S1. 4. Aortic atherosclerosis. Aortic Atherosclerosis (ICD10-I70.0). Electronically Signed   By: TVirgina NorfolkM.D.   On: 12/07/2022 01:22   CT Head Wo Contrast  Result Date: 12/06/2022 CLINICAL DATA:  Altered mental status EXAM: CT HEAD WITHOUT CONTRAST TECHNIQUE: Contiguous axial images were obtained from the base of the skull through the vertex without intravenous contrast. RADIATION DOSE REDUCTION: This exam was performed according to the departmental dose-optimization program which includes automated exposure control, adjustment of the mA and/or kV according to patient size and/or use of iterative reconstruction technique. COMPARISON:  06/09/2021 head CT FINDINGS: Brain: Preserved brain volume and gray-white matter differentiation. No midline shift, mass effect or hydrocephalus. No abnormal intra or extra-axial fluid collections are identified. Vascular: No hyperdense vessel or unexpected calcification. Skull: Lung windows do not demonstrate a displaced or depressed skull fracture. Sinuses/Orbits: Visualized paranasal sinuses and mastoid air cells are clear. Globes  are intact. Other: None. IMPRESSION: No acute intracranial abnormality. Electronically Signed   By: AJill SideM.D.   On: 12/06/2022 19:30    Scheduled Meds:  DULoxetine  30 mg Oral Daily   enoxaparin (LOVENOX) injection  40 mg Subcutaneous Q24H   Oxcarbazepine  600 mg Oral BID   potassium chloride  40 mEq Oral Once   sodium chloride flush  3 mL Intravenous Q12H   Continuous Infusions:  lactated ringers 75 mL/hr at 12/08/22 0326     LOS: 1 day   AShelly Coss MD [facility] P2/28/2024, 8:19 AM

## 2022-12-08 NOTE — Progress Notes (Signed)
Patient answers questions appropriately other than the exact date. Patient states her name, where she is and that it is February. Patient was provided a protein shake since she did not have much breakfast and no lunch. MD Adhikari notified.

## 2022-12-08 NOTE — TOC Progression Note (Signed)
Transition of Care [facility]) - Progression Note    Patient Details  Name: Mckenzie Baker MRN: [id number] Date of Birth: [birth year]  Transition of Care [facility]) CM/SW Contact  Salome Arnt, [place] Phone Number: 12/08/2022, 10:42 AM  Clinical Narrative: LCSW spoke with pt's daughter and mother at their request about home care. Pt does not need home health services, but family feels pt will need someone for safety and due to confusion. LCSW explained that this would be private pay as pt does not have Medicaid. Family indicates husband's income is too high to qualify for Medicaid. Family appreciative of information.       Expected Discharge Plan: Home/Self Care Barriers to Discharge: Continued Medical Work up  Expected Discharge Plan and Services                                               Social Determinants of Health (SDOH) Interventions SDOH Screenings   Food Insecurity: No Food Insecurity (12/07/2022)  Housing: Low Risk  (12/07/2022)  Transportation Needs: No Transportation Needs (12/07/2022)  Utilities: Not At Risk (12/07/2022)  Tobacco Use: Medium Risk (12/07/2022)    Readmission Risk Interventions    06/10/2021   12:49 PM  Readmission Risk Prevention Plan  Medication Screening Complete  Transportation Screening Complete

## 2022-12-08 NOTE — Evaluation (Signed)
Physical Therapy Evaluation Patient Details Name: Mckenzie Baker MRN: [id number] DOB: 1976-10-06 Today's Date: 12/08/2022  History of Present Illness  Mckenzie Baker is a 47 y.o. female with medical history significant for seizures, autonomic dysfunction, and chronic pain who presents to the emergency department with altered mental status.     Patient has been out of her antiepileptics for a few days, was able to obtain them today but had not taken any yet.  Her daughter became concerned that she had not been out of bed all day, seem to be confused, and not answering questions.     In the ED, patient has gradually been improving and is providing some history but at time of admission.  She reports that she was fine yesterday but has been experiencing some abdominal discomfort with nausea and nonbloody vomiting today.  She denies dysuria, fever, chills, headache, or neck stiffness.   Clinical Impression  Patient functioning near baseline for functional mobility and gait, able to follow directions consistently for functional activities and demonstrated good return for walking in room, hallways without loss of balance. Patient encouraged to ambulate daily as tolerated with family, nursing staff supervising.  Plan:  Patient discharged from physical therapy to care of nursing for ambulation daily as tolerated for length of stay.         Recommendations for follow up therapy are one component of a multi-disciplinary discharge planning process, led by the attending physician.  Recommendations may be updated based on patient status, additional functional criteria and insurance authorization.  Follow Up Recommendations No PT follow up      Assistance Recommended at Discharge Set up Supervision/Assistance  Patient can return home with the following  Help with stairs or ramp for entrance    Equipment Recommendations None recommended by PT  Recommendations for Other Services       Functional Status  Assessment Patient has not had a recent decline in their functional status     Precautions / Restrictions Precautions Precautions: None Restrictions Weight Bearing Restrictions: No      Mobility  Bed Mobility Overal bed mobility: Independent                  Transfers Overall transfer level: Modified independent                      Ambulation/Gait Ambulation/Gait assistance: Modified independent (Device/Increase time) Gait Distance (Feet): 120 Feet Assistive device: None Gait Pattern/deviations: WFL(Within Functional Limits) Gait velocity: slightly decreased     General Gait Details: grossly WFL with good return for ambulating in room, hallway without loss of balance  Stairs            Wheelchair Mobility    Modified Rankin (Stroke Patients Only)       Balance Overall balance assessment: No apparent balance deficits (not formally assessed)                                           Pertinent Vitals/Pain Pain Assessment Pain Assessment: No/denies pain    Home Living Family/patient expects to be discharged to:: Private residence Living Arrangements: Children;Spouse/significant other Available Help at Discharge: Family;Available PRN/intermittently Type of Home: House Home Access: Stairs to enter   Entrance Stairs-Number of Steps: 2-3   Home Layout: One level        Prior Function Prior Level of Function :  Independent/Modified Independent             Mobility Comments: Community ambulator without AD, does not drive ADLs Comments: Independent     Hand Dominance   Dominant Hand: Right    Extremity/Trunk Assessment   Upper Extremity Assessment Upper Extremity Assessment: Overall WFL for tasks assessed    Lower Extremity Assessment Lower Extremity Assessment: Overall WFL for tasks assessed    Cervical / Trunk Assessment Cervical / Trunk Assessment: Normal  Communication   Communication: Expressive  difficulties (patient not speaking at this time)  Cognition Arousal/Alertness: Awake/alert Behavior During Therapy: WFL for tasks assessed/performed Overall Cognitive Status: Within Functional Limits for tasks assessed                                 General Comments: follows directions consistently        General Comments      Exercises     Assessment/Plan    PT Assessment Patient does not need any further PT services  PT Problem List         PT Treatment Interventions      PT Goals (Current goals can be found in the Care Plan section)  Acute Rehab PT Goals Patient Stated Goal: not stated PT Goal Formulation: With patient/family Time For Goal Achievement: 12/08/22 Potential to Achieve Goals: Good    Frequency       Co-evaluation               AM-PAC PT "6 Clicks" Mobility  Outcome Measure Help needed turning from your back to your side while in a flat bed without using bedrails?: None Help needed moving from lying on your back to sitting on the side of a flat bed without using bedrails?: None Help needed moving to and from a bed to a chair (including a wheelchair)?: None Help needed standing up from a chair using your arms (e.g., wheelchair or bedside chair)?: None Help needed to walk in hospital room?: None Help needed climbing 3-5 steps with a railing? : None 6 Click Score: 24    End of Session   Activity Tolerance: Patient tolerated treatment well Patient left: in bed;with call bell/phone within reach;with family/visitor present Nurse Communication: Mobility status PT Visit Diagnosis: Unsteadiness on feet (R26.81);Other abnormalities of gait and mobility (R26.89);Muscle weakness (generalized) (M62.81)    Time: [id number] PT Time Calculation (min) (ACUTE ONLY): 30 min   Charges:   PT Evaluation $PT Eval Moderate Complexity: 1 Mod PT Treatments $Therapeutic Activity: 23-37 mins        12:28 PM, 12/08/22 Lonell Grandchild,  MPT Physical Therapist with [facility] [phone] office [phone] mobile phone

## 2022-12-09 ENCOUNTER — Ambulatory Visit: Payer: BLUE CROSS/BLUE SHIELD | Admitting: Neurology

## 2022-12-09 DIAGNOSIS — F1210 Cannabis abuse, uncomplicated: Secondary | ICD-10-CM

## 2022-12-09 DIAGNOSIS — F05 Delirium due to known physiological condition: Secondary | ICD-10-CM

## 2022-12-09 LAB — BASIC METABOLIC PANEL
Anion gap: 10 (ref 5–15)
BUN: 13 mg/dL (ref 6–20)
CO2: 26 mmol/L (ref 22–32)
Calcium: 8.6 mg/dL — ABNORMAL LOW (ref 8.9–10.3)
Chloride: 100 mmol/L (ref 98–111)
Creatinine, Ser: 0.62 mg/dL (ref 0.44–1.00)
GFR, Estimated: >60 mL/min (ref >60)
Glucose, Bld: 102 mg/dL — ABNORMAL HIGH (ref 70–99)
Potassium: 2.8 mmol/L — ABNORMAL LOW (ref 3.5–5.1)
Sodium: 136 mmol/L (ref 135–145)

## 2022-12-09 LAB — CBC
HCT: 33.8 % — ABNORMAL LOW (ref 36.0–46.0)
Hemoglobin: 11.7 g/dL — ABNORMAL LOW (ref 12.0–15.0)
MCH: 31.6 pg (ref 26.0–34.0)
MCHC: 34.6 g/dL (ref 30.0–36.0)
MCV: 91.4 fL (ref 80.0–100.0)
Platelets: 245 10*3/uL (ref 150–400)
RBC: 3.7 MIL/uL — ABNORMAL LOW (ref 3.87–5.11)
RDW: 13.3 % (ref 11.5–15.5)
WBC: 10.9 10*3/uL — ABNORMAL HIGH (ref 4.0–10.5)
nRBC: 0 % (ref 0.0–0.2)

## 2022-12-09 LAB — POTASSIUM: Potassium: 3.3 mmol/L — ABNORMAL LOW (ref 3.5–5.1)

## 2022-12-09 MED ORDER — BACLOFEN 10 MG PO TABS: 10.0000 mg | ORAL_TABLET | Freq: Two times a day (BID) | ORAL | 0 refills | Status: AC | PRN

## 2022-12-09 MED ORDER — POTASSIUM CHLORIDE CRYS ER 20 MEQ PO TBCR
40.0000 meq | EXTENDED_RELEASE_TABLET | Freq: Once | ORAL | Status: AC
  Administered 2022-12-09: 40 meq via ORAL
  Filled 2022-12-09: qty 2

## 2022-12-09 MED ORDER — LORAZEPAM 1 MG PO TABS: 2.0000 mg | ORAL_TABLET | Freq: Once | ORAL | Status: DC

## 2022-12-09 MED ORDER — OXCARBAZEPINE 600 MG PO TABS: 600.0000 mg | ORAL_TABLET | Freq: Two times a day (BID) | ORAL | 1 refills | Status: DC

## 2022-12-09 MED ORDER — LORAZEPAM 1 MG PO TABS
1.0000 mg | ORAL_TABLET | Freq: Once | ORAL | Status: AC
  Administered 2022-12-09: 1 mg via ORAL
  Filled 2022-12-09: qty 1

## 2022-12-09 MED ORDER — LORAZEPAM 2 MG PO TABS: 1.0000 mg | ORAL_TABLET | Freq: Two times a day (BID) | ORAL | 0 refills | Status: AC | PRN

## 2022-12-09 MED ORDER — POTASSIUM CHLORIDE 10 MEQ/100ML IV SOLN
10.0000 meq | INTRAVENOUS | Status: AC
  Administered 2022-12-09 (×5): 10 meq via INTRAVENOUS
  Filled 2022-12-09: qty 100

## 2022-12-09 NOTE — Evaluation (Signed)
Clinical/Bedside Swallow Evaluation Patient Details  Name: Mckenzie Baker MRN: [id number] Date of Birth: December 27, 1975  Today's Date: 12/09/2022 Time: SLP Start Time (ACUTE ONLY): 12 SLP Stop Time (ACUTE ONLY): 1615 SLP Time Calculation (min) (ACUTE ONLY): 22 min  Past Medical History:  Past Medical History:  Diagnosis Date   Anxiety    Bell's palsy    Chronic back pain    Depression    Fibromyalgia    GERD (gastroesophageal reflux disease)    Headache    History of IBS    Junctional rhythm    Second degree AV block, Mobitz type I    Seizures (HCC)    Sinus bradycardia    Wears glasses    Past Surgical History:  Past Surgical History:  Procedure Laterality Date   DILATION AND CURETTAGE OF UTERUS     LUMBAR LAMINECTOMY/DECOMPRESSION MICRODISCECTOMY Bilateral 03/30/2013   Procedure: LUMBAR LAMINECTOMY/DECOMPRESSION MICRODISCECTOMY 1 LEVEL;  Surgeon: Hosie Spangle, MD;  Location: [facility];  Service: Neurosurgery;  Laterality: Bilateral;  bilateral L45 laminotomy and microdiskectomy   TOOTH EXTRACTION     HPI:  Patient is a 47 year old female with history of seizure disorder, autonomic dysfunction, chronic pain syndrome who presented here with confusion, nausea/vomiting and concern for seizure episode.  As per the report, she was not taking her antiseizure medication for last few days.  Patient was found to be confused.  On presentation ,she was hemodynamically stable.  Lab work showed WBC of 19.9.  UDS positive for benzodiazepines, opiates, THC.  CT head did not show any acute intracranial  abnormalities.  CT abdomen/pelvis showed distended urinary bladder, chronic changes of the spine.  Hospital course remarkable for persistent postictal state.  MRI did not show any acute intracranial findings.  Neurology, psychiatry consulted.  Currently, patient's mental status has returned to baseline and she is fully alert and oriented.  As per the daughter, she has poor oral intake and  difficulty with swallowing.We have consulted speech therapy ,waiting for evaluation.    Assessment / Plan / Recommendation  Clinical Impression  Clinical swallow evaluation completed at bedside. Oral motor examination is WNL. Pt self presented water via cup/straw, puree, and regular textures and exhibits no signs of aspiration or reports of globus. Pt reports difficulty swallowing has resolved (gagged some yesterday), but still reports diminished appetite which has been present prior to admission. SLP suggested that she keep a food log and follow up with her PCP regarding diminished appetite. No further SLP services indicated. SLP will sign off. SLP Visit Diagnosis: Dysphagia, unspecified (R13.10)    Aspiration Risk  No limitations    Diet Recommendation Regular;Thin liquid   Liquid Administration via: Cup;Straw Medication Administration: Whole meds with liquid Supervision: Patient able to self feed Postural Changes: Seated upright at 90 degrees;Remain upright for at least 30 minutes after po intake    Other  Recommendations Oral Care Recommendations: Oral care BID    Recommendations for follow up therapy are one component of a multi-disciplinary discharge planning process, led by the attending physician.  Recommendations may be updated based on patient status, additional functional criteria and insurance authorization.  Follow up Recommendations No SLP follow up      Assistance Recommended at Discharge    Functional Status Assessment Patient has not had a recent decline in their functional status  Frequency and Duration            Prognosis Prognosis for improved oropharyngeal function: Good      Swallow  Study   General Date of Onset: 12/06/22 HPI: Patient is a 47 year old female with history of seizure disorder, autonomic dysfunction, chronic pain syndrome who presented here with confusion, nausea/vomiting and concern for seizure episode.  As per the report, she was not taking  her antiseizure medication for last few days.  Patient was found to be confused.  On presentation ,she was hemodynamically stable.  Lab work showed WBC of 19.9.  UDS positive for benzodiazepines, opiates, THC.  CT head did not show any acute intracranial  abnormalities.  CT abdomen/pelvis showed distended urinary bladder, chronic changes of the spine.  Hospital course remarkable for persistent postictal state.  MRI did not show any acute intracranial findings.  Neurology, psychiatry consulted.  Currently, patient's mental status has returned to baseline and she is fully alert and oriented.  As per the daughter, she has poor oral intake and difficulty with swallowing.We have consulted speech therapy ,waiting for evaluation. Type of Study: Bedside Swallow Evaluation Diet Prior to this Study: Regular;Thin liquids (Level 0) Temperature Spikes Noted: No Respiratory Status: Room air History of Recent Intubation: No Behavior/Cognition: Alert;Cooperative;Pleasant mood Oral Cavity Assessment: Within Functional Limits Oral Care Completed by SLP: No Oral Cavity - Dentition: Adequate natural dentition Vision: Functional for self-feeding Self-Feeding Abilities: Able to feed self Patient Positioning: Upright in bed Baseline Vocal Quality: Normal Volitional Cough: Strong Volitional Swallow: Able to elicit    Oral/Motor/Sensory Function Overall Oral Motor/Sensory Function: Within functional limits   Ice Chips Ice chips: Within functional limits Presentation: Spoon   Thin Liquid Thin Liquid: Within functional limits Presentation: Cup;Self Fed;Straw    Nectar Thick Nectar Thick Liquid: Not tested   Honey Thick Honey Thick Liquid: Not tested   Puree Puree: Within functional limits Presentation: Spoon   Solid     Solid: Within functional limits Presentation: Self Fed     Thank you,  Genene Churn, [place]  [name] 12/09/2022,4:25 PM

## 2022-12-09 NOTE — Progress Notes (Addendum)
PROGRESS NOTE  Mckenzie Baker  [id number] DOB: 1976/05/28 DOA: 12/06/2022 PCP: Lemmie Evens, MD   Brief Narrative: Patient is a 47 year old female with history of seizure disorder, autonomic dysfunction, chronic pain syndrome who presented here with confusion, nausea/vomiting and concern for seizure episode.  As per the report, she was not taking her antiseizure medication for last few days.  Patient was found to be confused.  On presentation ,she was hemodynamically stable.  Lab work showed WBC of 19.9.  UDS positive for benzodiazepines, opiates, THC.  CT head did not show any acute intracranial  abnormalities.  CT abdomen/pelvis showed distended urinary bladder, chronic changes of the spine.  Hospital course remarkable for persistent postictal state.  MRI did not show any acute intracranial findings.  Neurology, psychiatry consulted.  Currently, patient's mental status has returned to baseline and she is fully alert and oriented.  As per the daughter, she has poor oral intake and difficulty with swallowing.We have consulted speech therapy ,waiting for evaluation.  Assessment & Plan:  Principal Problem:   Acute encephalopathy Active Problems:   Seizures (HCC)   Autonomic dysfunction   Acute metabolic encephalopathy   Acute metabolic encephalopathy secondary to seizures: History of seizure disorder.  Noncompliant with outpatient antiepileptics.  Follows with Dr. Delice Lesch as an outpatient.  UA not suspicious for UTI.  CT head did not show  any acute findings.  UDS positive for benzo, opiates, THC.  EEG was done here: Showed mild diffuse encephalopathy, no seizure or epileptiform discharge.  Home antiseizure medication oxcarbazepine is started here.  Continue seizure precaution. She remained  aphasic, staring blankly on 2/28.  As per the daughter, no history of psychiatric problems.  MRI of the brain did not show any acute findings.  Neurology consulted.  Given a dose of Ativan 2 mg for  suspicion of catatonia.  Psychiatry also consulted.  Currently declared she does not meet criteria for inpatient psychiatric admission.  Patient is fully alert and oriented today.  Nausea/vomiting/bladder distention: CT abdomen/pelvis showed distended urinary bladder.  Today abdomen is nondistended.  Clear Urine is draining on pu reek ,no problem with voiding  Leukocytosis: WBC of 19.9 on admission.  Likely reactive in the setting of seizure.  Patient is afebrile.  Urinalysis not positive for UTI.  CT abdomen/pelvis also nonrevealing.  WC count improved  History of autoimmune dysfunction/secondary AV block: Follows with electrophysiology for persistent bradycardia.  EP recommended increase fluid/salt intake, consideration of midodrine/Florinef.  We recommend outpatient follow-up with cardiology/EP.  Depression: Continue Cymbalta  Marijuana use: Counseled cessation.  UDS positive for benzodiazepines, THC, opiates  Back pain/decreased mobiity:  PT evaluation done,no follow up recommended  Hypokalemia: Being supplemented with  potassium  Dysphagia: Patient's daughter states she has poor oral intake and difficulty with swallowing.  Nutrition is already following.  Speech therapy consulted ,waiting for evaluation   Addendum: Patient seen by speech therapy,recommended regular diet,thin liquid,no follow up.Discharge planned  at around 6:30 pm,but as per RN ,patient doesn't feel comfortable going home this evening,and wants to be discharged in AM  Nutrition Problem: Inadequate oral intake Etiology:  (altered mental status)    DVT prophylaxis:enoxaparin (LOVENOX) injection 40 mg Start: 12/07/22 1000     Code Status: Full Code  Family Communication: Discussed with daughter at bedside  Patient status: In patient  Patient is from : Home  Anticipated discharge to: Home  Estimated DC date: after speech therapy evaluation   Consultants: Neurology,psychiatry  Procedures:  None  Antimicrobials:  Anti-infectives (  From admission, onward)    Start     Dose/Rate Route Frequency Ordered Stop   12/06/22 2145  cefTRIAXone (ROCEPHIN) 1 g in sodium chloride 0.9 % 100 mL IVPB        1 g 200 mL/hr over 30 Minutes Intravenous  Once 12/06/22 2138 12/06/22 2339       Subjective:  Patient seen and examined at bedside today.  Hemodynamically stable.  She looks totally different today.  Alert and oriented, denied any problems.She says her mood is stable  Objective: Vitals:   12/08/22 1312 12/08/22 2109 12/09/22 0332 12/09/22 1428  BP: (!) 147/85 (!) 147/92 130/68 (!) 144/95  Pulse: 72 69 65 69  Resp:  18 14   Temp: 99 F (37.2 C) 98.7 F (37.1 C) 98.6 F (37 C) 98.4 F (36.9 C)  TempSrc: Oral     SpO2: 100% 97% 97% 99%  Weight:      Height:        Intake/Output Summary (Last 24 hours) at 12/09/2022 1433 Last data filed at 12/09/2022 0900 Gross per 24 hour  Intake 600 ml  Output 300 ml  Net 300 ml   Filed Weights   12/07/22 1425  Weight: 72.6 kg    Examination:  General exam: Overall comfortable, not in distress HEENT: PERRL Respiratory system:  no wheezes or crackles  Cardiovascular system: S1 & S2 heard, RRR.  Gastrointestinal system: Abdomen is nondistended, soft and nontender. Central nervous system: Alert and oriented Extremities: No edema, no clubbing ,no cyanosis Skin: No rashes, no ulcers,no icterus     Data Reviewed: I have personally reviewed following labs and imaging studies  CBC: Recent Labs  Lab 12/06/22 1854 12/07/22 0518 12/08/22 0404 12/09/22 0357  WBC 19.9* 15.4* 13.5* 10.9*  NEUTROABS 17.9*  --   --   --   HGB 13.7 12.7 12.8 11.7*  HCT 39.5 37.2 37.6 33.8*  MCV 92.1 93.0 93.5 91.4  PLT 405* 357 283 [id number]   Basic Metabolic Panel: Recent Labs  Lab 12/06/22 1854 12/07/22 0518 12/08/22 0404 12/09/22 0357  NA 141 140 138 136  K 3.5 4.1 3.2* 2.8*  CL 102 105 104 100  CO2 '25 23 23 26  '$ GLUCOSE 117* 116* 98 102*   BUN '17 18 18 13  '$ CREATININE 0.96 0.99 0.69 0.62  CALCIUM 10.1 9.4 9.2 8.6*  MG 2.7*  --  2.1  --      No results found for this or any previous visit (from the past 240 hour(s)).   Radiology Studies: MR BRAIN WO CONTRAST  Result Date: 12/08/2022 CLINICAL DATA:  Encephalopathy (Ped 0-17y) EXAM: MRI HEAD WITHOUT CONTRAST TECHNIQUE: Multiplanar, multiecho pulse sequences of the brain and surrounding structures were obtained without intravenous contrast. COMPARISON:  CT head December 06, 2022. FINDINGS: Brain: No acute infarction, hemorrhage, hydrocephalus, extra-axial collection or mass lesion. Vascular: Major arterial flow voids are maintained at the skull base. Skull and upper cervical spine: Normal marrow signal. Sinuses/Orbits: Right maxillary sinus retention cyst. Otherwise, largely clear sinuses. No acute orbital findings. Other: No mastoid effusions. IMPRESSION: Normal brain MRI.  No acute abnormality. Electronically Signed   By: Margaretha Sheffield M.D.   On: 12/08/2022 09:25   EEG adult  Result Date: 12/07/2022 Lora Havens, MD     12/07/2022  4:18 PM Patient Name: NAFIA MIESSE MRN: [id number] Epilepsy Attending: Lora Havens Referring Physician/Provider: Vianne Bulls, MD Date: 12/07/2022 Duration: 22.32 mins Patient history:  48 y.o. female with  past medical history significant for seizure disorder, autonomic dysfunction, chronic pain syndrome who presented to [facility] ED on 2/26 via EMS with confusion, nausea/vomiting.   EEG to evaluate for seizure Level of alertness: Awake AEDs during EEG study: None Technical aspects: This EEG study was done with scalp electrodes positioned according to the 10-20 International system of electrode placement. Electrical activity was reviewed with band pass filter of 1-'70Hz'$ , sensitivity of 7 uV/mm, display speed of 29m/sec with a '60Hz'$  notched filter applied as appropriate. EEG data were recorded continuously and digitally stored.  Video  monitoring was available and reviewed as appropriate. Description: The posterior dominant rhythm consists of 7.5 Hz activity of moderate voltage (25-35 uV) seen predominantly in posterior head regions, symmetric and reactive to eye opening and eye closing. Drowsiness was characterized by attenuation of the posterior background rhythm. EEG showed continuous generalized 6 to 7 Hz theta slowing. Hyperventilation and photic stimulation were not performed.   ABNORMALITY - Continuous slow, generalized IMPRESSION: This study is suggestive of mild diffuse encephalopathy, nonspecific etiology. No seizures or epileptiform discharges were seen throughout the recording. Please note lack of epileptiform activity during interictal EEG does not exclude the diagnosis of epilepsy. Priyanka OBarbra Sarks   Scheduled Meds:  DULoxetine  30 mg Oral Daily   enoxaparin (LOVENOX) injection  40 mg Subcutaneous Q24H   feeding supplement  237 mL Oral BID BM   Oxcarbazepine  600 mg Oral BID   sodium chloride flush  3 mL Intravenous Q12H   Continuous Infusions:  potassium chloride 10 mEq (12/09/22 1345)     LOS: 2 days   AShelly Coss MD [facility] P2/29/2024, 2:33 PM

## 2022-12-09 NOTE — Consult Note (Signed)
Telepsych Consultation   Reason for Consult:  psych consult Referring Physician:  Shelly Coss, MD  Location of Patient:  [facility] [phone] Location of Provider: [facility] Department  Patient Identification: Mckenzie Baker MRN:  [id number] Principal Diagnosis: Acute encephalopathy Diagnosis:  Principal Problem:   Acute encephalopathy Active Problems:   Seizures ([place])   Autonomic dysfunction   Acute metabolic encephalopathy   Total Time spent with patient: 45 minutes  Subjective:   Mckenzie Baker is a 47 y.o. female patient admitted with acute encephalopathy.  HPI: Mckenzie Baker is a 47 year old female with history of seizure disorder, autonomic dysfunction, second-degree AV block, chronic pain syndrome who presented to [facility] 12/06/22 via EMS with confusion, nausea/vomiting and concern for seizure episode. Per initial report, patient not taking prescribed antiseizure medication for 3 days. Patient was found to be confused, non-verbal. Lab work showed WBC of 19.9. UDS positive for benzodiazepines, opiates, THC; otherwise hemodynamically stable. CT head did not show any acute intracranial abnormalities. CT abdomen/pelvis showed distended urinary bladder, chronic changes of the spine.  Hospital course remarkable for persistent postictal state. Neurology consulted; EEG performed, didn't show any [place] interictal abnormality. MRI unremarkable.   24 hour chart review: patient received Lorazepam 2 mg IV for r/o catatonia based on mutism, change in orientation. She is now alert and oriented, able to verbalize needs.   Assessment: Patient assessed via telemedicine where she presents laying in bed with daughter Mckenzie Baker at the bedside. She is alert and oriented x4. She is able to verbalize name, birthdate, today's date, place, and situation. Speech is clear. Eye contact appropriate. Thought contents appear clear and coherent. No delusional thought content noted. She reports reason for  admission as, 'I remember Sunday evening eating at my daughter's house and I had a sudden seizure' after being out of seizure medication x3 days'.   She reports medical history of seizures beginning in 2022; no history prior to. Denies any illicit substance use outside of daily marijuana use, however per chart review daughter previously reported history of polysubstance abuse. Reports history of Adderall, oxycodone use; denies any history of overuse. Reports confusion after her 2nd big seizure where she didn't recognize her husband 'for a few days'; states effects of most recent seizure were most drastic.   Today she reports feeling 'slow' mentally with ongoing issues remembering things and some weakness. Poor appetite. Feels some panic rating it 5-7/10 as it relates to her daughter having to 'deal with her health'. She denies any feelings of depression or thoughts of suicide. She is able to contract for safety. Denies any HI/AVH.   Collateral: Ayden (daughter) at the bedside Reports mom has been visiting and hadn't had medication x3 days prior to incident. States mom has only had 3 'big ones' regarding seizures with the first one occurring in late 2022; unsure of any circumstances that precipitated incident. Feels mom had a seizure, however no one witnessed incident. Denies any concerns regarding mom's mental health. States mom appears at baseline at this time, however has expressed concern regarding extent of seizure in which she plans to follow up with medical team.   Past Psychiatric History: anxiety, depression  Risk to Self: pt denies Risk to Others: pt denies Prior Inpatient Therapy: pt denies Prior Outpatient Therapy: pt denies  Past Medical History:  Past Medical History:  Diagnosis Date   Anxiety    Bell's palsy    Chronic back pain    Depression    Fibromyalgia  GERD (gastroesophageal reflux disease)    Headache    History of IBS    Junctional rhythm    Second degree AV  block, Mobitz type I    Seizures (HCC)    Sinus bradycardia    Wears glasses     Past Surgical History:  Procedure Laterality Date   DILATION AND CURETTAGE OF UTERUS     LUMBAR LAMINECTOMY/DECOMPRESSION MICRODISCECTOMY Bilateral 03/30/2013   Procedure: LUMBAR LAMINECTOMY/DECOMPRESSION MICRODISCECTOMY 1 LEVEL;  Surgeon: Hosie Spangle, MD;  Location: [facility];  Service: Neurosurgery;  Laterality: Bilateral;  bilateral L45 laminotomy and microdiskectomy   TOOTH EXTRACTION     Family History:  Family History  Problem Relation Age of Onset   Cancer Father    Alcoholism Father    Diabetes Sister    Family Psychiatric History: not noted Social History:  Social History   Substance and Sexual Activity  Alcohol Use Not Currently   Comment: occasional     Social History   Substance and Sexual Activity  Drug Use Yes   Types: Marijuana    Social History   Socioeconomic History   Marital status: Married    Spouse name: Not on file   Number of children: Not on file   Years of education: Not on file   Highest education level: Not on file  Occupational History   Not on file  Tobacco Use   Smoking status: Former    Packs/day: 1.00    Years: 20.00    Total pack years: 20.00    Types: Cigarettes, E-cigarettes    Quit date: 08/11/2021    Years since quitting: 1.3   Smokeless tobacco: Never   Tobacco comments:    Used to vape more often.  Vapes 2-3 times per day.  Vaping Use   Vaping Use: Every day  Substance and Sexual Activity   Alcohol use: Not Currently    Comment: occasional   Drug use: Yes    Types: Marijuana   Sexual activity: Not Currently  Other Topics Concern   Not on file  Social History Narrative   Right handed    Social Determinants of Health   Financial Resource Strain: Not on file  Food Insecurity: No Food Insecurity (12/07/2022)   Hunger Vital Sign    Worried About Running Out of Food in the Last Year: Never true    Ran Out of Food in the Last  Year: Never true  Transportation Needs: No Transportation Needs (12/07/2022)   PRAPARE - Hydrologist (Medical): No    Lack of Transportation (Non-Medical): No  Physical Activity: Not on file  Stress: Not on file  Social Connections: Not on file   Additional Social History: -pt lives in [place]; was in the area visiting daughter  Allergies:   Allergies  Allergen Reactions   Cyclobenzaprine Hives and Other (See Comments)    Bad reaction     Labs:  Results for orders placed or performed during the hospital encounter of 12/06/22 (from the past 48 hour(s))  CBC     Status: Abnormal   Collection Time: 12/08/22  4:04 AM  Result Value Ref Range   WBC 13.5 (H) 4.0 - 10.5 K/uL   RBC 4.02 3.87 - 5.11 MIL/uL   Hemoglobin 12.8 12.0 - 15.0 g/dL   HCT 37.6 36.0 - 46.0 %   MCV 93.5 80.0 - 100.0 fL   MCH 31.8 26.0 - 34.0 pg   MCHC 34.0 30.0 - 36.0  g/dL   RDW 13.7 11.5 - 15.5 %   Platelets 283 150 - 400 K/uL   nRBC 0.0 0.0 - 0.2 %    Comment: Performed at [facility], [address], [place], [place] [id number]  Basic metabolic panel     Status: Abnormal   Collection Time: 12/08/22  4:04 AM  Result Value Ref Range   Sodium 138 135 - 145 mmol/L   Potassium 3.2 (L) 3.5 - 5.1 mmol/L   Chloride 104 98 - 111 mmol/L   CO2 23 22 - 32 mmol/L   Glucose, Bld 98 70 - 99 mg/dL    Comment: Glucose reference range applies only to samples taken after fasting for at least 8 hours.   BUN 18 6 - 20 mg/dL   Creatinine, Ser 0.69 0.44 - 1.00 mg/dL   Calcium 9.2 8.9 - 10.3 mg/dL   GFR, Estimated >60 >60 mL/min    Comment: (NOTE) Calculated using the CKD-EPI Creatinine Equation (2021)    Anion gap 11 5 - 15    Comment: Performed at [facility], [address], [place], [place] [address]  Magnesium     Status: None   Collection Time: 12/08/22  4:04 AM  Result Value Ref Range   Magnesium 2.1 1.7 - 2.4 mg/dL    Comment: Performed at [facility], [address],  [place], [place] [address]  CBC     Status: Abnormal   Collection Time: 12/09/22  3:57 AM  Result Value Ref Range   WBC 10.9 (H) 4.0 - 10.5 K/uL   RBC 3.70 (L) 3.87 - 5.11 MIL/uL   Hemoglobin 11.7 (L) 12.0 - 15.0 g/dL   HCT 33.8 (L) 36.0 - 46.0 %   MCV 91.4 80.0 - 100.0 fL   MCH 31.6 26.0 - 34.0 pg   MCHC 34.6 30.0 - 36.0 g/dL   RDW 13.3 11.5 - 15.5 %   Platelets 245 150 - 400 K/uL   nRBC 0.0 0.0 - 0.2 %    Comment: Performed at [facility], [address] [place], [place] [id number]  Basic metabolic panel     Status: Abnormal   Collection Time: 12/09/22  3:57 AM  Result Value Ref Range   Sodium 136 135 - 145 mmol/L   Potassium 2.8 (L) 3.5 - 5.1 mmol/L   Chloride 100 98 - 111 mmol/L   CO2 26 22 - 32 mmol/L   Glucose, Bld 102 (H) 70 - 99 mg/dL    Comment: Glucose reference range applies only to samples taken after fasting for at least 8 hours.   BUN 13 6 - 20 mg/dL   Creatinine, Ser 0.62 0.44 - 1.00 mg/dL   Calcium 8.6 (L) 8.9 - 10.3 mg/dL   GFR, Estimated >60 >60 mL/min    Comment: (NOTE) Calculated using the CKD-EPI Creatinine Equation (2021)    Anion gap 10 5 - 15    Comment: Performed at [facility], [address], [place], [place] [address]    Medications:  Current Facility-Administered Medications  Medication Dose Route Frequency Provider Last Rate Last Admin   acetaminophen (TYLENOL) tablet 650 mg  650 mg Oral Q6H PRN Opyd, Ilene Qua, MD       Or   acetaminophen (TYLENOL) suppository 650 mg  650 mg Rectal Q6H PRN Opyd, Ilene Qua, MD       DULoxetine (CYMBALTA) DR capsule 30 mg  30 mg Oral Daily Opyd, Ilene Qua, MD   30 mg at 12/09/22 [id number]  enoxaparin (LOVENOX) injection 40 mg  40 mg Subcutaneous Q24H Opyd, Ilene Qua, MD   40 mg at 12/09/22 [id number]   feeding supplement (ENSURE ENLIVE / ENSURE PLUS) liquid 237 mL  237 mL Oral BID BM Adhikari, Amrit, MD   237 mL at 12/09/22 0829   ondansetron (ZOFRAN) tablet 4 mg  4 mg Oral Q6H PRN Opyd, Ilene Qua, MD       Or   ondansetron  (ZOFRAN) injection 4 mg  4 mg Intravenous Q6H PRN Opyd, Ilene Qua, MD       Oxcarbazepine (TRILEPTAL) tablet 600 mg  600 mg Oral BID Opyd, Ilene Qua, MD   600 mg at 12/09/22 [id number]   polyethylene glycol (MIRALAX / GLYCOLAX) packet 17 g  17 g Oral Daily PRN Opyd, Ilene Qua, MD       potassium chloride 10 mEq in 100 mL IVPB  10 mEq Intravenous Q1 Hr x 5 Adhikari, Amrit, MD 100 mL/hr at 12/09/22 0944 10 mEq at 12/09/22 0944   sodium chloride flush (NS) 0.9 % injection 3 mL  3 mL Intravenous Q12H Opyd, Ilene Qua, MD   3 mL at 12/09/22 0945    Musculoskeletal: Strength & Muscle Tone: within normal limits Gait & Station: normal Patient leans: N/A  Psychiatric Specialty Exam:  Presentation  General Appearance: Casual  Eye Contact:Good  Speech:Clear and Coherent  Speech Volume:Normal  Handedness:Right   Mood and Affect  Mood:Euthymic  Affect:Congruent; Flat   Thought Process  Thought Processes:Coherent; Linear  Descriptions of Associations:Intact  Orientation:Full (Time, Place and Person)  Thought Content:Logical  History of Schizophrenia/Schizoaffective disorder:No data recorded Duration of Psychotic Symptoms:No data recorded Hallucinations:Hallucinations: None  Ideas of Reference:None  Suicidal Thoughts:Suicidal Thoughts: No  Homicidal Thoughts:Homicidal Thoughts: No   Sensorium  Memory:Immediate Good; Recent Fair  Judgment:Fair  Insight:Fair   Executive Functions  Concentration:Fair  Attention Span:Fair  [place]   Psychomotor Activity  Psychomotor Activity:Psychomotor Activity: Normal   Assets  Assets:Communication Skills; Desire for Improvement; Physical Health; Social Support; Resilience   Sleep  Sleep:Sleep: Good    Physical Exam: Physical Exam Vitals and nursing note reviewed.  Constitutional:      Appearance: She is normal weight.  HENT:     Head: Normocephalic.     Nose: Nose normal.      Mouth/Throat:     Mouth: Mucous membranes are moist.     Pharynx: Oropharynx is clear.  Eyes:     Pupils: Pupils are equal, round, and reactive to light.  Cardiovascular:     Rate and Rhythm: Normal rate.     Pulses: Normal pulses.  Pulmonary:     Effort: Pulmonary effort is normal.     Breath sounds: Normal breath sounds.  Abdominal:     Palpations: Abdomen is soft.  Musculoskeletal:        General: Normal range of motion.     Cervical back: Normal range of motion.  Skin:    General: Skin is warm and dry.  Neurological:     Mental Status: She is alert and oriented to person, place, and time. Mental status is at baseline.  Psychiatric:        Attention and Perception: Attention and perception normal.        Mood and Affect: Mood and affect normal.        Speech: Speech normal.        Behavior: Behavior normal. Behavior is cooperative.  Thought Content: Thought content normal.        Cognition and Memory: Cognition and memory normal.        Judgment: Judgment normal.    Review of Systems  Neurological:  Positive for seizures.  Psychiatric/Behavioral:  Positive for substance abuse.    Blood pressure 130/68, pulse 65, temperature 98.6 F (37 C), resp. rate 14, height '5\' 9"'$  (1.753 m), weight 72.6 kg, SpO2 97 %. Body mass index is 23.64 kg/m.  Treatment Plan Summary: Daily contact with patient to assess and evaluate symptoms and progress in treatment, Medication management, and Plan   Plan:  Medications:  -Pt received one time dose of Lorazepam 2 mg IV 12/08/22 for suspicions of catatonia (mutism, altered mental status). Patient responded positively and is now alert, oriented, and verbal. Does not appear to require any further Ativan doses at this time.   Labs:  UDS+ benzodiazepines, opiates, THC; PDMP reviewed, no active prescriptions at this time.   Disposition: No evidence of imminent risk to self or others at present.   Patient does not meet criteria for  psychiatric inpatient admission. Supportive therapy provided about ongoing stressors. Discussed crisis plan, support from social network, calling 911, coming to the Emergency Department, and calling Suicide Hotline. This patient is currently not a threat to herself or anyone else. She denies any thoughts of wanting to harm herself or anyone. Contracts for safety. She has had no delusional thoughts or psychotic behaviors. Psychiatry will sign off at this time. Will be available for any future medication recommendations.   This service was provided via telemedicine using a 2-way, interactive audio and video technology.  Names of all persons participating in this telemedicine service and their role in this encounter. Name: Oneida Alar Role: PMHNP  Name: Hampton Abbot Role: Attending MD  Name: Mckenzie Baker Role: patient  Name: Mckenzie Baker Role: daughter    Inda Merlin, NP 12/09/2022 11:28 AM

## 2022-12-10 ENCOUNTER — Other Ambulatory Visit: Payer: Self-pay | Admitting: Neurology

## 2022-12-10 LAB — BASIC METABOLIC PANEL
Anion gap: 7 (ref 5–15)
BUN: 8 mg/dL (ref 6–20)
CO2: 24 mmol/L (ref 22–32)
Calcium: 8.6 mg/dL — ABNORMAL LOW (ref 8.9–10.3)
Chloride: 103 mmol/L (ref 98–111)
Creatinine, Ser: 0.58 mg/dL (ref 0.44–1.00)
GFR, Estimated: >60 mL/min (ref >60)
Glucose, Bld: 93 mg/dL (ref 70–99)
Potassium: 3.5 mmol/L (ref 3.5–5.1)
Sodium: 134 mmol/L — ABNORMAL LOW (ref 135–145)

## 2022-12-10 NOTE — Progress Notes (Signed)
Pt does not meet inpatient behavioral health placement per Inda Merlin, NP. CSW will now remove pt from the [facility] shift report. TOC will assist with any discharge needs.  Benjaman Kindler, MSW, [facility] 12/10/2022 12:35 AM

## 2022-12-10 NOTE — Hospital Course (Signed)
Brief Narrative: Patient is a 47 year old female with history of seizure disorder, autonomic dysfunction, chronic pain syndrome who presented here with confusion, nausea/vomiting and concern for seizure episode.  As per the report, she was not taking her antiseizure medication for last few days.  Patient was found to be confused.  On presentation ,she was hemodynamically stable.  Lab work showed WBC of 19.9.  UDS positive for benzodiazepines, opiates, THC.  CT head did not show any acute intracranial  abnormalities.  CT abdomen/pelvis showed distended urinary bladder, chronic changes of the spine.  Hospital course remarkable for persistent postictal state.  MRI did not show any acute intracranial findings.  Neurology, psychiatry consulted.  Currently, patient's mental status has returned to baseline and she is fully alert and oriented.  As per the daughter, she has poor oral intake and difficulty with swallowing.We have consulted speech therapy ,waiting for evaluation.   Assessment & Plan:   Principal Problem:   Acute encephalopathy Active Problems:   Seizures (HCC)   Autonomic dysfunction   Acute metabolic encephalopathy     Acute metabolic encephalopathy secondary to seizures: History of seizure disorder.  Noncompliant with outpatient antiepileptics.  Follows with Dr. Delice Lesch as an outpatient.  UA not suspicious for UTI.  CT head did not show  any acute findings.  UDS positive for benzo, opiates, THC.  EEG was done here: Showed mild diffuse encephalopathy, no seizure or epileptiform discharge.  Home antiseizure medication oxcarbazepine is started here.  Continue seizure precaution. She remained  aphasic, staring blankly on 2/28.  As per the daughter, no history of psychiatric problems.   MRI of the brain did not show any acute findings.   Neurology consulted.  Given a dose of Ativan 2 mg for suspicion of catatonia.   Psychiatry also consulted.  Currently declared she does not meet criteria for  inpatient psychiatric admission.  Patient is fully alert and oriented today. Cleared for D/C by psych.    Nausea/vomiting/bladder distention: CT abdomen/pelvis showed distended urinary bladder.  Today abdomen is nondistended.  Clear Urine is draining on pu reek ,no problem with voiding   Leukocytosis: WBC of 19.9 on admission.  Likely reactive in the setting of seizure.  Patient is afebrile.  Urinalysis not positive for UTI.  CT abdomen/pelvis also nonrevealing.  WC count improved   History of autoimmune dysfunction/secondary AV block: Follows with electrophysiology for persistent bradycardia.  EP recommended increase fluid/salt intake, consideration of midodrine/Florinef.  We recommend outpatient follow-up with cardiology/EP.   Depression: Continue Cymbalta   Marijuana use: Counseled cessation.  UDS positive for benzodiazepines, THC, opiates   Back pain/decreased mobiity:  PT evaluation done,no follow up recommended   Hypokalemia: Being supplemented with  potassium   Dysphagia: Patient's daughter states she has poor oral intake and difficulty with swallowing.  Nutrition is already following.  Speech therapy consulted ,waiting for evaluation     Addendum: Patient seen by speech therapy,recommended regular diet,thin liquid,no follow up.Discharge planned  at around 6:30 pm,but as per RN ,patient doesn't feel comfortable going home this evening,and wants to be discharged in AM   Nutrition Problem: Inadequate oral intake Etiology:  (altered mental status)     DVT prophylaxis:enoxaparin (LOVENOX) injection 40 mg Start: 12/07/22 1000  Code Status: Full Code   Family Communication: Discussed with daughter at bedside   Patient status: In patient   Patient is from : Home Anticipated discharge to: Home  Estimated DC date: after speech therapy evaluation     Consultants: Neurology,psychiatry

## 2022-12-10 NOTE — Progress Notes (Signed)
Pt discharged via [place] to [facility] with all personal belongings in her possession, including her bottle of medication (Trileptal).

## 2022-12-10 NOTE — Discharge Summary (Signed)
Physician Discharge Summary   Patient: Mckenzie Baker MRN: [id number] DOB: [birth year]  Admit date:     12/06/2022  Discharge date: 12/10/22  Discharge Physician: Deatra James   PCP: Lemmie Evens, MD   Recommendations at discharge:   Follow-up with neurology in 1-2 weeks Follow-up with PCP in 2-4 weeks  Follow-up with psych in 2-4 weeks  Discharge Diagnoses: Principal Problem:   Acute encephalopathy Active Problems:   Seizures ([place])   Autonomic dysfunction   Acute metabolic encephalopathy  Resolved Problems:   * No resolved hospital problems. *  Hospital Course: Brief Narrative: Patient is a 47 year old female with history of seizure disorder, autonomic dysfunction, chronic pain syndrome who presented here with confusion, nausea/vomiting and concern for seizure episode.  As per the report, she was not taking her antiseizure medication for last few days.  Patient was found to be confused.  On presentation ,she was hemodynamically stable.  Lab work showed WBC of 19.9.  UDS positive for benzodiazepines, opiates, THC.  CT head did not show any acute intracranial  abnormalities.  CT abdomen/pelvis showed distended urinary bladder, chronic changes of the spine.  Hospital course remarkable for persistent postictal state.  MRI did not show any acute intracranial findings.  Neurology, psychiatry consulted.  Currently, patient's mental status has returned to baseline and she is fully alert and oriented.  As per the daughter, she has poor oral intake and difficulty with swallowing.We have consulted speech therapy ,waiting for evaluation.   Assessment & Plan:   Principal Problem:   Acute encephalopathy Active Problems:   Seizures (HCC)   Autonomic dysfunction   Acute metabolic encephalopathy     Acute metabolic encephalopathy secondary to seizures: History of seizure disorder.  Noncompliant with outpatient antiepileptics.  Follows with Dr. Delice Lesch as an outpatient.  UA not  suspicious for UTI.  CT head did not show  any acute findings.  UDS positive for benzo, opiates, THC.  EEG was done here: Showed mild diffuse encephalopathy, no seizure or epileptiform discharge.  Home antiseizure medication oxcarbazepine is started here.  Continue seizure precaution. She remained  aphasic, staring blankly on 2/28.  As per the daughter, no history of psychiatric problems.   MRI of the brain did not show any acute findings.   Neurology consulted.  Given a dose of Ativan 2 mg for suspicion of catatonia.   Psychiatry also consulted.  Currently declared she does not meet criteria for inpatient psychiatric admission.  Patient is fully alert and oriented today. Cleared for D/C by psych.    Nausea/vomiting/bladder distention: CT abdomen/pelvis showed distended urinary bladder.  Today abdomen is nondistended.  Clear Urine is draining on pu reek ,no problem with voiding   Leukocytosis: WBC of 19.9 on admission.  Likely reactive in the setting of seizure.  Patient is afebrile.  Urinalysis not positive for UTI.  CT abdomen/pelvis also nonrevealing.  WC count improved   History of autoimmune dysfunction/secondary AV block: Follows with electrophysiology for persistent bradycardia.  EP recommended increase fluid/salt intake, consideration of midodrine/Florinef.  We recommend outpatient follow-up with cardiology/EP.   Depression: Continue Cymbalta   Marijuana use: Counseled cessation.  UDS positive for benzodiazepines, THC, opiates   Back pain/decreased mobiity:  PT evaluation done,no follow up recommended   Hypokalemia: Being supplemented with  potassium   Dysphagia: Patient's daughter states she has poor oral intake and difficulty with swallowing.  Nutrition is already following.  Speech therapy consulted ,waiting for evaluation     Addendum: Patient seen  by speech therapy,recommended regular diet,thin liquid,no follow up.Discharge planned  at around 6:30 pm,but as per RN ,patient doesn't  feel comfortable going home this evening,and wants to be discharged in AM   Nutrition Problem: Inadequate oral intake Etiology:  (altered mental status)     DVT prophylaxis:enoxaparin (LOVENOX) injection 40 mg Start: 12/07/22 1000  Code Status: Full Code   Family Communication: Discussed with daughter at bedside   Patient status: In patient   Patient is from : Home Anticipated discharge to: Home  Estimated DC date: after speech therapy evaluation     Consultants: Neurology,psychiatry    Assessment and Plan: No notes have been filed under this hospital service. Service: Hospitalist        Consultants: Neurology/psych Procedures performed: EEG/MRI/CT of the head Disposition: Home Diet recommendation:  Regular diet DISCHARGE MEDICATION: Allergies as of 12/10/2022       Reactions   Cyclobenzaprine Hives, Other (See Comments)   Bad reaction        Medication List     STOP taking these medications    levETIRAcetam 1000 MG tablet Commonly known as: KEPPRA       TAKE these medications    baclofen 10 MG tablet Commonly known as: LIORESAL Take 1 tablet (10 mg total) by mouth every 12 (twelve) hours as needed for muscle spasms. What changed:  when to take this reasons to take this   DULoxetine 30 MG capsule Commonly known as: CYMBALTA Take 30 mg by mouth daily.   LORazepam 2 MG tablet Commonly known as: ATIVAN Take 0.5 tablets (1 mg total) by mouth every 12 (twelve) hours as needed for anxiety. What changed:  how much to take when to take this reasons to take this   oxcarbazepine 600 MG tablet Commonly known as: TRILEPTAL Take 1 tablet (600 mg total) by mouth 2 (two) times daily. What changed:  medication strength how much to take how to take this when to take this additional instructions   oxyCODONE-acetaminophen 10-325 MG tablet Commonly known as: PERCOCET Take 1 tablet by mouth every 4 (four) hours as needed for pain.         Follow-up Information     Lemmie Evens, MD. Schedule an appointment as soon as possible for a visit in 1 week(s).   Specialty: Family Medicine Contact information: [place] [id number] [phone]                Discharge Exam: Danley Danker Weights   12/07/22 1425  Weight: 72.6 kg   General:  AAO x 3,  cooperative, no distress;   HEENT:  Normocephalic, PERRL, otherwise with in Normal limits   Neuro:  CNII-XII intact. , normal motor and sensation, reflexes intact   Lungs:   Clear to auscultation BL, Respirations unlabored,  No wheezes / crackles  Cardio:    S1/S2, RRR, No murmure, No Rubs or Gallops   Abdomen:  Soft, non-tender, bowel sounds active all four quadrants, no guarding or peritoneal signs.  Muscular  skeletal:  Limited exam -global generalized weaknesses - in bed, able to move all 4 extremities,   2+ pulses,  symmetric, No pitting edema  Skin:  Dry, warm to touch, negative for any Rashes,  Wounds: Please see nursing documentation          Condition at discharge: good  The results of significant diagnostics from this hospitalization (including imaging, microbiology, ancillary and laboratory) are listed below for reference.   Imaging Studies: MR BRAIN WO  CONTRAST  Result Date: 12/08/2022 CLINICAL DATA:  Encephalopathy (Ped 0-17y) EXAM: MRI HEAD WITHOUT CONTRAST TECHNIQUE: Multiplanar, multiecho pulse sequences of the brain and surrounding structures were obtained without intravenous contrast. COMPARISON:  CT head December 06, 2022. FINDINGS: Brain: No acute infarction, hemorrhage, hydrocephalus, extra-axial collection or mass lesion. Vascular: Major arterial flow voids are maintained at the skull base. Skull and upper cervical spine: Normal marrow signal. Sinuses/Orbits: Right maxillary sinus retention cyst. Otherwise, largely clear sinuses. No acute orbital findings. Other: No mastoid effusions. IMPRESSION: Normal brain MRI.  No acute  abnormality. Electronically Signed   By: Margaretha Sheffield M.D.   On: 12/08/2022 09:25   EEG adult  Result Date: 12/07/2022 Lora Havens, MD     12/07/2022  4:18 PM Patient Name: SHAKAIRA VILLAVERDE MRN: [id number] Epilepsy Attending: Lora Havens Referring Physician/Provider: Vianne Bulls, MD Date: 12/07/2022 Duration: 22.32 mins Patient history:  47 y.o. female with past medical history significant for seizure disorder, autonomic dysfunction, chronic pain syndrome who presented to [facility] ED on 2/26 via EMS with confusion, nausea/vomiting.   EEG to evaluate for seizure Level of alertness: Awake AEDs during EEG study: None Technical aspects: This EEG study was done with scalp electrodes positioned according to the 10-20 International system of electrode placement. Electrical activity was reviewed with band pass filter of 1-'70Hz'$ , sensitivity of 7 uV/mm, display speed of 74m/sec with a '60Hz'$  notched filter applied as appropriate. EEG data were recorded continuously and digitally stored.  Video monitoring was available and reviewed as appropriate. Description: The posterior dominant rhythm consists of 7.5 Hz activity of moderate voltage (25-35 uV) seen predominantly in posterior head regions, symmetric and reactive to eye opening and eye closing. Drowsiness was characterized by attenuation of the posterior background rhythm. EEG showed continuous generalized 6 to 7 Hz theta slowing. Hyperventilation and photic stimulation were not performed.   ABNORMALITY - Continuous slow, generalized IMPRESSION: This study is suggestive of mild diffuse encephalopathy, nonspecific etiology. No seizures or epileptiform discharges were seen throughout the recording. Please note lack of epileptiform activity during interictal EEG does not exclude the diagnosis of epilepsy. PLora Havens  CT ABDOMEN PELVIS W CONTRAST  Result Date: 12/07/2022 CLINICAL DATA:  Vomiting. EXAM: CT ABDOMEN AND PELVIS WITH CONTRAST  TECHNIQUE: Multidetector CT imaging of the abdomen and pelvis was performed using the standard protocol following bolus administration of intravenous contrast. RADIATION DOSE REDUCTION: This exam was performed according to the departmental dose-optimization program which includes automated exposure control, adjustment of the mA and/or kV according to patient size and/or use of iterative reconstruction technique. CONTRAST:  1067mOMNIPAQUE IOHEXOL 300 MG/ML  SOLN COMPARISON:  None Available. FINDINGS: Lower chest: No acute abnormality. Hepatobiliary: No focal liver abnormality is seen. No gallstones, gallbladder wall thickening, or biliary dilatation. Pancreas: Unremarkable. No pancreatic ductal dilatation or surrounding inflammatory changes. Spleen: Normal in size without focal abnormality. Adrenals/Urinary Tract: Adrenal glands are unremarkable. Kidneys are normal, without renal calculi, focal lesion, or hydronephrosis. Urinary bladder is markedly distended. Stomach/Bowel: Stomach is within normal limits. The appendix is not clearly identified. No evidence of bowel wall thickening, distention, or inflammatory changes. Vascular/Lymphatic: Aortic atherosclerosis. No enlarged abdominal or pelvic lymph nodes. Reproductive: Uterus and bilateral adnexa are unremarkable. Other: No abdominal wall hernia or abnormality. No abdominopelvic ascites. Musculoskeletal: Postoperative changes are seen at the levels of L4 and L5. Moderate severity chronic and degenerative changes are seen at the level of L5-S1. IMPRESSION: 1. Markedly distended urinary bladder. 2.  Postoperative changes at the levels of L4 and L5. 3. Moderate severity chronic and degenerative changes at the level of L5-S1. 4. Aortic atherosclerosis. Aortic Atherosclerosis (ICD10-I70.0). Electronically Signed   By: Virgina Norfolk M.D.   On: 12/07/2022 01:22   CT Head Wo Contrast  Result Date: 12/06/2022 CLINICAL DATA:  Altered mental status EXAM: CT HEAD  WITHOUT CONTRAST TECHNIQUE: Contiguous axial images were obtained from the base of the skull through the vertex without intravenous contrast. RADIATION DOSE REDUCTION: This exam was performed according to the departmental dose-optimization program which includes automated exposure control, adjustment of the mA and/or kV according to patient size and/or use of iterative reconstruction technique. COMPARISON:  06/09/2021 head CT FINDINGS: Brain: Preserved brain volume and gray-white matter differentiation. No midline shift, mass effect or hydrocephalus. No abnormal intra or extra-axial fluid collections are identified. Vascular: No hyperdense vessel or unexpected calcification. Skull: Lung windows do not demonstrate a displaced or depressed skull fracture. Sinuses/Orbits: Visualized paranasal sinuses and mastoid air cells are clear. Globes are intact. Other: None. IMPRESSION: No acute intracranial abnormality. Electronically Signed   By: Jill Side M.D.   On: 12/06/2022 19:30    Microbiology: Results for orders placed or performed during the hospital encounter of 06/09/21  Resp Panel by RT-PCR (Flu A&B, Covid) Nasopharyngeal Swab     Status: None   Collection Time: 06/09/21  8:54 PM   Specimen: Nasopharyngeal Swab; Nasopharyngeal(NP) swabs in vial transport medium  Result Value Ref Range Status   SARS Coronavirus 2 by RT PCR NEGATIVE NEGATIVE Final    Comment: (NOTE) SARS-CoV-2 target nucleic acids are NOT DETECTED.  The SARS-CoV-2 RNA is generally detectable in upper respiratory specimens during the acute phase of infection. The lowest concentration of SARS-CoV-2 viral copies this assay can detect is 138 copies/mL. A negative result does not preclude SARS-Cov-2 infection and should not be used as the sole basis for treatment or other patient management decisions. A negative result may occur with  improper specimen collection/handling, submission of specimen other than nasopharyngeal swab, presence  of viral mutation(s) within the areas targeted by this assay, and inadequate number of viral copies(<138 copies/mL). A negative result must be combined with clinical observations, patient history, and epidemiological information. The expected result is Negative.  Fact Sheet for Patients:  EntrepreneurPulse.com.au  Fact Sheet for Healthcare Providers:  IncredibleEmployment.be  This test is no t yet approved or cleared by the [place] FDA and  has been authorized for detection and/or diagnosis of SARS-CoV-2 by FDA under an Emergency Use Authorization (EUA). This EUA will remain  in effect (meaning this test can be used) for the duration of the COVID-19 declaration under Section 564(b)(1) of the Act, 21 U.S.C.section 360bbb-3(b)(1), unless the authorization is terminated  or revoked sooner.       Influenza A by PCR NEGATIVE NEGATIVE Final   Influenza B by PCR NEGATIVE NEGATIVE Final    Comment: (NOTE) The Xpert Xpress SARS-CoV-2/FLU/RSV plus assay is intended as an aid in the diagnosis of influenza from Nasopharyngeal swab specimens and should not be used as a sole basis for treatment. Nasal washings and aspirates are unacceptable for Xpert Xpress SARS-CoV-2/FLU/RSV testing.  Fact Sheet for Patients: EntrepreneurPulse.com.au  Fact Sheet for Healthcare Providers: IncredibleEmployment.be  This test is not yet approved or cleared by the [place] FDA and has been authorized for detection and/or diagnosis of SARS-CoV-2 by FDA under an Emergency Use Authorization (EUA). This EUA will remain in effect (meaning this test can be  used) for the duration of the COVID-19 declaration under Section 564(b)(1) of the Act, 21 U.S.C. section 360bbb-3(b)(1), unless the authorization is terminated or revoked.  Performed at [facility], [address] [place], [place] [address]   MRSA Next Gen by PCR, Nasal      Status: None   Collection Time: 06/10/21 11:31 AM   Specimen: Nasal Mucosa; Nasal Swab  Result Value Ref Range Status   MRSA by PCR Next Gen NOT DETECTED NOT DETECTED Final    Comment: (NOTE) The GeneXpert MRSA Assay (FDA approved for NASAL specimens only), is one component of a comprehensive MRSA colonization surveillance program. It is not intended to diagnose MRSA infection nor to guide or monitor treatment for MRSA infections. Test performance is not FDA approved in patients less than 74 years old. Performed at [facility], [address], [place], [place] [address]     Labs: CBC: Recent Labs  Lab 12/06/22 1854 12/07/22 0518 12/08/22 0404 12/09/22 0357  WBC 19.9* 15.4* 13.5* 10.9*  NEUTROABS 17.9*  --   --   --   HGB 13.7 12.7 12.8 11.7*  HCT 39.5 37.2 37.6 33.8*  MCV 92.1 93.0 93.5 91.4  PLT 405* 357 283 [id number]   Basic Metabolic Panel: Recent Labs  Lab 12/06/22 1854 12/07/22 0518 12/08/22 0404 12/09/22 0357 12/09/22 1518 12/10/22 0353  NA 141 140 138 136  --  134*  K 3.5 4.1 3.2* 2.8* 3.3* 3.5  CL 102 105 104 100  --  103  CO2 '25 23 23 26  '$ --  24  GLUCOSE 117* 116* 98 102*  --  93  BUN '17 18 18 13  '$ --  8  CREATININE 0.96 0.99 0.69 0.62  --  0.58  CALCIUM 10.1 9.4 9.2 8.6*  --  8.6*  MG 2.7*  --  2.1  --   --   --    Liver Function Tests: Recent Labs  Lab 12/06/22 1854  AST 25  ALT 20  ALKPHOS 79  BILITOT 1.0  PROT 8.8*  ALBUMIN 5.2*   CBG: Recent Labs  Lab 12/06/22 1810  GLUCAP 128*    Discharge time spent: greater than 40 minutes.  Signed: Deatra James, MD [facility] 12/10/2022

## 2022-12-10 NOTE — Progress Notes (Addendum)
Pt ambulating with minimal assist during this writers shift in room and in hallway. Pt alert and oriented and back to baseline. All needs met. Plan of care ongoing.

## 2023-01-18 ENCOUNTER — Telehealth: Payer: Self-pay | Admitting: Internal Medicine

## 2023-01-18 ENCOUNTER — Encounter: Payer: Self-pay | Admitting: Internal Medicine

## 2023-01-18 ENCOUNTER — Ambulatory Visit: Payer: BLUE CROSS/BLUE SHIELD | Attending: Internal Medicine | Admitting: Internal Medicine

## 2023-01-18 VITALS — BP 100/72 | HR 104 | Ht 69.0 in | Wt 144.8 lb

## 2023-01-18 DIAGNOSIS — G909 Disorder of the autonomic nervous system, unspecified: Secondary | ICD-10-CM

## 2023-01-18 MED ORDER — MIDODRINE HCL 5 MG PO TABS: 5.0000 mg | ORAL_TABLET | Freq: Two times a day (BID) | ORAL | 3 refills | Status: DC

## 2023-01-18 NOTE — Patient Instructions (Signed)
Medication Instructions:  Your physician has recommended you make the following change in your medication:   Start Midodrine 5 mg Two Times Daily   *If you need a refill on your cardiac medications before your next appointment, please call your pharmacy*   Lab Work: NONE   If you have labs (blood work) drawn today and your tests are completely normal, you will receive your results only by: MyChart Message (if you have MyChart) OR A paper copy in the mail If you have any lab test that is abnormal or we need to change your treatment, we will call you to review the results.   Testing/Procedures: NONE    Follow-Up: At [facility], you and your health needs are our priority.  As part of our continuing mission to provide you with exceptional heart care, we have created designated Provider Care Teams.  These Care Teams include your primary Cardiologist (physician) and Advanced Practice Providers (APPs -  Physician Assistants and Nurse Practitioners) who all work together to provide you with the care you need, when you need it.  We recommend signing up for the patient portal called "MyChart".  Sign up information is provided on this After Visit Summary.  MyChart is used to connect with patients for Virtual Visits (Telemedicine).  Patients are able to view lab/test results, encounter notes, upcoming appointments, etc.  Non-urgent messages can be sent to your provider as well.   To learn more about what you can do with MyChart, go to [email or web address].    Your next appointment:   6 month(s)  Provider:   Lewayne Bunting, MD    Other Instructions Thank you for choosing [facility]!

## 2023-01-18 NOTE — Telephone Encounter (Signed)
  Pt c/o medication issue:  1. Name of Medication: midodrine (PROAMATINE) 5 MG tablet   2. How are you currently taking this medication (dosage and times per day)? Take 1 tablet (5 mg total) by mouth 2 (two) times daily with a meal.   3. Are you having a reaction (difficulty breathing--STAT)?no   4. What is your medication issue? Pt said, this medication was sent to the wrong pharmacy, she is requesting to resend it to [facility] at  [address] [place], [place], [place] [address]

## 2023-01-18 NOTE — Progress Notes (Signed)
HPI Mr. Weinberg returns today for followup. She is a pleasant 47 yo woman with a h/o altered mentation who was thought to have seizures and diagnosed with seizures and placed on Keppra. 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 Since I saw her last she has had several episodes of syncope with typical neurally mediated features.. She has lost another 10 lbs and admits to not eating enough, especially salt. She also has been hospitalized with encephalopathy. She had been off of her seizure meds and there was a question of an associate partial seizure. Her bp has run low. When I spoke to her last, I considered adding midodrine.   Allergies  Allergen Reactions   Cyclobenzaprine Hives and Other (See Comments)    Bad reaction      Current Outpatient Medications  Medication Sig Dispense Refill   baclofen (LIORESAL) 10 MG tablet Take 1 tablet (10 mg total) by mouth every 12 (twelve) hours as needed for muscle spasms. 30 each 0   DULoxetine (CYMBALTA) 30 MG capsule Take 30 mg by mouth daily.     LORazepam (ATIVAN) 2 MG tablet Take 0.5 tablets (1 mg total) by mouth every 12 (twelve) hours as needed for anxiety. 30 tablet 0   Oxcarbazepine (TRILEPTAL) 600 MG tablet Take 1 tablet (600 mg total) by mouth 2 (two) times daily. 60 tablet 1   oxyCODONE-acetaminophen (PERCOCET) 10-325 MG tablet Take 1 tablet by mouth every 4 (four) hours as needed for pain.     No current facility-administered medications for this visit.     Past Medical History:  Diagnosis Date   Anxiety    Bell's palsy    Chronic back pain    Depression    Fibromyalgia    GERD (gastroesophageal reflux disease)    Headache    History of IBS    Junctional rhythm    Second degree AV block, Mobitz type I    Seizures    Sinus bradycardia    Wears glasses     ROS:   All systems reviewed and negative except as noted in the HPI.   Past Surgical History:  Procedure Laterality Date   DILATION  AND CURETTAGE OF UTERUS     LUMBAR LAMINECTOMY/DECOMPRESSION MICRODISCECTOMY Bilateral 03/30/2013   Procedure: LUMBAR LAMINECTOMY/DECOMPRESSION MICRODISCECTOMY 1 LEVEL;  Surgeon: Hewitt Shorts, MD;  Location: [facility];  Service: Neurosurgery;  Laterality: Bilateral;  bilateral L45 laminotomy and microdiskectomy   TOOTH EXTRACTION       Family History  Problem Relation Age of Onset   Cancer Father    Alcoholism Father    Diabetes Sister      Social History   Socioeconomic History   Marital status: Married    Spouse name: Not on file   Number of children: Not on file   Years of education: Not on file   Highest education level: Not on file  Occupational History   Not on file  Tobacco Use   Smoking status: Former    Packs/day: 1.00    Years: 20.00    Additional pack years: 0.00    Total pack years: 20.00    Types: Cigarettes, E-cigarettes    Quit date: 08/11/2021    Years since quitting: 1.4   Smokeless tobacco: Never   Tobacco comments:    Stopped vaping Nov 2022  Vaping Use   Vaping Use: Former  Substance and Sexual Activity   Alcohol use: Not  Currently    Comment: occasional   Drug use: Yes    Types: Marijuana   Sexual activity: Not Currently  Other Topics Concern   Not on file  Social History Narrative   Right handed    Social Determinants of Health   Financial Resource Strain: Not on file  Food Insecurity: No Food Insecurity (12/07/2022)   Hunger Vital Sign    Worried About Running Out of Food in the Last Year: Never true    Ran Out of Food in the Last Year: Never true  Transportation Needs: No Transportation Needs (12/07/2022)   PRAPARE - Administrator, Civil Service (Medical): No    Lack of Transportation (Non-Medical): No  Physical Activity: Not on file  Stress: Not on file  Social Connections: Not on file  Intimate Partner Violence: Not At Risk (12/07/2022)   Humiliation, Afraid, Rape, and Kick questionnaire    Fear of Current or  Ex-Partner: No    Emotionally Abused: No    Physically Abused: No    Sexually Abused: No     BP 100/72   Pulse (!) 104   Ht 5\' 9"  (1.753 m)   Wt 144 lb 12.8 oz (65.7 kg)   SpO2 99%   BMI 21.38 kg/m   Physical Exam:  Well appearing NAD HEENT: Unremarkable Neck:  No JVD, no thyromegally Lymphatics:  No adenopathy Back:  No CVA tenderness Lungs:  Clear with no wheezes HEART:  Regular rate rhythm, no murmurs, no rubs, no clicks Abd:  soft, positive bowel sounds, no organomegally, no rebound, no guarding Ext:  2 plus pulses, no edema, no cyanosis, no clubbing Skin:  No rashes no nodules Neuro:  CN II through XII intact, motor grossly intact  Assess/Plan:  Autonomic dysfunction - I discussed the importance of increasing salt and fluid. We discussed adding additional meds to increase her bp.  I will try midodrine 5 mg twice daily and I have encouraged her to gain weight and eat more salty food. AVWB - of no clinical significance at this point.    Sharlot Gowda Marsha Hillman,MD

## 2023-01-18 NOTE — Telephone Encounter (Signed)
Completed.

## 2023-01-24 ENCOUNTER — Ambulatory Visit (INDEPENDENT_AMBULATORY_CARE_PROVIDER_SITE_OTHER): Payer: BLUE CROSS/BLUE SHIELD | Admitting: Neurology

## 2023-01-24 ENCOUNTER — Encounter: Payer: Self-pay | Admitting: Neurology

## 2023-01-24 VITALS — BP 134/80 | HR 95 | Ht 69.0 in | Wt 141.8 lb

## 2023-01-24 DIAGNOSIS — G40909 Epilepsy, unspecified, not intractable, without status epilepticus: Secondary | ICD-10-CM

## 2023-01-24 MED ORDER — OXCARBAZEPINE 600 MG PO TABS: ORAL_TABLET | ORAL | 3 refills | Status: DC
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
## 2023-08-11 ENCOUNTER — Telehealth: Payer: Self-pay | Admitting: Neurology

## 2023-08-11 NOTE — Telephone Encounter (Signed)
Patient said her new medical doctor is decreasing her ativan and possibly pain meds. Since decreasing her ativan, she had 5 days worth of partial seizures. That has since got better, but once she goes back to him, she does not want this to happen again. Her medical doctor wants aquino her seizure meds so this dont happen  Oxcarbazepine 600 AM and 600 at bedtime.  Pharmacy- [facility] in [facility]

## 2023-08-12 MED ORDER — OXCARBAZEPINE 600 MG PO TABS: ORAL_TABLET | ORAL | 3 refills | Status: AC

## 2023-08-12 NOTE — Telephone Encounter (Signed)
Pt called an informed that it is Ok to increase oxcarbazepine 600mg : take 1 and 1/2 tabs BID,

## 2023-08-12 NOTE — Telephone Encounter (Signed)
Ok to increase oxcarbazepine 600mg : take 1 and 1/2 tabs BID, pls send in Rx, thanks

## 2023-08-12 NOTE — Addendum Note (Signed)
Addended by: Dimas Chyle on: 08/12/2023 01:02 PM   Modules accepted: Orders

## 2024-03-06 ENCOUNTER — Other Ambulatory Visit: Payer: Self-pay

## 2024-03-06 MED ORDER — MIDODRINE HCL 5 MG PO TABS: 5.0000 mg | ORAL_TABLET | Freq: Two times a day (BID) | ORAL | 0 refills | Status: DC

## 2024-04-04 ENCOUNTER — Other Ambulatory Visit: Payer: Self-pay | Admitting: Internal Medicine

## 2024-05-11 ENCOUNTER — Other Ambulatory Visit: Payer: Self-pay | Admitting: Internal Medicine

## 2024-05-29 ENCOUNTER — Other Ambulatory Visit: Payer: Self-pay | Admitting: Internal Medicine
